# Patient Record
Sex: Female | Born: 1995 | Race: Black or African American | Hispanic: No | Marital: Single | Smoking: Former smoker
Health system: Southern US, Community
[De-identification: ages and names within clinical notes are randomized; demographics above are authoritative.]

## PROBLEM LIST (undated history)

## (undated) ENCOUNTER — Inpatient Hospital Stay (HOSPITAL_COMMUNITY): Payer: Self-pay

## (undated) ENCOUNTER — Inpatient Hospital Stay: Payer: Self-pay

## (undated) DIAGNOSIS — J45909 Unspecified asthma, uncomplicated: Secondary | ICD-10-CM

## (undated) DIAGNOSIS — B009 Herpesviral infection, unspecified: Secondary | ICD-10-CM

## (undated) HISTORY — PX: ARM WOUND REPAIR / CLOSURE: SUR1141

---
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
## 2016-01-12 ENCOUNTER — Emergency Department (HOSPITAL_COMMUNITY)
Admission: EM | Admit: 2016-01-12 | Discharge: 2016-01-13 | Disposition: A | Discharge status: Home / Self Care | Payer: Medicaid Other | Attending: Emergency Medicine | Admitting: Emergency Medicine

## 2016-01-12 ENCOUNTER — Encounter (HOSPITAL_COMMUNITY): Payer: Self-pay | Admitting: Emergency Medicine

## 2016-01-12 DIAGNOSIS — J45909 Unspecified asthma, uncomplicated: Secondary | ICD-10-CM | POA: Insufficient documentation

## 2016-01-12 DIAGNOSIS — Z87891 Personal history of nicotine dependence: Secondary | ICD-10-CM | POA: Insufficient documentation

## 2016-01-12 DIAGNOSIS — R112 Nausea with vomiting, unspecified: Secondary | ICD-10-CM | POA: Insufficient documentation

## 2016-01-12 DIAGNOSIS — R1033 Periumbilical pain: Secondary | ICD-10-CM

## 2016-01-12 DIAGNOSIS — N9489 Other specified conditions associated with female genital organs and menstrual cycle: Secondary | ICD-10-CM | POA: Insufficient documentation

## 2016-01-12 LAB — URINALYSIS, ROUTINE W REFLEX MICROSCOPIC
BACTERIA UA: NONE SEEN
BILIRUBIN URINE: NEGATIVE
GLUCOSE, UA: NEGATIVE mg/dL
Ketones, ur: 20 mg/dL — AB
LEUKOCYTES UA: NEGATIVE
NITRITE: NEGATIVE
PH: 5 (ref 5.0–8.0)
Protein, ur: 30 mg/dL — AB
SPECIFIC GRAVITY, URINE: 1.029 (ref 1.005–1.030)

## 2016-01-12 LAB — COMPREHENSIVE METABOLIC PANEL
ALT: 15 U/L (ref 14–54)
ANION GAP: 10 (ref 5–15)
AST: 20 U/L (ref 15–41)
Albumin: 4.5 g/dL (ref 3.5–5.0)
Alkaline Phosphatase: 19 U/L — ABNORMAL LOW (ref 38–126)
BILIRUBIN TOTAL: 0.9 mg/dL (ref 0.3–1.2)
BUN: 6 mg/dL (ref 6–20)
CO2: 26 mmol/L (ref 22–32)
Calcium: 9.5 mg/dL (ref 8.9–10.3)
Chloride: 105 mmol/L (ref 101–111)
Creatinine, Ser: 0.75 mg/dL (ref 0.44–1.00)
GFR calc Af Amer: >60 mL/min (ref >60)
Glucose, Bld: 85 mg/dL (ref 65–99)
POTASSIUM: 3.5 mmol/L (ref 3.5–5.1)
Sodium: 141 mmol/L (ref 135–145)
TOTAL PROTEIN: 7.4 g/dL (ref 6.5–8.1)

## 2016-01-12 LAB — HCG, QUANTITATIVE, PREGNANCY

## 2016-01-12 LAB — CBC
HEMATOCRIT: 38.3 % (ref 36.0–46.0)
Hemoglobin: 13.3 g/dL (ref 12.0–15.0)
MCH: 30 pg (ref 26.0–34.0)
MCHC: 34.7 g/dL (ref 30.0–36.0)
MCV: 86.3 fL (ref 78.0–100.0)
Platelets: 252 10*3/uL (ref 150–400)
RBC: 4.44 MIL/uL (ref 3.87–5.11)
RDW: 12.3 % (ref 11.5–15.5)
WBC: 6.9 10*3/uL (ref 4.0–10.5)

## 2016-01-12 LAB — LIPASE, BLOOD: Lipase: 27 U/L (ref 11–51)

## 2016-01-12 LAB — RAPID STREP SCREEN (MED CTR MEBANE ONLY): Streptococcus, Group A Screen (Direct): NEGATIVE

## 2016-01-12 NOTE — ED Triage Notes (Signed)
Patient reports mid abdominal pain with nausea onset today , pt. added sore throat. Denies fever or diarrhea.

## 2016-01-13 MED ORDER — ONDANSETRON HCL 4 MG/2ML IJ SOLN
4.0000 mg | Freq: Once | INTRAMUSCULAR | Status: AC
  Administered 2016-01-13: 4 mg via INTRAVENOUS
  Filled 2016-01-13: qty 2

## 2016-01-13 MED ORDER — GI COCKTAIL ~~LOC~~
30.0000 mL | Freq: Once | ORAL | Status: AC
  Administered 2016-01-13: 30 mL via ORAL
  Filled 2016-01-13: qty 30

## 2016-01-13 MED ORDER — OMEPRAZOLE 20 MG PO CPDR: 20.0000 mg | DELAYED_RELEASE_CAPSULE | Freq: Every day | ORAL | 0 refills | Status: DC

## 2016-01-13 MED ORDER — DICYCLOMINE HCL 10 MG PO CAPS
10.0000 mg | ORAL_CAPSULE | Freq: Once | ORAL | Status: AC
  Administered 2016-01-13: 10 mg via ORAL
  Filled 2016-01-13: qty 1

## 2016-01-13 MED ORDER — ONDANSETRON 4 MG PO TBDP: 4.0000 mg | ORAL_TABLET | Freq: Three times a day (TID) | ORAL | 0 refills | Status: DC | PRN

## 2016-01-13 MED ORDER — DICYCLOMINE HCL 20 MG PO TABS: 20.0000 mg | ORAL_TABLET | Freq: Two times a day (BID) | ORAL | 0 refills | Status: DC

## 2016-01-13 MED ORDER — SODIUM CHLORIDE 0.9 % IV BOLUS (SEPSIS)
1000.0000 mL | Freq: Once | INTRAVENOUS | Status: AC
  Administered 2016-01-13: 1000 mL via INTRAVENOUS

## 2016-01-13 NOTE — ED Provider Notes (Signed)
[facility]EM[facility]Y DEPT Provider Note   CSN: [id number] Arrival date & time: 01/12/16  2105     History   Chief Complaint Chief Complaint  Patient presents with  . Abdominal Pain  . Sore Throat    HPI Jaclyn Owens is a 20 y.o. female.  Jaclyn GuadeloupeYaKwanda Lipinski is a 20 y.o. Female who presents to the ED complaining of midabdominal pain since yesterday with associated nausea and vomiting beginning today. Patient reports her symptoms began with midabdominal pain that is nonradiating and constant. She does report some burping and belching. She tells me today she's had 2 episodes of vomiting with some nausea. No treatments attempted prior to arrival. No previous abdominal surgeries. She did have a recent pregnancy in June. She is not breast-feeding. The patient denies fevers, diarrhea, urinary symptoms, hematuria, cough, chest pain, shortness of breath, hematemesis, hematochezia, vaginal bleeding, vaginal discharge, lower abdominal pain or rashes.   The history is provided by the patient. No language interpreter was used.  Abdominal Pain   Associated symptoms include nausea and vomiting. Pertinent negatives include fever, diarrhea, dysuria, frequency, hematuria and headaches.  Sore Throat  Associated symptoms include abdominal pain. Pertinent negatives include no chest pain, no headaches and no shortness of breath.    Past Medical History:  Diagnosis Date  . Asthma     Patient Active Problem List   Diagnosis Date Noted  . Normal labor and delivery 08/06/2015  . Abdominal pain affecting pregnancy 06/02/2015  . Pregnancy 04/14/2015  . High risk pregnancy with low PAPPA (pregnancy-associated plasma protein A) 02/17/2015  . First trimester screening 02/10/2015    Past Surgical History:  Procedure Laterality Date  . ARM WOUND REPAIR / CLOSURE      OB History    Gravida Para Term Preterm AB Living   1         0   SAB TAB Ectopic Multiple Live Births                   Home  Medications    Prior to Admission medications   Medication Sig Start Date End Date Taking? Authorizing Provider  dicyclomine (BENTYL) 20 MG tablet Take 1 tablet (20 mg total) by mouth 2 (two) times daily. 01/13/16   Everlene FarrierWilliam Akirra Lacerda, PA-C  ibuprofen (ADVIL,MOTRIN) 600 MG tablet Take 1 tablet (600 mg total) by mouth every 6 (six) hours. Patient not taking: Reported on 01/12/2016 08/07/15   Elenora Fenderhelsea C Ward, MD  omeprazole (PRILOSEC) 20 MG capsule Take 1 capsule (20 mg total) by mouth daily. 01/13/16   Everlene FarrierWilliam Emmet Messer, PA-C  ondansetron (ZOFRAN ODT) 4 MG disintegrating tablet Take 1 tablet (4 mg total) by mouth every 8 (eight) hours as needed for nausea or vomiting. 01/13/16   Everlene FarrierWilliam Tiki Tucciarone, PA-C    Family History No family history on file.  Social History Social History  Substance Use Topics  . Smoking status: Former Smoker    Types: Cigarettes  . Smokeless tobacco: Never Used  . Alcohol use No     Allergies   Bee venom   Review of Systems Review of Systems  Constitutional: Negative for chills and fever.  HENT: Negative for congestion.   Eyes: Negative for visual disturbance.  Respiratory: Negative for cough, shortness of breath and wheezing.   Cardiovascular: Negative for chest pain.  Gastrointestinal: Positive for abdominal pain, nausea and vomiting. Negative for blood in stool and diarrhea.  Genitourinary: Negative for difficulty urinating, dysuria, flank pain, frequency, hematuria, pelvic pain, urgency, vaginal bleeding  and vaginal discharge.  Musculoskeletal: Negative for back pain and neck pain.  Skin: Negative for rash.  Neurological: Negative for headaches.     Physical Exam Updated Vital Signs BP 113/84   Pulse 76   Temp 98 F (36.7 C) (Oral)   Resp 19   LMP 01/11/2016   SpO2 99%   Physical Exam  Constitutional: She appears well-developed and well-nourished. No distress.  Nontoxic appearing.  HENT:  Head: Normocephalic and atraumatic.  Mouth/Throat:  Oropharynx is clear and moist.  Eyes: Conjunctivae are normal. Pupils are equal, round, and reactive to light. Right eye exhibits no discharge. Left eye exhibits no discharge.  Neck: Neck supple.  Cardiovascular: Normal rate, regular rhythm, normal heart sounds and intact distal pulses.  Exam reveals no gallop and no friction rub.   No murmur heard. Pulmonary/Chest: Effort normal and breath sounds normal. No respiratory distress. She has no wheezes. She has no rales.  Abdominal: Soft. Bowel sounds are normal. She exhibits no distension and no mass. There is tenderness. There is no rebound and no guarding.  Abdomen is soft. Bowel sounds are present. Patient has mild midabdominal and epigastric abdominal tenderness to palpation. No lower abdominal tenderness to palpation. No peritoneal signs. No CVA or flank tenderness.  Musculoskeletal: She exhibits no edema.  Lymphadenopathy:    She has no cervical adenopathy.  Neurological: She is alert. Coordination normal.  Skin: Skin is warm and dry. Capillary refill takes less than 2 seconds. No rash noted. She is not diaphoretic. No erythema. No pallor.  Psychiatric: She has a normal mood and affect. Her behavior is normal.  Nursing note and vitals reviewed.    ED Treatments / Results  Labs (all labs ordered are listed, but only abnormal results are displayed) Labs Reviewed  COMPREHENSIVE METABOLIC PANEL - Abnormal; Notable for the following:       Result Value   Alkaline Phosphatase 19 (*)    All other components within normal limits  URINALYSIS, ROUTINE W REFLEX MICROSCOPIC - Abnormal; Notable for the following:    Hgb urine dipstick LARGE (*)    Ketones, ur 20 (*)    Protein, ur 30 (*)    Squamous Epithelial / LPF 0-5 (*)    All other components within normal limits  RAPID STREP SCREEN (NOT AT [facility])  CULTURE, GROUP A STREP ([facility])  LIPASE, BLOOD  CBC  HCG, QUANTITATIVE, PREGNANCY    EKG  EKG Interpretation None        Radiology No results found.  Procedures Procedures (including critical care time)  Medications Ordered in ED Medications  sodium chloride 0.9 % bolus 1,000 mL (0 mLs Intravenous Stopped 01/13/16 0112)  ondansetron (ZOFRAN) injection 4 mg (4 mg Intravenous Given 01/13/16 0043)  gi cocktail (Maalox,Lidocaine,Donnatal) (30 mLs Oral Given 01/13/16 0043)  dicyclomine (BENTYL) capsule 10 mg (10 mg Oral Given 01/13/16 0043)     Initial Impression / Assessment and Plan / ED Course  I have reviewed the triage vital signs and the nursing notes.  Pertinent labs & imaging results that were available during my care of the patient were reviewed by me and considered in my medical decision making (see chart for details).  Clinical Course    This is a 20 y.o. Female who presents to the ED complaining of midabdominal pain since yesterday with associated nausea and vomiting beginning today. Patient reports her symptoms began with midabdominal pain that is nonradiating and constant. She does report some burping and belching. She tells me  today she's had 2 episodes of vomiting with some nausea. No treatments attempted prior to arrival. No previous abdominal surgeries. She did have a recent pregnancy in June. She is not breast-feeding.  On exam the patient is afebrile nontoxic appearing. Her abdomen is soft and she has mild mid and epigastric about tenderness to palpation. No right upper quadrant tenderness. No lower abdominal tenderness. No peritoneal signs. Pregnancy test is negative. CBC and CMP are unremarkable. Urinalysis without signs of infection. Lipase is within normal limits. Patient received fluid bolus, Bentyl and GI cocktail. At recheck patient reports she is feeling much better. She is tolerating by mouth. She feels ready for discharge. Suspected an aspect of acid reflux with her pain. No need for imaging today. Will discharge with rx for Zofran, Bentyl and omeprazole. Discussed strict and  specific return precautions. I advised the patient to follow-up with their primary care provider this week. I advised the patient to return to the emergency department with new or worsening symptoms or new concerns. The patient verbalized understanding and agreement with plan.     Final Clinical Impressions(s) / ED Diagnoses   Final diagnoses:  Non-intractable vomiting with nausea, unspecified vomiting type  Periumbilical abdominal pain    New Prescriptions New Prescriptions   DICYCLOMINE (BENTYL) 20 MG TABLET    Take 1 tablet (20 mg total) by mouth 2 (two) times daily.   OMEPRAZOLE (PRILOSEC) 20 MG CAPSULE    Take 1 capsule (20 mg total) by mouth daily.   ONDANSETRON (ZOFRAN ODT) 4 MG DISINTEGRATING TABLET    Take 1 tablet (4 mg total) by mouth every 8 (eight) hours as needed for nausea or vomiting.     Everlene FarrierWilliam Munira Polson, PA-C 01/13/16 0119    Layla MawKristen N Ward, DO 01/13/16 [id number]

## 2016-01-13 NOTE — ED Notes (Signed)
ED Provider at bedside. 

## 2016-01-13 NOTE — ED Notes (Signed)
Pt requested that IV be removed and would like to leave at this time. MD notified.

## 2016-01-15 LAB — CULTURE, GROUP A STREP (THRC)

## 2016-04-10 ENCOUNTER — Emergency Department (HOSPITAL_COMMUNITY)
Admission: EM | Admit: 2016-04-10 | Discharge: 2016-04-11 | Disposition: A | Discharge status: Home / Self Care | Payer: Medicaid Other | Attending: Emergency Medicine | Admitting: Emergency Medicine

## 2016-04-10 ENCOUNTER — Emergency Department (HOSPITAL_COMMUNITY): Payer: Medicaid Other

## 2016-04-10 ENCOUNTER — Encounter (HOSPITAL_COMMUNITY): Payer: Self-pay | Admitting: Emergency Medicine

## 2016-04-10 ENCOUNTER — Other Ambulatory Visit: Payer: Self-pay

## 2016-04-10 DIAGNOSIS — J45909 Unspecified asthma, uncomplicated: Secondary | ICD-10-CM | POA: Diagnosis not present

## 2016-04-10 DIAGNOSIS — R0789 Other chest pain: Secondary | ICD-10-CM | POA: Diagnosis not present

## 2016-04-10 DIAGNOSIS — Z79899 Other long term (current) drug therapy: Secondary | ICD-10-CM | POA: Diagnosis not present

## 2016-04-10 DIAGNOSIS — R079 Chest pain, unspecified: Secondary | ICD-10-CM | POA: Diagnosis present

## 2016-04-10 DIAGNOSIS — Z202 Contact with and (suspected) exposure to infections with a predominantly sexual mode of transmission: Secondary | ICD-10-CM | POA: Diagnosis not present

## 2016-04-10 DIAGNOSIS — Z87891 Personal history of nicotine dependence: Secondary | ICD-10-CM | POA: Insufficient documentation

## 2016-04-10 LAB — URINALYSIS, ROUTINE W REFLEX MICROSCOPIC
Bacteria, UA: NONE SEEN
Bilirubin Urine: NEGATIVE
Glucose, UA: NEGATIVE mg/dL
KETONES UR: 5 mg/dL — AB
Leukocytes, UA: NEGATIVE
Nitrite: NEGATIVE
PH: 6 (ref 5.0–8.0)
Protein, ur: NEGATIVE mg/dL
Specific Gravity, Urine: 1.025 (ref 1.005–1.030)

## 2016-04-10 LAB — I-STAT TROPONIN, ED
TROPONIN I, POC: 0 ng/mL (ref 0.00–0.08)
Troponin i, poc: 0 ng/mL (ref 0.00–0.08)

## 2016-04-10 LAB — I-STAT BETA HCG BLOOD, ED (MC, WL, AP ONLY)

## 2016-04-10 LAB — BASIC METABOLIC PANEL
Anion gap: 11 (ref 5–15)
BUN: 6 mg/dL (ref 6–20)
CALCIUM: 9 mg/dL (ref 8.9–10.3)
CO2: 23 mmol/L (ref 22–32)
CREATININE: 0.72 mg/dL (ref 0.44–1.00)
Chloride: 106 mmol/L (ref 101–111)
Glucose, Bld: 86 mg/dL (ref 65–99)
Potassium: 3.4 mmol/L — ABNORMAL LOW (ref 3.5–5.1)
SODIUM: 140 mmol/L (ref 135–145)

## 2016-04-10 LAB — CBC
HCT: 36.4 % (ref 36.0–46.0)
Hemoglobin: 12.4 g/dL (ref 12.0–15.0)
MCH: 28.9 pg (ref 26.0–34.0)
MCHC: 34.1 g/dL (ref 30.0–36.0)
MCV: 84.8 fL (ref 78.0–100.0)
PLATELETS: 222 10*3/uL (ref 150–400)
RBC: 4.29 MIL/uL (ref 3.87–5.11)
RDW: 12.7 % (ref 11.5–15.5)
WBC: 5.2 10*3/uL (ref 4.0–10.5)

## 2016-04-10 LAB — D-DIMER, QUANTITATIVE: D-Dimer, Quant: 0.36 ug/mL-FEU (ref 0.00–0.50)

## 2016-04-10 MED ORDER — GI COCKTAIL ~~LOC~~
30.0000 mL | Freq: Once | ORAL | Status: AC
  Administered 2016-04-10: 30 mL via ORAL
  Filled 2016-04-10: qty 30

## 2016-04-10 NOTE — ED Notes (Signed)
Pt stated she felt hot. Pt temp 99.2

## 2016-04-10 NOTE — ED Notes (Signed)
ED Provider at bedside. 

## 2016-04-10 NOTE — ED Notes (Signed)
Attempted to obtain pts vitals, pt did not respond when called. x3.

## 2016-04-10 NOTE — ED Triage Notes (Signed)
Has had cp x 1 month and it comes and goes today it hurts to take a deep breath and she needs checked for std states her partner states that he had been unfaithful, states no s/s  Denies d/c or dysuria

## 2016-04-11 MED ORDER — OMEPRAZOLE 20 MG PO CPDR: 20.0000 mg | DELAYED_RELEASE_CAPSULE | Freq: Every day | ORAL | 0 refills | Status: DC

## 2016-04-11 MED ORDER — ACETAMINOPHEN 325 MG PO TABS: 650.0000 mg | ORAL_TABLET | Freq: Four times a day (QID) | ORAL | 0 refills | Status: DC | PRN

## 2016-04-11 NOTE — ED Notes (Signed)
Pt verbalized understanding of d/c instructions and has no further questions. Pt is stable, A&Ox4, VSS.  

## 2016-04-11 NOTE — ED Notes (Signed)
[phone] pt's personal number.

## 2016-04-11 NOTE — Discharge Instructions (Signed)
We saw you in the ER for the chest pain.  All of our cardiac workup is normal, including labs, EKG and chest X-RAY are normal. We also screened you for a blood clot - and that test is normal as well.  We are not sure what is causing your discomfort, but we feel comfortable sending you home at this time. The workup in the ER is not complete, and you should follow up with your primary care doctor for further evaluation.

## 2016-04-12 NOTE — ED Provider Notes (Signed)
[facility] Provider Note   CSN: [id number] Arrival date & time: 04/10/16  1431     History   Chief Complaint Chief Complaint  Patient presents with  . Chest Pain  . Exposure to STD    HPI Jaclyn Owens is a 21 y.o. female.  HPI Pt comes in with cc of chest pain. Pt reports that she has been having anterior chest pain, and back pain for the past few days. Pain is severe. Pt has some dib associated with it. Pt has no hx of PE, DVT and denies any exogenous hormone (testosterone / estrogen) use, long distance travels or surgery in the past 6 weeks, active cancer, recent immobilization. Pt doesn't use cocaine.   It wasn't until after her discharge that I noted that pt also had complained of wanting to get checked for STD. She never brought this concern to me during her ER stay, and so I never questioned her about her concerns for STD or checked her for STD.  Past Medical History:  Diagnosis Date  . Asthma     Patient Active Problem List   Diagnosis Date Noted  . Normal labor and delivery 08/06/2015  . Abdominal pain affecting pregnancy 06/02/2015  . Pregnancy 04/14/2015  . High risk pregnancy with low PAPPA (pregnancy-associated plasma protein A) 02/17/2015  . First trimester screening 02/10/2015    Past Surgical History:  Procedure Laterality Date  . ARM WOUND REPAIR / CLOSURE      OB History    Gravida Para Term Preterm AB Living   1         0   SAB TAB Ectopic Multiple Live Births                   Home Medications    Prior to Admission medications   Medication Sig Start Date End Date Taking? Authorizing Provider  acetaminophen (TYLENOL) 325 MG tablet Take 2 tablets (650 mg total) by mouth every 6 (six) hours as needed. 04/11/16   Derwood KaplanAnkit Carren Blakley, MD  dicyclomine (BENTYL) 20 MG tablet Take 1 tablet (20 mg total) by mouth 2 (two) times daily. Patient not taking: Reported on 04/10/2016 01/13/16   Everlene FarrierWilliam Dansie, PA-C  ibuprofen (ADVIL,MOTRIN) 600 MG  tablet Take 1 tablet (600 mg total) by mouth every 6 (six) hours. Patient not taking: Reported on 04/10/2016 08/07/15   Elenora Fenderhelsea C Ward, MD  omeprazole (PRILOSEC) 20 MG capsule Take 1 capsule (20 mg total) by mouth daily. 04/11/16   Derwood KaplanAnkit Reyaan Thoma, MD  ondansetron (ZOFRAN ODT) 4 MG disintegrating tablet Take 1 tablet (4 mg total) by mouth every 8 (eight) hours as needed for nausea or vomiting. Patient not taking: Reported on 04/10/2016 01/13/16   Everlene FarrierWilliam Dansie, PA-C    Family History No family history on file.  Social History Social History  Substance Use Topics  . Smoking status: Former Smoker    Types: Cigarettes  . Smokeless tobacco: Never Used  . Alcohol use No     Allergies   Bee venom   Review of Systems Review of Systems  ROS 10 Systems reviewed and are negative for acute change except as noted in the HPI.     Physical Exam Updated Vital Signs BP 132/99   Pulse 83   Temp 99.3 F (37.4 C) (Oral)   Resp 25   SpO2 98%   Physical Exam  Constitutional: She is oriented to person, place, and time. She appears well-developed and well-nourished.  HENT:  Head: Normocephalic  and atraumatic.  Eyes: EOM are normal. Pupils are equal, round, and reactive to light.  Neck: Neck supple.  Cardiovascular: Normal rate, regular rhythm, normal heart sounds and intact distal pulses.   No murmur heard. Pulmonary/Chest: Effort normal. No respiratory distress.  Abdominal: Soft. She exhibits no distension. There is no tenderness. There is no rebound and no guarding.  Neurological: She is alert and oriented to person, place, and time.  Skin: Skin is warm and dry.  Nursing note and vitals reviewed.    ED Treatments / Results  Labs (all labs ordered are listed, but only abnormal results are displayed) Labs Reviewed  BASIC METABOLIC PANEL - Abnormal; Notable for the following:       Result Value   Potassium 3.4 (*)    All other components within normal limits  URINALYSIS, ROUTINE  W REFLEX MICROSCOPIC - Abnormal; Notable for the following:    APPearance HAZY (*)    Hgb urine dipstick MODERATE (*)    Ketones, ur 5 (*)    Squamous Epithelial / LPF 0-5 (*)    All other components within normal limits  CBC  D-DIMER, QUANTITATIVE (NOT AT [facility])  I-STAT TROPOININ, ED  I-STAT BETA HCG BLOOD, ED ([facility], [facility], [facility] ONLY)  I-STAT TROPOININ, ED    EKG  EKG Interpretation  Date/Time:  Tuesday April 10 2016 14:41:04 EDT Ventricular Rate:  82 PR Interval:  116 QRS Duration: 68 QT Interval:  374 QTC Calculation: 436 R Axis:   68 Text Interpretation:  Normal sinus rhythm with sinus arrhythmia Nonspecific T wave abnormality Abnormal ECG No old tracing to compare Confirmed by Rhunette Croft, MD, Janey Genta [id number]) on 04/10/2016 7:50:14 PM       Radiology Dg Chest 2 View  Result Date: 04/10/2016 CLINICAL DATA:  21 y/o  F; chest pain.  History of asthma. EXAM: CHEST  2 VIEW COMPARISON:  None. FINDINGS: Normal cardiac silhouette. Mild bronchitic markings. No focal consolidation. No pleural effusion or pneumothorax. Mild levocurvature of the lower thoracic spine. IMPRESSION: Mild bronchitic changes probably represents reactive airways disease or bronchitis. No focal consolidation. Mild lower thoracic spine levocurvature. Electronically Signed   By: Mitzi Hansen M.D.   On: 04/10/2016 15:26    Procedures Procedures (including critical care time)  Medications Ordered in ED Medications  gi cocktail (Maalox,Lidocaine,Donnatal) (30 mLs Oral Given 04/10/16 2043)     Initial Impression / Assessment and Plan / ED Course  I have reviewed the triage vital signs and the nursing notes.  Pertinent labs & imaging results that were available during my care of the patient were reviewed by me and considered in my medical decision making (see chart for details).  Clinical Course as of Apr 12 328  Wed Apr 11, 2016  0022 Pt reports that her chest pain has resolved. Trops x 2 and dimer is  neg. We will discharge.  [AN]  0022 Patient reassessed. Pt is comfortable at this time.  Results of the workup discussed. Strict ER return precautions discussed. Follow up instruction discussed, and pt agrees with the plan and is comfortable with it.   [AN]    Clinical Course User Index [AN] Derwood Kaplan, MD    Pt comes in with cc of chest pain. Pt has chest pain and back pain. No family hx of premature CAD. Pt has no hx of cocaine use. Pulse exam normal. Concerns for dissection low. Dimer ordered - and it is neg, further reducing chances for PE or  Dissection. trops x 2  ordered - and is neg. Final Clinical Impressions(s) / ED Diagnoses   Final diagnoses:  Atypical chest pain    New Prescriptions Discharge Medication List as of 04/11/2016 12:27 AM    START taking these medications   Details  acetaminophen (TYLENOL) 325 MG tablet Take 2 tablets (650 mg total) by mouth every 6 (six) hours as needed., Starting Wed 04/11/2016, Print         Derwood Kaplan, MD 04/12/16 [phone]

## 2016-05-23 ENCOUNTER — Encounter (HOSPITAL_COMMUNITY): Payer: Self-pay | Admitting: Emergency Medicine

## 2016-05-23 ENCOUNTER — Emergency Department (HOSPITAL_COMMUNITY)
Admission: EM | Admit: 2016-05-23 | Discharge: 2016-05-23 | Disposition: A | Discharge status: Home / Self Care | Payer: Medicaid Other | Attending: Emergency Medicine | Admitting: Emergency Medicine

## 2016-05-23 DIAGNOSIS — Z87891 Personal history of nicotine dependence: Secondary | ICD-10-CM | POA: Diagnosis not present

## 2016-05-23 DIAGNOSIS — Z113 Encounter for screening for infections with a predominantly sexual mode of transmission: Secondary | ICD-10-CM | POA: Diagnosis not present

## 2016-05-23 LAB — WET PREP, GENITAL
SPERM: NONE SEEN
Trich, Wet Prep: NONE SEEN
Yeast Wet Prep HPF POC: NONE SEEN

## 2016-05-23 NOTE — ED Notes (Signed)
Pelvic cart set up at bedside if needed.

## 2016-05-23 NOTE — ED Triage Notes (Signed)
Pt had sex without condom and would like to be checked for STDs.

## 2016-05-23 NOTE — ED Notes (Signed)
Pt still states she is unable to urinate.  Giving patient water to drink.

## 2016-05-23 NOTE — ED Notes (Signed)
Per EDP, patient is going to take home pregnancy test.

## 2016-05-23 NOTE — ED Notes (Signed)
Pt has no complaints other than wanting to be checked for STDs due to exposure.

## 2016-05-23 NOTE — ED Provider Notes (Signed)
[facility]PT Provider Note   CSN: [id number] Arrival date & time: 05/23/16  1750  By signing my name below, I, Jaclyn Owens, attest that this documentation has been prepared under the direction and in the presence of Jaclyn Monday, MD. Electronically Signed: Christian Owens, Scribe. 05/23/2016. 6:33 PM.  History   Chief Complaint Chief Complaint  Patient presents with  . Exposure to STD   The history is provided by the patient and medical records. No language interpreter was used.    HPI Comments:  Jaclyn Owens is a 21 y.o. female with a PMHx of Asthma, who presents to the Emergency Department requesting to be checked for an STD. Pt reports having unprotected sex two weeks ago. She states since the birth of her son, she has had her menstrual cycle twice in a month pretty consistently. This month has been different from that with a little blood noted this past week. She denies any pain or any symptoms. No specific STD exposure known, last partner denied having any type of STD. Pt denies abdominal pain, vaginal discharge, vaginal bleeding, rash, urgency, frequency, hematuria, dysuria, difficulty urinating, and any other complaints at this time. Pt does not currently have a PCP.    Past Medical History:  Diagnosis Date  . Asthma    Patient Active Problem List   Diagnosis Date Noted  . Normal labor and delivery 08/06/2015  . Abdominal pain affecting pregnancy 06/02/2015  . Pregnancy 04/14/2015  . High risk pregnancy with low PAPPA (pregnancy-associated plasma protein A) 02/17/2015  . First trimester screening 02/10/2015   Past Surgical History:  Procedure Laterality Date  . ARM WOUND REPAIR / CLOSURE     OB History    Gravida Para Term Preterm AB Living   1         0   SAB TAB Ectopic Multiple Live Births                 Home Medications    Prior to Admission medications   Medication Sig Start Date End Date Taking? Authorizing Provider  acetaminophen  (TYLENOL) 325 MG tablet Take 2 tablets (650 mg total) by mouth every 6 (six) hours as needed. 04/11/16   Derwood Kaplan, MD  dicyclomine (BENTYL) 20 MG tablet Take 1 tablet (20 mg total) by mouth 2 (two) times daily. Patient not taking: Reported on 04/10/2016 01/13/16   Everlene Farrier, PA-C  ibuprofen (ADVIL,MOTRIN) 600 MG tablet Take 1 tablet (600 mg total) by mouth every 6 (six) hours. Patient not taking: Reported on 04/10/2016 08/07/15   Elenora Fender Ward, MD  omeprazole (PRILOSEC) 20 MG capsule Take 1 capsule (20 mg total) by mouth daily. 04/11/16   Derwood Kaplan, MD  ondansetron (ZOFRAN ODT) 4 MG disintegrating tablet Take 1 tablet (4 mg total) by mouth every 8 (eight) hours as needed for nausea or vomiting. Patient not taking: Reported on 04/10/2016 01/13/16   Everlene Farrier, PA-C   Family History History reviewed. No pertinent family history.  Social History Social History  Substance Use Topics  . Smoking status: Former Smoker    Types: Cigarettes  . Smokeless tobacco: Never Used  . Alcohol use No   Allergies   Bee venom   Review of Systems Review of Systems  HENT: Negative for ear pain.   Eyes: Negative for pain.  Gastrointestinal: Negative for abdominal pain.  Genitourinary: Negative for difficulty urinating, dysuria, flank pain, frequency, hematuria, urgency, vaginal bleeding and vaginal discharge.  Musculoskeletal: Negative for back  pain and myalgias.  Skin: Negative for rash.     Physical Exam Updated Vital Signs BP (!) 134/114 (BP Location: Right Arm)   Pulse (!) 113   Temp 98.4 F (36.9 C) (Oral)   Resp 18   Ht  (1.676 m)   Wt 138 lb (62.6 kg)   LMP 05/03/2016 (Approximate)   SpO2 99%   BMI 22.27 kg/m   Physical Exam  Constitutional: She is oriented to person, place, and time. She appears well-developed and well-nourished.  HENT:  Head: Normocephalic.  Eyes: Conjunctivae are normal.  Cardiovascular: Normal rate.   Pulmonary/Chest: Effort normal.    Abdominal: She exhibits no distension.  Genitourinary: Uterus normal. Pelvic exam was performed with patient supine. Uterus is not tender. Cervix exhibits no discharge. Right adnexum displays no tenderness. Left adnexum displays no tenderness. No tenderness in the vagina. No vaginal discharge found.  Musculoskeletal: Normal range of motion.  Neurological: She is alert and oriented to person, place, and time.  Skin: Skin is warm and dry.  Psychiatric: She has a normal mood and affect.  Nursing note and vitals reviewed.   CHAPERONE PRESENT FOR PHYSICAL EXAMINATION  ED Treatments / Results  DIAGNOSTIC STUDIES:  Oxygen Saturation is 99% on RA, normal by my interpretation.    COORDINATION OF CARE:  6:32 PM Discussed treatment plan with pt at bedside including lab tests and pt agreed to plan.  Labs (all labs ordered are listed, but only abnormal results are displayed) Labs Reviewed  WET PREP, GENITAL - Abnormal; Notable for the following:       Result Value   Clue Cells Wet Prep HPF POC PRESENT (*)    WBC, Wet Prep HPF POC MANY (*)    All other components within normal limits  GC/CHLAMYDIA PROBE AMP ([place]) NOT AT [facility]    EKG  EKG Interpretation None       Radiology No results found.  Procedures Procedures (including critical care time)  Medications Ordered in ED Medications - No data to display   Initial Impression / Assessment and Plan / ED Course  I have reviewed the triage vital signs and the nursing notes.  Pertinent labs & imaging results that were available during my care of the patient were reviewed by me and considered in my medical decision making (see chart for details).     21 year old female presents with concern for unprotected sex and desire for screening for STI's.   Patient asymptomatic. No sign of abnormalities on exam. Offered HIV and syphilis testing, however she declines. Offered Empiric treatment of gonorrhea and chlamydia, however she  declines and would like to wait for results.  Wet prep shows clue cells. Will not treat given asymptomatic  Patient Does not have to urinate at this time, and after waiting, reports that she will just take a home pregnancy test. Given she is asymptomatic, not having abdominal pain, feel home pregnancy test is appropriate.  Patient discharged in stable condition with understanding of reasons to return.   Final Clinical Impressions(s) / ED Diagnoses   Final diagnoses:  Screening examination for STD (sexually transmitted disease)    New Prescriptions Discharge Medication List as of 05/23/2016  7:58 PM     I personally performed the services described in this documentation, which was scribed in my presence. The recorded information has been reviewed and is accurate.    Jaclyn Monday, MD 05/23/16 2226

## 2016-05-24 LAB — GC/CHLAMYDIA PROBE AMP (~~LOC~~) NOT AT ARMC
CHLAMYDIA, DNA PROBE: POSITIVE — AB
Neisseria Gonorrhea: POSITIVE — AB

## 2016-07-04 ENCOUNTER — Ambulatory Visit (INDEPENDENT_AMBULATORY_CARE_PROVIDER_SITE_OTHER): Payer: Medicaid Other | Admitting: *Deleted

## 2016-07-04 ENCOUNTER — Other Ambulatory Visit (HOSPITAL_COMMUNITY)
Admission: RE | Admit: 2016-07-04 | Discharge: 2016-07-04 | Disposition: A | Discharge status: Home / Self Care | Payer: Medicaid Other | Source: Ambulatory Visit | Attending: Family Medicine | Admitting: Family Medicine

## 2016-07-04 DIAGNOSIS — Z113 Encounter for screening for infections with a predominantly sexual mode of transmission: Secondary | ICD-10-CM | POA: Diagnosis not present

## 2016-07-04 DIAGNOSIS — Z7251 High risk heterosexual behavior: Secondary | ICD-10-CM

## 2016-07-04 DIAGNOSIS — Z3200 Encounter for pregnancy test, result unknown: Secondary | ICD-10-CM | POA: Insufficient documentation

## 2016-07-04 DIAGNOSIS — Z3202 Encounter for pregnancy test, result negative: Secondary | ICD-10-CM

## 2016-07-04 LAB — POCT PREGNANCY, URINE: Preg Test, Ur: NEGATIVE

## 2016-07-04 NOTE — Progress Notes (Signed)
Pt informed of negative pregnancy test today.  She states she has had 5 positive UPT's @ home - most recent test was today.  Pt showed the test kit to me and thought she was seeing a faint line to indicate positive result.  I advised pt that I read the test as negative and did not see any line indicating pregnancy. Her LMP is 07/02/16 but lighter than usual. Previous cycle began on 05/31/16. Pt also requested testing for STI due to recent unprotected sex. She believes her partner has had sex with someone "who sells her services". Self vaginal swab performed.  She will be called with results.  Pt voiced understanding.

## 2016-07-05 LAB — CERVICOVAGINAL ANCILLARY ONLY
CHLAMYDIA, DNA PROBE: NEGATIVE
Neisseria Gonorrhea: NEGATIVE
TRICH (WINDOWPATH): NEGATIVE

## 2016-07-06 ENCOUNTER — Encounter (HOSPITAL_COMMUNITY): Payer: Self-pay | Admitting: *Deleted

## 2016-07-06 ENCOUNTER — Inpatient Hospital Stay (EMERGENCY_DEPARTMENT_HOSPITAL)
Admission: AD | Admit: 2016-07-06 | Discharge: 2016-07-06 | Disposition: A | Discharge status: Home / Self Care | Payer: Medicaid Other | Source: Ambulatory Visit | Attending: Obstetrics & Gynecology | Admitting: Obstetrics & Gynecology

## 2016-07-06 ENCOUNTER — Emergency Department (HOSPITAL_COMMUNITY)
Admission: EM | Admit: 2016-07-06 | Discharge: 2016-07-07 | Disposition: A | Discharge status: Home / Self Care | Payer: Medicaid Other | Attending: Emergency Medicine | Admitting: Emergency Medicine

## 2016-07-06 ENCOUNTER — Encounter (HOSPITAL_COMMUNITY): Payer: Self-pay | Admitting: Emergency Medicine

## 2016-07-06 DIAGNOSIS — G43009 Migraine without aura, not intractable, without status migrainosus: Secondary | ICD-10-CM | POA: Diagnosis not present

## 2016-07-06 DIAGNOSIS — N766 Ulceration of vulva: Secondary | ICD-10-CM

## 2016-07-06 DIAGNOSIS — Z79899 Other long term (current) drug therapy: Secondary | ICD-10-CM | POA: Diagnosis not present

## 2016-07-06 DIAGNOSIS — H60391 Other infective otitis externa, right ear: Secondary | ICD-10-CM | POA: Diagnosis not present

## 2016-07-06 DIAGNOSIS — J45909 Unspecified asthma, uncomplicated: Secondary | ICD-10-CM | POA: Insufficient documentation

## 2016-07-06 DIAGNOSIS — H9201 Otalgia, right ear: Secondary | ICD-10-CM | POA: Diagnosis present

## 2016-07-06 DIAGNOSIS — Z87891 Personal history of nicotine dependence: Secondary | ICD-10-CM | POA: Insufficient documentation

## 2016-07-06 LAB — URINALYSIS, ROUTINE W REFLEX MICROSCOPIC
BILIRUBIN URINE: NEGATIVE
Bacteria, UA: NONE SEEN
Glucose, UA: NEGATIVE mg/dL
KETONES UR: NEGATIVE mg/dL
NITRITE: NEGATIVE
Protein, ur: NEGATIVE mg/dL
Specific Gravity, Urine: 1.018 (ref 1.005–1.030)
pH: 6 (ref 5.0–8.0)

## 2016-07-06 LAB — WET PREP, GENITAL
Clue Cells Wet Prep HPF POC: NONE SEEN
Sperm: NONE SEEN
Trich, Wet Prep: NONE SEEN
Yeast Wet Prep HPF POC: NONE SEEN

## 2016-07-06 MED ORDER — VALACYCLOVIR HCL 1 G PO TABS: 1000.0000 mg | ORAL_TABLET | Freq: Two times a day (BID) | ORAL | 0 refills | Status: AC

## 2016-07-06 NOTE — MAU Note (Signed)
Patient desires screening for herpes. States has multiple vaginal bumps.  Feels like being cut when she pees. +vaginal itching. States Partner does have herpes. +lower back cramping Rating pain 5/10

## 2016-07-06 NOTE — ED Notes (Signed)
Pt presents with C/O HA, R earache, eye itching. Was just seen [facility] and dx with genital herpes, the doctor over there addressed the issue about eye itching/ear ache and instructed her to take claritin and remove her fake eyelashes per MAU note. She came here bc she feels they could have helped her more with her eye itching and ear ache. A/OX4.

## 2016-07-06 NOTE — Discharge Instructions (Signed)
Genital Herpes Genital herpes is a common sexually transmitted infection (STI) that is caused by a virus. The virus spreads from person to person through sexual contact. Infection can cause itching, blisters, and sores around the genitals or rectum. Symptoms may last several days and then go away This is called an outbreak. However, the virus remains in your body, so you may have more outbreaks in the future. The time between outbreaks varies and can be months or years. Genital herpes affects men and women. It is particularly concerning for pregnant women because the virus can be passed to the baby during delivery and can cause serious problems. Genital herpes is also a concern for people who have a weak disease-fighting (immune) system. What are the causes? This condition is caused by the herpes simplex virus (HSV) type 1 or type 2. The virus may spread through:  Sexual contact with an infected person, including vaginal, anal, and oral sex.  Contact with fluid from a herpes sore.  The skin. This means that you can get herpes from an infected partner even if he or she does not have a visible sore or does not know that he or she is infected. What increases the risk? You are more likely to develop this condition if:  You have sex with many partners.  You do not use latex condoms during sex. What are the signs or symptoms? Most people do not have symptoms (asymptomatic) or have mild symptoms that may be mistaken for other skin problems. Symptoms may include:  Small red bumps near the genitals, rectum, or mouth. These bumps turn into blisters and then turn into sores.  Flu-like symptoms, including:  Fever.  Body aches.  Swollen lymph nodes.  Headache.  Painful urination.  Pain and itching in the genital area or rectal area.  Vaginal discharge.  Tingling or shooting pain in the legs and buttocks. Generally, symptoms are more severe and last longer during the first (primary)  outbreak. Flu-like symptoms are also more common during the primary outbreak. How is this diagnosed? Genital herpes may be diagnosed based on:  A physical exam.  Your medical history.  Blood tests.  A test of a fluid sample (culture) from an open sore. How is this treated? There is no cure for this condition, but treatment with antiviral medicines that are taken by mouth (orally) can do the following:  Speed up healing and relieve symptoms.  Help to reduce the spread of the virus to sexual partners.  Limit the chance of future outbreaks, or make future outbreaks shorter.  Lessen symptoms of future outbreaks. Your health care provider may also recommend pain relief medicines, such as aspirin or ibuprofen. Follow these instructions at home: Sexual activity   Do not have sexual contact during active outbreaks.  Practice safe sex. Latex condoms and female condoms may help prevent the spread of the herpes virus. General instructions   Keep the affected areas dry and clean.  Take over-the-counter and prescription medicines only as told by your health care provider.  Avoid rubbing or touching blisters and sores. If you do touch blisters or sores:  Wash your hands thoroughly with soap and water.  Do not touch your eyes afterward.  To help relieve pain or itching, you may take the following actions as directed by your health care provider:  Apply a cold, wet cloth (cold compress) to affected areas 4-6 times a day.  Apply a substance that protects your skin and reduces bleeding (astringent).  Apply a   gel that helps relieve pain around sores (lidocaine gel).  Take a warm, shallow bath that cleans the genital area (sitz bath).  Keep all follow-up visits as told by your health care provider. This is important. How is this prevented?  Use condoms. Although anyone can get genital herpes during sexual contact, even with the use of a condom, a condom can provide some  protection.  Avoid having multiple sexual partners.  Talk with your sexual partner about any symptoms either of you may have. Also, talk with your partner about any history of STIs.  Get tested for STIs before you have sex. Ask your partner to do the same.  Do not have sexual contact if you have symptoms of genital herpes. Contact a health care provider if:  Your symptoms are not improving with medicine.  Your symptoms return.  You have new symptoms.  You have a fever.  You have abdominal pain.  You have redness, swelling, or pain in your eye.  You notice new sores on other parts of your body.  You are a woman and experience bleeding between menstrual periods.  You have had herpes and you become pregnant or plan to become pregnant. Summary  Genital herpes is a common sexually transmitted infection (STI) that is caused by the herpes simplex virus (HSV) type 1 or type 2.  These viruses are most often spread through sexual contact with an infected person.  You are more likely to develop this condition if you have sex with many partners or you have unprotected sex.  Most people do not have symptoms (asymptomatic) or have mild symptoms that may be mistaken for other skin problems. Symptoms occur as outbreaks that may happen months or years apart.  There is no cure for this condition, but treatment with oral antiviral medicines can reduce symptoms, reduce the chance of spreading the virus to a partner, prevent future outbreaks, or shorten future outbreaks. This information is not intended to replace advice given to you by your health care provider. Make sure you discuss any questions you have with your health care provider. Document Released: 01/13/2000 Document Revised: 12/16/2015 Document Reviewed: 12/16/2015 Elsevier Interactive Patient Education  2017 [employer]  

## 2016-07-06 NOTE — MAU Provider Note (Signed)
History     CSN: [id number]  Arrival date and time: 07/06/16 1420   None     No chief complaint on file.  HPI Jaclyn Owens is 21 y.o. G1P1001 Presents for evaluation of vulvar ulcerations "towards my bottom" that began yesterday.  She reports her partner was diagnosed with HSV yesterday, he has Valtrex Rx. Denies fever, chills, lymph node enlargement. She states her eyes, back ache.  States she may have allergies.  She removed false eyelashes in the room, denies this is the cause. She was seen in [facility], had  Neg UPT and Neg GC/CHL on that day.     Past Medical History:  Diagnosis Date  . Asthma     Past Surgical History:  Procedure Laterality Date  . ARM WOUND REPAIR / CLOSURE      History reviewed. No pertinent family history.  Social History  Substance Use Topics  . Smoking status: Former Smoker    Types: Cigarettes  . Smokeless tobacco: Never Used  . Alcohol use No    Allergies:  Allergies  Allergen Reactions  . Bee Venom Swelling    Prescriptions Prior to Admission  Medication Sig Dispense Refill Last Dose  . acetaminophen (TYLENOL) 325 MG tablet Take 2 tablets (650 mg total) by mouth every 6 (six) hours as needed. 30 tablet 0   . dicyclomine (BENTYL) 20 MG tablet Take 1 tablet (20 mg total) by mouth 2 (two) times daily. (Patient not taking: Reported on 04/10/2016) 20 tablet 0 Not Taking at Unknown time  . ibuprofen (ADVIL,MOTRIN) 600 MG tablet Take 1 tablet (600 mg total) by mouth every 6 (six) hours. (Patient not taking: Reported on 04/10/2016) 65 tablet 0 Not Taking at Unknown time  . omeprazole (PRILOSEC) 20 MG capsule Take 1 capsule (20 mg total) by mouth daily. 60 capsule 0   . ondansetron (ZOFRAN ODT) 4 MG disintegrating tablet Take 1 tablet (4 mg total) by mouth every 8 (eight) hours as needed for nausea or vomiting. (Patient not taking: Reported on 04/10/2016) 10 tablet 0 Not Taking at Unknown time    Review of Systems  Constitutional: Negative  for chills, fatigue and fever.  Eyes: Positive for pain.  Respiratory: Negative for chest tightness and shortness of breath.   Cardiovascular: Negative for chest pain.  Gastrointestinal: Negative for abdominal pain.  Genitourinary: Positive for genital sores and vaginal bleeding (mense starting today). Negative for vaginal discharge and vaginal pain.       + for painful lesion near her "bottom".    Musculoskeletal: Positive for back pain.  Neurological: Negative for dizziness.   Physical Exam   Blood pressure 100/66, pulse 91, temperature 98.5 F (36.9 C), temperature source Oral, resp. rate 18, last menstrual period 07/02/2016, SpO2 100 %, unknown if currently breastfeeding.  Physical Exam  Nursing note and vitals reviewed. Constitutional: She is oriented to person, place, and time. She appears well-developed and well-nourished. No distress.  HENT:  Head: Normocephalic.  Eyes:  Slightly red bilaterally.  Patient just removed fake eyelashes but states they do not cause her eye pain.  Neck: Normal range of motion.  Cardiovascular: Normal rate.   Respiratory: Effort normal.  GI: There is no tenderness. There is no rebound and no guarding.  Genitourinary:    There is no rash, tenderness or lesion (there are 2 pin point size open lesions, very tender while collecting swab for culture.  Area has red edges with scant drainage. ) on the right labia. There  is no rash, tenderness or lesion on the left labia. Uterus is not enlarged and not tender. Cervix exhibits no motion tenderness, no discharge and no friability. Right adnexum displays no mass, no tenderness and no fullness. Left adnexum displays no mass, no tenderness and no fullness. There is erythema and bleeding (small amount of pink blood noted-day 1 of menses) in the vagina. Vaginal discharge (small amount of pink tinged discharge with mild odor.  Neg for lesions) found.  Lymphadenopathy:       Right: No inguinal adenopathy present.        Left: No inguinal adenopathy present.  Neurological: She is alert and oriented to person, place, and time.  Skin: Skin is warm and dry.  Psychiatric: She has a normal mood and affect. Her behavior is normal.   Results for orders placed or performed during the hospital encounter of 07/06/16 (from the past 24 hour(s))  Urinalysis, Routine w reflex microscopic     Status: Abnormal   Collection Time: 07/06/16  2:29 PM  Result Value Ref Range   Color, Urine YELLOW YELLOW   APPearance CLEAR CLEAR   Specific Gravity, Urine 1.018 1.005 - 1.030   pH 6.0 5.0 - 8.0   Glucose, UA NEGATIVE NEGATIVE mg/dL   Hgb urine dipstick LARGE (A) NEGATIVE   Bilirubin Urine NEGATIVE NEGATIVE   Ketones, ur NEGATIVE NEGATIVE mg/dL   Protein, ur NEGATIVE NEGATIVE mg/dL   Nitrite NEGATIVE NEGATIVE   Leukocytes, UA TRACE (A) NEGATIVE   RBC / HPF 6-30 0 - 5 RBC/hpf   WBC, UA 6-30 0 - 5 WBC/hpf   Bacteria, UA NONE SEEN NONE SEEN   Squamous Epithelial / LPF 0-5 (A) NONE SEEN  Wet prep, genital     Status: Abnormal   Collection Time: 07/06/16  3:05 PM  Result Value Ref Range   Yeast Wet Prep HPF POC NONE SEEN NONE SEEN   Trich, Wet Prep NONE SEEN NONE SEEN   Clue Cells Wet Prep HPF POC NONE SEEN NONE SEEN   WBC, Wet Prep HPF POC MODERATE (A) NONE SEEN   Sperm NONE SEEN    MAU Course  Procedures  HSV culture results will be reported  MDM MSE Cultures to lab Labs--Neg Wet Prep and UA Rx for Valtrex  Assessment and Plan  A:  Vulvar ulcerated lesions X 2      Rule out HSV initial infection P:  Rx for Valtrex       Instructed to avoid intercourse until lesions completely healed.  Encouraged condom use.      Discussed she will need to f/u with her MD to discuss culture result and consider suppressive            treatment if Type 2 HSV.      Suggested to not use false eyelashes until her eye pain resolves.  Suggested OTC Zyrtec or Claritin for allergies.    Dennison Mascot Andru Genter 07/06/2016, 2:56 PM

## 2016-07-07 LAB — HIV ANTIBODY (ROUTINE TESTING W REFLEX): HIV Screen 4th Generation wRfx: NONREACTIVE

## 2016-07-07 MED ORDER — DIPHENHYDRAMINE HCL 50 MG/ML IJ SOLN
12.5000 mg | Freq: Once | INTRAMUSCULAR | Status: AC
  Administered 2016-07-07: 12.5 mg via INTRAVENOUS
  Filled 2016-07-07: qty 1

## 2016-07-07 MED ORDER — NEOMYCIN-POLYMYXIN-HC 3.5-10000-1 OT SUSP: 4.0000 [drp] | Freq: Three times a day (TID) | OTIC | 0 refills | Status: AC

## 2016-07-07 MED ORDER — KETOROLAC TROMETHAMINE 15 MG/ML IJ SOLN
15.0000 mg | Freq: Once | INTRAMUSCULAR | Status: AC
  Administered 2016-07-07: 15 mg via INTRAVENOUS
  Filled 2016-07-07: qty 1

## 2016-07-07 MED ORDER — DEXAMETHASONE SODIUM PHOSPHATE 10 MG/ML IJ SOLN
10.0000 mg | Freq: Once | INTRAMUSCULAR | Status: AC
  Administered 2016-07-07: 10 mg via INTRAVENOUS
  Filled 2016-07-07: qty 1

## 2016-07-07 MED ORDER — METOCLOPRAMIDE HCL 5 MG/ML IJ SOLN
10.0000 mg | Freq: Once | INTRAMUSCULAR | Status: AC
  Administered 2016-07-07: 10 mg via INTRAVENOUS
  Filled 2016-07-07: qty 2

## 2016-07-07 NOTE — ED Notes (Signed)
The pt reports that her headache is better  She was sound asleep  Her boyfriend is sleeping on the stretcher with her.

## 2016-07-07 NOTE — ED Provider Notes (Signed)
[facility]-EMERGENCY DEPT Provider Note   CSN: [id number] Arrival date & time: 07/06/16  2247     History   Chief Complaint Chief Complaint  Patient presents with  . Otalgia  . Headache    HPI Jaclyn Owens is a 21 y.o. female.  The patient is here with complaint of right ear pain and muffled hearing for the past 4-5 days. She reports cleaning her ears with a Q-tip and removing a lot of wax but this did not resolve the symptoms. No fever, congestion, sore throat, ear drainage or bleeding.   She also complains of a headache that is bilateral, temporal and frontal. It is a throbbing headache and is associated with photophobia and nausea. She has a remote history of migraine. She tried taking ibuprofen without relief. No fever, hematemesis, weakness, sensory disturbances or speech difficulty.   The history is provided by the patient. No language interpreter was used.  Otalgia  Associated symptoms include headaches. Pertinent negatives include no sore throat, no abdominal pain and no cough.  Headache   Associated symptoms include nausea. Pertinent negatives include no fever and no shortness of breath.    Past Medical History:  Diagnosis Date  . Asthma     Patient Active Problem List   Diagnosis Date Noted  . Normal labor and delivery 08/06/2015  . Abdominal pain affecting pregnancy 06/02/2015  . Pregnancy 04/14/2015  . High risk pregnancy with low PAPPA (pregnancy-associated plasma protein A) 02/17/2015  . First trimester screening 02/10/2015    Past Surgical History:  Procedure Laterality Date  . ARM WOUND REPAIR / CLOSURE      OB History    Gravida Para Term Preterm AB Living   1 1 1  0 0 1   SAB TAB Ectopic Multiple Live Births   0 0 0 0 0       Home Medications    Prior to Admission medications   Medication Sig Start Date End Date Taking? Authorizing Provider  acetaminophen (TYLENOL) 325 MG tablet Take 2 tablets (650 mg total) by mouth every 6 (six) hours as  needed. 04/11/16   Derwood KaplanNanavati, Ankit, MD  dicyclomine (BENTYL) 20 MG tablet Take 1 tablet (20 mg total) by mouth 2 (two) times daily. Patient not taking: Reported on 04/10/2016 01/13/16   Everlene Farrieransie, William, PA-C  ibuprofen (ADVIL,MOTRIN) 600 MG tablet Take 1 tablet (600 mg total) by mouth every 6 (six) hours. Patient not taking: Reported on 04/10/2016 08/07/15   Ward, Elenora Fenderhelsea C, MD  omeprazole (PRILOSEC) 20 MG capsule Take 1 capsule (20 mg total) by mouth daily. 04/11/16   Derwood KaplanNanavati, Ankit, MD  ondansetron (ZOFRAN ODT) 4 MG disintegrating tablet Take 1 tablet (4 mg total) by mouth every 8 (eight) hours as needed for nausea or vomiting. Patient not taking: Reported on 04/10/2016 01/13/16   Everlene Farrieransie, William, PA-C  valACYclovir (VALTREX) 1000 MG tablet Take 1 tablet (1,000 mg total) by mouth 2 (two) times daily. 07/06/16 07/16/16  KeyVerita Schneiders, Evelyn M, NP    Family History No family history on file.  Social History Social History  Substance Use Topics  . Smoking status: Former Smoker    Types: Cigarettes  . Smokeless tobacco: Never Used  . Alcohol use No     Allergies   Bee venom   Review of Systems Review of Systems  Constitutional: Negative for chills and fever.  HENT: Positive for ear pain. Negative for congestion and sore throat.   Eyes: Positive for photophobia.  Respiratory: Negative.  Negative for  cough and shortness of breath.   Cardiovascular: Negative.  Negative for chest pain.  Gastrointestinal: Positive for nausea. Negative for abdominal pain.  Musculoskeletal: Negative.   Skin: Negative.   Neurological: Positive for headaches. Negative for speech difficulty, weakness and numbness.     Physical Exam Updated Vital Signs BP 111/72   Pulse 97   Temp 98.6 F (37 C) (Oral)   Resp 18   Ht 5\' 6"  (1.676 m)   Wt 59.4 kg (131 lb)   LMP 07/02/2016   SpO2 100%   BMI 21.14 kg/m   Physical Exam  Constitutional: She is oriented to person, place, and time. She appears well-developed and  well-nourished.  HENT:  Head: Normocephalic.  Right ear cerumen impaction. No frontal sinus tenderness.   Neck: Normal range of motion. Neck supple.  Cardiovascular: Normal rate and regular rhythm.   No murmur heard. Pulmonary/Chest: Effort normal and breath sounds normal.  Abdominal: Soft. Bowel sounds are normal. There is no tenderness. There is no rebound and no guarding.  Musculoskeletal: Normal range of motion.  Neurological: She is alert and oriented to person, place, and time.  CN's 3-12 grossly intact. Speech is clear and focused. No facial asymmetry. No lateralizing weakness. Reflexes are equal. No deficits of coordination. Ambulatory without imbalance.    Skin: Skin is warm and dry. No rash noted.  Psychiatric: She has a normal mood and affect.     ED Treatments / Results  Labs (all labs ordered are listed, but only abnormal results are displayed) Labs Reviewed - No data to display  EKG  EKG Interpretation None       Radiology No results found.  Procedures Procedures (including critical care time)  Medications Ordered in ED Medications  metoCLOPramide (REGLAN) injection 10 mg (not administered)  diphenhydrAMINE (BENADRYL) injection 12.5 mg (not administered)  ketorolac (TORADOL) 15 MG/ML injection 15 mg (not administered)     Initial Impression / Assessment and Plan / ED Course  I have reviewed the triage vital signs and the nursing notes.  Pertinent labs & imaging results that were available during my care of the patient were reviewed by me and considered in my medical decision making (see chart for details).     Patient presents with headache typical of migraine headache without neurologic deficits. Better with headache cocktail. She has been sleeping on multiple re-evaluations. No neuro change over the course of observation.  Also complains of right ear pain. No fever, URI symptoms. After cerumen removed by curette, she has findings of external  otitis. Will give cortisporin otic.   She is stable for discharge home.   Final Clinical Impressions(s) / ED Diagnoses   Final diagnoses:  None   1. Otitis externa 2. Migraine  New Prescriptions New Prescriptions   No medications on file     Elpidio Anis, Cordelia Poche 07/07/16 [id number]    Glynn Octave, MD 07/07/16 [phone]

## 2016-07-08 LAB — HSV CULTURE AND TYPING

## 2016-07-09 LAB — GC/CHLAMYDIA PROBE AMP (~~LOC~~) NOT AT ARMC
Chlamydia: NEGATIVE
Neisseria Gonorrhea: NEGATIVE

## 2016-09-23 ENCOUNTER — Encounter (HOSPITAL_COMMUNITY): Payer: Self-pay

## 2016-09-23 ENCOUNTER — Inpatient Hospital Stay (HOSPITAL_COMMUNITY)
Admission: AD | Admit: 2016-09-23 | Discharge: 2016-09-23 | Disposition: A | Discharge status: Home / Self Care | Payer: Medicaid Other | Source: Ambulatory Visit | Attending: Obstetrics and Gynecology | Admitting: Obstetrics and Gynecology

## 2016-09-23 DIAGNOSIS — Z87891 Personal history of nicotine dependence: Secondary | ICD-10-CM | POA: Insufficient documentation

## 2016-09-23 DIAGNOSIS — Z91030 Bee allergy status: Secondary | ICD-10-CM | POA: Diagnosis not present

## 2016-09-23 DIAGNOSIS — N898 Other specified noninflammatory disorders of vagina: Secondary | ICD-10-CM | POA: Diagnosis not present

## 2016-09-23 DIAGNOSIS — J45909 Unspecified asthma, uncomplicated: Secondary | ICD-10-CM | POA: Diagnosis not present

## 2016-09-23 DIAGNOSIS — Z7251 High risk heterosexual behavior: Secondary | ICD-10-CM | POA: Diagnosis not present

## 2016-09-23 LAB — HCG, QUANTITATIVE, PREGNANCY: hCG, Beta Chain, Quant, S: <1 m[IU]/mL (ref <5)

## 2016-09-23 NOTE — MAU Note (Signed)
Urine sent to lab 

## 2016-09-23 NOTE — MAU Provider Note (Signed)
History   G1P1001 in states had unprotected sex and thinks she might be pregnant. Has not missed period. Declines STD testing. Denies complaints to me in presence of the nurse.  CSN: [id number]  Arrival date & time 09/23/16  1420   First Provider Initiated Contact with Patient 09/23/16 1450      Chief Complaint  Patient presents with  . Possible Pregnancy  . Vaginal Discharge    HPI  Past Medical History:  Diagnosis Date  . Asthma     Past Surgical History:  Procedure Laterality Date  . ARM WOUND REPAIR / CLOSURE      History reviewed. No pertinent family history.  Social History  Substance Use Topics  . Smoking status: Former Smoker    Types: Cigarettes  . Smokeless tobacco: Never Used  . Alcohol use No    OB History    Gravida Para Term Preterm AB Living   1 1 1  0 0 1   SAB TAB Ectopic Multiple Live Births   0 0 0 0 0      Review of Systems  Constitutional: Negative.   HENT: Negative.   Eyes: Negative.   Respiratory: Negative.   Cardiovascular: Negative.   Gastrointestinal: Negative.   Endocrine: Negative.   Genitourinary: Negative.   Musculoskeletal: Negative.   Skin: Negative.   Allergic/Immunologic: Negative.   Neurological: Negative.   Hematological: Negative.   Psychiatric/Behavioral: Negative.     Allergies  Bee venom  Home Medications    BP 112/85 (BP Location: Right Arm)   Pulse 100   Temp 98.3 F (36.8 C) (Oral)   Resp 16   Wt 127 lb 0.6 oz (57.6 kg)   SpO2 100%   BMI 20.50 kg/m   Physical Exam  Constitutional: She is oriented to person, place, and time. She appears well-developed and well-nourished.  Eyes: Pupils are equal, round, and reactive to light.  Neck: Normal range of motion.  Cardiovascular: Normal rate, regular rhythm, normal heart sounds and intact distal pulses.   Pulmonary/Chest: Effort normal and breath sounds normal.  Abdominal: Soft. Bowel sounds are normal.  Musculoskeletal: Normal range of motion.   Neurological: She is alert and oriented to person, place, and time. She has normal reflexes.  Skin: Skin is warm and dry.  Psychiatric: She has a normal mood and affect. Her behavior is normal. Judgment and thought content normal.    MAU Course  Procedures (including critical care time)  Labs Reviewed  HCG, QUANTITATIVE, PREGNANCY   No results found.   No diagnosis found.    MDM  Will get quant Hcg. VSS Quant neg will d/c home

## 2016-09-23 NOTE — MAU Note (Signed)
Arrived via ems  Neg HPT; patient refusing POC urine Pregnancy test--states wants a blood test  +nausea--no vomiting +headaches--intermittent +vaginal discharge--watery--no color or odor patient reports (having now) +abdominal cramping--intermittent  No symptoms now; happened on Thursday  LMP 7/3 & 7/30--states had 2 periods in july

## 2016-09-23 NOTE — Discharge Instructions (Signed)

## 2017-01-05 ENCOUNTER — Other Ambulatory Visit: Payer: Self-pay

## 2017-01-05 ENCOUNTER — Emergency Department (HOSPITAL_COMMUNITY)
Admission: EM | Admit: 2017-01-05 | Discharge: 2017-01-05 | Disposition: A | Discharge status: Home / Self Care | Payer: Medicaid Other | Attending: Emergency Medicine | Admitting: Emergency Medicine

## 2017-01-05 ENCOUNTER — Encounter (HOSPITAL_COMMUNITY): Payer: Self-pay

## 2017-01-05 DIAGNOSIS — B009 Herpesviral infection, unspecified: Secondary | ICD-10-CM

## 2017-01-05 DIAGNOSIS — Z87891 Personal history of nicotine dependence: Secondary | ICD-10-CM | POA: Insufficient documentation

## 2017-01-05 DIAGNOSIS — J45909 Unspecified asthma, uncomplicated: Secondary | ICD-10-CM | POA: Diagnosis not present

## 2017-01-05 DIAGNOSIS — Z113 Encounter for screening for infections with a predominantly sexual mode of transmission: Secondary | ICD-10-CM | POA: Diagnosis present

## 2017-01-05 MED ORDER — VALACYCLOVIR HCL 1 G PO TABS: 1000.0000 mg | ORAL_TABLET | Freq: Two times a day (BID) | ORAL | 3 refills | Status: DC | PRN

## 2017-01-05 NOTE — ED Notes (Signed)
Patient Alert and oriented X4. Stable and ambulatory. Patient verbalized understanding of the discharge instructions.  Patient belongings were taken by the patient.  

## 2017-01-05 NOTE — ED Provider Notes (Signed)
[facility]   CSN: [id number] Arrival date & time: 01/05/17  1756     History   Chief Complaint Chief Complaint  Patient presents with  . SEXUALLY TRANSMITTED DISEASE    HPI Jaclyn Owens is a 21 y.o. female.  21 year old female presents with rash to her genital area concerning for herpes.  Was diagnosed with herpes back in June of this year.  Denies any new vaginal bleeding or discharge.  Denies any dysuria or hematuria.  Began having burning as well as itching to the top left portion of the clitoris.  Denies any rectal lesions.  Symptoms persistent nothing makes them better or worse no treatment used prior to arrival.      Past Medical History:  Diagnosis Date  . Asthma     Patient Active Problem List   Diagnosis Date Noted  . Normal labor and delivery 08/06/2015  . Abdominal pain affecting pregnancy 06/02/2015  . Pregnancy 04/14/2015  . High risk pregnancy with low PAPPA (pregnancy-associated plasma protein A) 02/17/2015  . First trimester screening 02/10/2015    Past Surgical History:  Procedure Laterality Date  . ARM WOUND REPAIR / CLOSURE      OB History    Gravida Para Term Preterm AB Living   1 1 1  0 0 1   SAB TAB Ectopic Multiple Live Births   0 0 0 0 0       Home Medications    Prior to Admission medications   Medication Sig Start Date End Date Taking? Authorizing Provider  valACYclovir (VALTREX) 1000 MG tablet Take 1,000 mg by mouth 2 (two) times daily as needed (for outbreak).    [provider]    Family History History reviewed. No pertinent family history.  Social History Social History   Tobacco Use  . Smoking status: Former Smoker    Types: Cigarettes  . Smokeless tobacco: Never Used  Substance Use Topics  . Alcohol use: No  . 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    Physical Exam Updated Vital Signs BP 128/85 (BP Location: Right Arm)   Pulse 99   Temp 97.6 F (36.4 C) (Oral)   Resp 18   Ht 1.676 m (5\' 6" )   Wt 60.3 kg (133 lb)   LMP 12/20/2016   SpO2 100%   BMI 21.47 kg/m   Physical Exam  Constitutional: She is oriented to person, place, and time. She appears well-developed and well-nourished.  Non-toxic appearance. No distress.  HENT:  Head: Normocephalic and atraumatic.  Eyes: Conjunctivae, EOM and lids are normal. Pupils are equal, round, and reactive to light.  Neck: Normal range of motion. Neck supple. No tracheal deviation present. No thyroid mass present.  Cardiovascular: Normal rate, regular rhythm and normal heart sounds. Exam reveals no gallop.  No murmur heard. Pulmonary/Chest: Effort normal and breath sounds normal. No stridor. No respiratory distress. She has no decreased breath sounds. She has no wheezes. She has no rhonchi. She has no rales.  Abdominal: Soft. Normal appearance and bowel sounds are normal. She exhibits no distension. There is no tenderness. There is no rebound and no CVA tenderness.  Genitourinary:     Musculoskeletal: Normal range of motion. She exhibits no edema or tenderness.  Neurological: She is alert and oriented to person, place,  and time. She has normal strength. No cranial nerve deficit or sensory deficit. GCS eye subscore is 4. GCS verbal subscore is 5. GCS motor subscore is 6.  Skin: Skin is warm and dry. No abrasion and no rash noted.  Psychiatric: She has a normal mood and affect. Her speech is normal and behavior is normal.  Nursing note and vitals reviewed.    ED Treatments / Results  Labs (all labs ordered are listed, but only abnormal results are displayed) Labs Reviewed - No data to display  EKG  EKG Interpretation None       Radiology No results found.  Procedures Procedures (including critical care time)  Medications Ordered in  ED Medications - No data to display   Initial Impression / Assessment and Plan / ED Course  I have reviewed the triage vital signs and the nursing notes.  Pertinent labs & imaging results that were available during my care of the patient were reviewed by me and considered in my medical decision making (see chart for details).     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

## 2017-01-05 NOTE — ED Triage Notes (Addendum)
Pt states that she has genital herpes since June 2018 and is currently having an "outbreak." Pt states that it started yesterday and c/o vaginal itching and painful sore; no drainage. Pt states that she is also having oral lesions in mouth that "are not going away with medication."

## 2017-01-05 NOTE — ED Notes (Signed)
Patient is A&Ox4 at this time.  Patient in no signs of distress.  Please see providers note for complete history and physical exam.  

## 2017-02-24 ENCOUNTER — Emergency Department (HOSPITAL_COMMUNITY)
Admission: EM | Admit: 2017-02-24 | Discharge: 2017-02-25 | Disposition: A | Discharge status: Home / Self Care | Payer: Medicaid Other | Attending: Emergency Medicine | Admitting: Emergency Medicine

## 2017-02-24 ENCOUNTER — Encounter (HOSPITAL_COMMUNITY): Payer: Self-pay

## 2017-02-24 ENCOUNTER — Other Ambulatory Visit: Payer: Self-pay

## 2017-02-24 DIAGNOSIS — F4321 Adjustment disorder with depressed mood: Secondary | ICD-10-CM | POA: Insufficient documentation

## 2017-02-24 DIAGNOSIS — J45909 Unspecified asthma, uncomplicated: Secondary | ICD-10-CM | POA: Insufficient documentation

## 2017-02-24 DIAGNOSIS — T50902A Poisoning by unspecified drugs, medicaments and biological substances, intentional self-harm, initial encounter: Secondary | ICD-10-CM | POA: Insufficient documentation

## 2017-02-24 DIAGNOSIS — B009 Herpesviral infection, unspecified: Secondary | ICD-10-CM | POA: Diagnosis not present

## 2017-02-24 DIAGNOSIS — Z87891 Personal history of nicotine dependence: Secondary | ICD-10-CM | POA: Insufficient documentation

## 2017-02-24 DIAGNOSIS — T391X4A Poisoning by 4-Aminophenol derivatives, undetermined, initial encounter: Secondary | ICD-10-CM | POA: Diagnosis not present

## 2017-02-24 LAB — I-STAT BETA HCG BLOOD, ED (MC, WL, AP ONLY): I-stat hCG, quantitative: <5 m[IU]/mL (ref <5)

## 2017-02-24 LAB — RAPID URINE DRUG SCREEN, HOSP PERFORMED
AMPHETAMINES: NOT DETECTED
Barbiturates: NOT DETECTED
Benzodiazepines: NOT DETECTED
Cocaine: NOT DETECTED
OPIATES: NOT DETECTED
TETRAHYDROCANNABINOL: NOT DETECTED

## 2017-02-24 LAB — CBC
HEMATOCRIT: 36.6 % (ref 36.0–46.0)
HEMOGLOBIN: 12.5 g/dL (ref 12.0–15.0)
MCH: 29.8 pg (ref 26.0–34.0)
MCHC: 34.2 g/dL (ref 30.0–36.0)
MCV: 87.1 fL (ref 78.0–100.0)
Platelets: 270 10*3/uL (ref 150–400)
RBC: 4.2 MIL/uL (ref 3.87–5.11)
RDW: 12.6 % (ref 11.5–15.5)
WBC: 7.2 10*3/uL (ref 4.0–10.5)

## 2017-02-24 LAB — COMPREHENSIVE METABOLIC PANEL
ALBUMIN: 4 g/dL (ref 3.5–5.0)
ALT: 14 U/L (ref 14–54)
AST: 20 U/L (ref 15–41)
Alkaline Phosphatase: 18 U/L — ABNORMAL LOW (ref 38–126)
Anion gap: 4 — ABNORMAL LOW (ref 5–15)
BUN: 8 mg/dL (ref 6–20)
CHLORIDE: 109 mmol/L (ref 101–111)
CO2: 26 mmol/L (ref 22–32)
CREATININE: 0.65 mg/dL (ref 0.44–1.00)
Calcium: 8.7 mg/dL — ABNORMAL LOW (ref 8.9–10.3)
GFR calc Af Amer: >60 mL/min (ref >60)
GFR calc non Af Amer: >60 mL/min (ref >60)
GLUCOSE: 97 mg/dL (ref 65–99)
Potassium: 3.8 mmol/L (ref 3.5–5.1)
SODIUM: 139 mmol/L (ref 135–145)
Total Bilirubin: 0.5 mg/dL (ref 0.3–1.2)
Total Protein: 6.9 g/dL (ref 6.5–8.1)

## 2017-02-24 LAB — ACETAMINOPHEN LEVEL
Acetaminophen (Tylenol), Serum: 35 ug/mL — ABNORMAL HIGH (ref 10–30)
Acetaminophen (Tylenol), Serum: 63 ug/mL — ABNORMAL HIGH (ref 10–30)

## 2017-02-24 LAB — SALICYLATE LEVEL

## 2017-02-24 LAB — ETHANOL: Alcohol, Ethyl (B): <10 mg/dL (ref <10)

## 2017-02-24 LAB — CBG MONITORING, ED: Glucose-Capillary: 104 mg/dL — ABNORMAL HIGH (ref 65–99)

## 2017-02-24 MED ORDER — ONDANSETRON HCL 4 MG PO TABS: 4.0000 mg | ORAL_TABLET | Freq: Three times a day (TID) | ORAL | Status: DC | PRN

## 2017-02-24 MED ORDER — NICOTINE 21 MG/24HR TD PT24
21.0000 mg | MEDICATED_PATCH | Freq: Every day | TRANSDERMAL | Status: DC
  Administered 2017-02-24 – 2017-02-25 (×2): 21 mg via TRANSDERMAL
  Filled 2017-02-24 (×2): qty 1

## 2017-02-24 MED ORDER — IBUPROFEN 200 MG PO TABS: 600.0000 mg | ORAL_TABLET | Freq: Three times a day (TID) | ORAL | Status: DC | PRN

## 2017-02-24 MED ORDER — TRAZODONE HCL 50 MG PO TABS
50.0000 mg | ORAL_TABLET | Freq: Every evening | ORAL | Status: DC | PRN
  Administered 2017-02-24: 50 mg via ORAL
  Filled 2017-02-24: qty 1

## 2017-02-24 MED ORDER — ZOLPIDEM TARTRATE 5 MG PO TABS: 5.0000 mg | ORAL_TABLET | Freq: Every evening | ORAL | Status: DC | PRN

## 2017-02-24 NOTE — ED Notes (Signed)
Patient denies SI/HI.  States she wasn't "serious" when she took the tylenol. Feels overwhelmed with herpes diagnosis.  Support offered. Food/fluids given.
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
## 2017-02-24 NOTE — ED Triage Notes (Signed)
States about one hour ago took about 8-10 500 mg tylenol because she is depressed and now feels sleepy.

## 2017-02-24 NOTE — ED Notes (Signed)
PT cell phone with grandmother
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
## 2017-02-24 NOTE — ED Provider Notes (Signed)
[facility]-EMERGENCY DEPT Provider Note   CSN: [id number] Arrival date & time: 02/24/17  0250     History   Chief Complaint Chief Complaint  Patient presents with  . Drug Overdose    HPI Sindy GuadeloupeYaKwanda Swaggerty is a 22 y.o. female.  Patient presents to the emergency department with a chief complaint of depression.  She states that she took 8-10 500 mg Tylenol tablets because she has herpes.  The ingestion occurred at ~1:30 am.  She states that this causes her to be depressed.  She avoids answering questions regarding suicidality.  She denies any drug or alcohol use.  She denies any other symptoms.   The history is provided by the patient. No language interpreter was used.    Past Medical History:  Diagnosis Date  . Asthma     Patient Active Problem List   Diagnosis Date Noted  . Normal labor and delivery 08/06/2015  . Abdominal pain affecting pregnancy 06/02/2015  . Pregnancy 04/14/2015  . High risk pregnancy with low PAPPA (pregnancy-associated plasma protein A) 02/17/2015  . First trimester screening 02/10/2015    Past Surgical History:  Procedure Laterality Date  . ARM WOUND REPAIR / CLOSURE      OB History    Gravida Para Term Preterm AB Living   1 1 1  0 0 1   SAB TAB Ectopic Multiple Live Births   0 0 0 0 0       Home Medications    Prior to Admission medications   Medication Sig Start Date End Date Taking? Authorizing Provider  valACYclovir (VALTREX) 1000 MG tablet Take 1 tablet (1,000 mg total) by mouth 2 (two) times daily as needed (for outbreak). 01/05/17  Yes Lorre NickAllen, Anthony, MD    Family History History reviewed. No pertinent family history.  Social History Social History   Tobacco Use  . Smoking status: Former Smoker    Types: Cigarettes  . Smokeless tobacco: Never Used  Substance Use Topics  . Alcohol use: No  . 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    Physical Exam Updated Vital Signs BP 115/82   Pulse 60   Temp 98.1 F (36.7 C) (Oral)   Resp 19   Ht 5\' 6"  (1.676 m)   Wt 60.3 kg (133 lb)   SpO2 100%   BMI 21.47 kg/m   Physical Exam  Constitutional: She is oriented to person, place, and time. She appears well-developed and well-nourished.  HENT:  Head: Normocephalic and atraumatic.  Eyes: Conjunctivae and EOM are normal. Pupils are equal, round, and reactive to light.  Neck: Normal range of motion. Neck supple.  Cardiovascular: Normal rate and regular rhythm. Exam reveals no gallop and no friction rub.  No murmur heard. Pulmonary/Chest: Effort normal and breath sounds normal. No respiratory distress. She has no wheezes. She has no rales. She exhibits no tenderness.  Abdominal: Soft. Bowel sounds are normal. She exhibits no distension and no mass. There is no tenderness. There is no rebound and no guarding.  Musculoskeletal: Normal range of motion. She exhibits no edema or tenderness.  Neurological: She is alert and oriented to person, place, and time.  Skin: Skin is warm and dry.  Psychiatric: She has a normal mood and affect. Her behavior is normal. Judgment and thought content normal.  Nursing note  and vitals reviewed.    ED Treatments / Results  Labs (all labs ordered are listed, but only abnormal results are displayed) Labs Reviewed  COMPREHENSIVE METABOLIC PANEL  ETHANOL  SALICYLATE LEVEL  ACETAMINOPHEN LEVEL  CBC  RAPID URINE DRUG SCREEN, HOSP PERFORMED  CBG MONITORING, ED  I-STAT BETA HCG BLOOD, ED ([facility], [facility], [facility] ONLY)    EKG  EKG Interpretation None       Radiology No results found.  Procedures Procedures (including critical care time)  Medications Ordered in ED Medications - No data to display   Initial Impression / Assessment and Plan / ED Course  I have reviewed the triage vital signs and the nursing notes.  Pertinent labs &  imaging results that were available during my care of the patient were reviewed by me and considered in my medical decision making (see chart for details).     Patient reportedly took 8-10 500 mg Tylenol tablets because she is depressed.  She states that she is depressed because she has herpes.  She refuses to answer my question whether she is suicidal or not, or whether she took the Tylenol in order to kill herself.  She repeatedly states that she took it to because she has herpes.  Initial labs show acetaminophen level of 35.  We will recheck this at 6 am.  Otherwise, labs are unremarkable.   TTS evaluation after 2nd Tylenol level.  Patient signed out to oncoming team.  Plan:  Repeat Tylenol level.  If critical, administer NAC and admit, if not, consult TTS.  Final Clinical Impressions(s) / ED Diagnoses   Final diagnoses:  None    ED Discharge Orders    None       Roxy Horseman, PA-C 02/24/17 0453    Roxy Horseman, PA-C 02/24/17 1610    Gilda Crease, MD 02/24/17 [phone]

## 2017-02-24 NOTE — ED Notes (Signed)
Patient seen and interviewed.  Patient reports that she ingested 8-10 tablets of 500 mg Tylenol after she was diagnosed with Herpes from unprotected sex. However, she states that she did not want to kill herself but feels depressed. Lab reviewed: Acetaminophen level is trending up: from 35ug.ml on admission to 63 ug/ml early this morning.  Plan: 1. Repeat Acetaminophen level in am 2. Repeat Hepatic panel level in am 3.  Observation for 24 hrs and re-evaluate patient  in the morning/labs reports.  Thedore MinsMojeed Buell Parcel, MD

## 2017-02-24 NOTE — ED Provider Notes (Signed)
PROGRESS NOTE                                                                                                                 This is a sign-out from PA DuqueBrowning at shift change: Jaclyn Owens is a 22 y.o. female presenting with suicide attempt after taking 8-10 Tylenol at 1:30 AM, she states that she depressed because she has herpes.  Plan to follow-up second Tylenol level at 6 AM. Please refer to previous note for full HPI, ROS, PMH and PE.    Repeat level is 63, this is approximately at 5 hours, this is up from 35 initially.  Although it is below the treatment level because it is rising I have asked the nurse to call poison control and double check that we do not need a another recheck.  Per nurse we do not.  Patient is medically cleared for psychiatric evaluation.   Kaylyn Limisciotta, Digna Countess, PA-C 02/24/17 0708   This with TTS who states that Dr.Akintayo would like a Tylenol level drawn tomorrow (on the 28th)   Kaylyn Limisciotta, Goro Wenrick, PA-C 02/24/17 1240    Pollina, Canary Brimhristopher J, MD 02/25/17 [phone]

## 2017-02-24 NOTE — BH Assessment (Signed)
[facility]  Dr. Jannifer FranklinAkintayo recommends that pt be observed in the ED overnight and a repeat Tylenol level be completed tomorrow. If pt does not agree to stay, she will be IVC'd for treatment. Wynetta EmeryNicole Pisciotta, PA-C, notified of disposition.   Johny ShockSamantha M. Ladona Ridgelaylor, MS, NCC, LPCA Counselor

## 2017-02-24 NOTE — ED Notes (Signed)
SBAR Report received from previous nurse. Pt received calm and visible on unit. Pt denies current SI/ HI, A/V H, depression, anxiety, or pain at this time, and appears otherwise stable and free of distress. Pt reminded of camera surveillance, q 15 min rounds, and rules of the milieu. Will continue to assess. 

## 2017-02-25 DIAGNOSIS — T391X4A Poisoning by 4-Aminophenol derivatives, undetermined, initial encounter: Secondary | ICD-10-CM

## 2017-02-25 DIAGNOSIS — Z87891 Personal history of nicotine dependence: Secondary | ICD-10-CM

## 2017-02-25 DIAGNOSIS — R4587 Impulsiveness: Secondary | ICD-10-CM

## 2017-02-25 DIAGNOSIS — F4321 Adjustment disorder with depressed mood: Secondary | ICD-10-CM

## 2017-02-25 DIAGNOSIS — B009 Herpesviral infection, unspecified: Secondary | ICD-10-CM

## 2017-02-25 LAB — HEPATIC FUNCTION PANEL
ALT: 12 U/L — ABNORMAL LOW (ref 14–54)
AST: 15 U/L (ref 15–41)
Albumin: 3.8 g/dL (ref 3.5–5.0)
Alkaline Phosphatase: 16 U/L — ABNORMAL LOW (ref 38–126)
Bilirubin, Direct: 0.1 mg/dL (ref 0.1–0.5)
Indirect Bilirubin: 0.5 mg/dL (ref 0.3–0.9)
Total Bilirubin: 0.6 mg/dL (ref 0.3–1.2)
Total Protein: 6.3 g/dL — ABNORMAL LOW (ref 6.5–8.1)

## 2017-02-25 LAB — ACETAMINOPHEN LEVEL: Acetaminophen (Tylenol), Serum: <10 ug/mL — ABNORMAL LOW (ref 10–30)

## 2017-02-25 NOTE — ED Notes (Signed)
Lab called and informed of need for AM blood work.

## 2017-02-25 NOTE — Consult Note (Signed)
[facility] Face-to-Face Psychiatry Consult   Reason for Consult:   Referring Physician:  EDP Patient Identification: Jaclyn Owens MRN:  [id number] Principal Diagnosis: Adjustment disorder with depressed mood Diagnosis:   Patient Active Problem List   Diagnosis Date Noted  . Adjustment disorder with depressed mood [F43.21] 02/24/2017  . Normal labor and delivery [O80] 08/06/2015  . Abdominal pain affecting pregnancy [O26.899, R10.9] 06/02/2015  . Pregnancy [Z34.90] 04/14/2015  . High risk pregnancy with low PAPPA (pregnancy-associated plasma protein A) [O28.0, O09.899] 02/17/2015  . First trimester screening [Z36.9] 02/10/2015    Total Time spent with patient: 45 minutes  Subjective:   Jaclyn Owens is a 22 y.o. female patient admitted with depression.  HPI:  Pt was seen and chart reviewed with treatment team and Dr Mariea Clonts. Pt stated she has herpes and is having an outbreak and took 8 Tylenol tablets because she was upset about her herpes. Pt stated she felt at the time like she just wanted out. Pt's Tylenol level today is <10. Pt UDS and BAL are negative.  Pt denies suicidal/homicidal ideation, denies auditory/visual hallucinations and does not appear to be responding to internal stimuli. Pt is able to contract for safety. Pt stated she lives with her mother and her mother's boyfriend and feels safe at home. Pt is psychiatrically clear for discharge   Past Psychiatric History: As above  Risk to Self: None Risk to Others: None Prior Inpatient Therapy: Prior Inpatient Therapy: No Prior Outpatient Therapy: Prior Outpatient Therapy: Yes Prior Therapy Dates: years ago Does patient have an ACCT team?: No Does patient have Intensive In-House Services?  : No Does patient have [facility] services? : No Does patient have P4CC services?: No  Past Medical History:  Past Medical History:  Diagnosis Date  . Asthma     Past Surgical History:  Procedure Laterality Date  . ARM WOUND REPAIR /  CLOSURE     Family History: History reviewed. No pertinent family history. 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 Marital status: Single    Spouse name: None  . Number of children: None  . Years of education: None  . Highest education level: None  Social Needs  . Financial resource strain: None  . Food insecurity - worry: None  . Food insecurity - inability: None  . Transportation needs - medical: None  . Transportation needs - non-medical: None  Occupational History  . None  Tobacco Use  . Smoking status: Former Smoker    Types: Cigarettes  . Smokeless tobacco: Never Used  Substance and Sexual Activity  . Alcohol use: No  . 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 Sexual activity: Yes    Birth control/protection: None  Other Topics Concern  . None  Social History Narrative  . None   Additional Social History: N/A    Allergies:   Allergies  Allergen Reactions  . Bee Venom Anaphylaxis    Labs:  Results for orders placed or performed during the hospital encounter of 02/24/17 (from the past 48 hour(s))  Comprehensive metabolic panel     Status: Abnormal   Collection Time: 02/24/17  3:01 AM  Result Value Ref Range   Sodium 139 135 - 145 mmol/L  Potassium 3.8 3.5 - 5.1 mmol/L   Chloride 109 101 - 111 mmol/L   CO2 26 22 - 32 mmol/L   Glucose, Bld 97 65 - 99 mg/dL   BUN 8 6 - 20 mg/dL   Creatinine, Ser 0.65 0.44 - 1.00 mg/dL   Calcium 8.7 (L) 8.9 - 10.3 mg/dL   Total Protein 6.9 6.5 - 8.1 g/dL   Albumin 4.0 3.5 - 5.0 g/dL   AST 20 15 - 41 U/L   ALT 14 14 - 54 U/L   Alkaline Phosphatase 18 (L) 38 - 126 U/L   Total Bilirubin 0.5 0.3 - 1.2 mg/dL   GFR calc non Af Amer >60 >60 mL/min   GFR calc Af Amer >60 >60 mL/min    Comment:  (NOTE) The eGFR has been calculated using the CKD EPI equation. This calculation has not been validated in all clinical situations. eGFR's persistently <60 mL/min signify possible Chronic Kidney Disease.    Anion gap 4 (L) 5 - 15  Ethanol     Status: None   Collection Time: 02/24/17  3:01 AM  Result Value Ref Range   Alcohol, Ethyl (B) <10 <10 mg/dL    Comment:        LOWEST DETECTABLE LIMIT FOR SERUM ALCOHOL IS 10 mg/dL FOR MEDICAL PURPOSES ONLY   Salicylate level     Status: None   Collection Time: 02/24/17  3:01 AM  Result Value Ref Range   Salicylate Lvl <7.5 2.8 - 30.0 mg/dL  Acetaminophen level     Status: Abnormal   Collection Time: 02/24/17  3:01 AM  Result Value Ref Range   Acetaminophen (Tylenol), Serum 35 (H) 10 - 30 ug/mL    Comment:        THERAPEUTIC CONCENTRATIONS VARY SIGNIFICANTLY. A RANGE OF 10-30 ug/mL MAY BE AN EFFECTIVE CONCENTRATION FOR MANY PATIENTS. HOWEVER, SOME ARE BEST TREATED AT CONCENTRATIONS OUTSIDE THIS RANGE. ACETAMINOPHEN CONCENTRATIONS >150 ug/mL AT 4 HOURS AFTER INGESTION AND >50 ug/mL AT 12 HOURS AFTER INGESTION ARE OFTEN ASSOCIATED WITH TOXIC REACTIONS.   cbc     Status: None   Collection Time: 02/24/17  3:01 AM  Result Value Ref Range   WBC 7.2 4.0 - 10.5 K/uL   RBC 4.20 3.87 - 5.11 MIL/uL   Hemoglobin 12.5 12.0 - 15.0 g/dL   HCT 36.6 36.0 - 46.0 %   MCV 87.1 78.0 - 100.0 fL   MCH 29.8 26.0 - 34.0 pg   MCHC 34.2 30.0 - 36.0 g/dL   RDW 12.6 11.5 - 15.5 %   Platelets 270 150 - 400 K/uL  Rapid urine drug screen (hospital performed)     Status: None   Collection Time: 02/24/17  3:01 AM  Result Value Ref Range   Opiates NONE DETECTED NONE DETECTED   Cocaine NONE DETECTED NONE DETECTED   Benzodiazepines NONE DETECTED NONE DETECTED   Amphetamines NONE DETECTED NONE DETECTED   Tetrahydrocannabinol NONE DETECTED NONE DETECTED   Barbiturates NONE DETECTED NONE DETECTED    Comment: (NOTE) DRUG SCREEN FOR MEDICAL PURPOSES ONLY.   IF CONFIRMATION IS NEEDED FOR ANY PURPOSE, NOTIFY LAB WITHIN 5 DAYS. LOWEST DETECTABLE LIMITS FOR URINE DRUG SCREEN Drug Class                     Cutoff (ng/mL) Amphetamine and metabolites    1000 Barbiturate and metabolites    200 Benzodiazepine  035 Tricyclics and metabolites     300 Opiates and metabolites        300 Cocaine and metabolites        300 THC                            50   I-Stat beta hCG blood, ED     Status: None   Collection Time: 02/24/17  3:28 AM  Result Value Ref Range   I-stat hCG, quantitative <5.0 <5 mIU/mL   Comment 3            Comment:   GEST. AGE      CONC.  (mIU/mL)   <=1 WEEK        5 - 50     2 WEEKS       50 - 500     3 WEEKS       100 - 10,000     4 WEEKS     1,000 - 30,000        FEMALE AND NON-PREGNANT FEMALE:     LESS THAN 5 mIU/mL   CBG monitoring, ED     Status: Abnormal   Collection Time: 02/24/17  3:52 AM  Result Value Ref Range   Glucose-Capillary 104 (H) 65 - 99 mg/dL  Acetaminophen level     Status: Abnormal   Collection Time: 02/24/17  5:51 AM  Result Value Ref Range   Acetaminophen (Tylenol), Serum 63 (H) 10 - 30 ug/mL    Comment:        THERAPEUTIC CONCENTRATIONS VARY SIGNIFICANTLY. A RANGE OF 10-30 ug/mL MAY BE AN EFFECTIVE CONCENTRATION FOR MANY PATIENTS. HOWEVER, SOME ARE BEST TREATED AT CONCENTRATIONS OUTSIDE THIS RANGE. ACETAMINOPHEN CONCENTRATIONS >150 ug/mL AT 4 HOURS AFTER INGESTION AND >50 ug/mL AT 12 HOURS AFTER INGESTION ARE OFTEN ASSOCIATED WITH TOXIC REACTIONS.   I-Stat beta hCG blood, ED     Status: None   Collection Time: 02/24/17  7:22 AM  Result Value Ref Range   I-stat hCG, quantitative <5.0 <5 mIU/mL   Comment 3            Comment:   GEST. AGE      CONC.  (mIU/mL)   <=1 WEEK        5 - 50     2 WEEKS       50 - 500     3 WEEKS       100 - 10,000     4 WEEKS     1,000 - 30,000        FEMALE AND NON-PREGNANT FEMALE:     LESS THAN 5 mIU/mL   Hepatic function panel     Status:  Abnormal   Collection Time: 02/25/17  7:00 AM  Result Value Ref Range   Total Protein 6.3 (L) 6.5 - 8.1 g/dL   Albumin 3.8 3.5 - 5.0 g/dL   AST 15 15 - 41 U/L   ALT 12 (L) 14 - 54 U/L   Alkaline Phosphatase 16 (L) 38 - 126 U/L   Total Bilirubin 0.6 0.3 - 1.2 mg/dL   Bilirubin, Direct 0.1 0.1 - 0.5 mg/dL   Indirect Bilirubin 0.5 0.3 - 0.9 mg/dL  Acetaminophen level     Status: Abnormal   Collection Time: 02/25/17  7:00 AM  Result Value Ref Range   Acetaminophen (Tylenol), Serum <10 (L) 10 - 30 ug/mL    Comment:  THERAPEUTIC CONCENTRATIONS VARY SIGNIFICANTLY. A RANGE OF 10-30 ug/mL MAY BE AN EFFECTIVE CONCENTRATION FOR MANY PATIENTS. HOWEVER, SOME ARE BEST TREATED AT CONCENTRATIONS OUTSIDE THIS RANGE. ACETAMINOPHEN CONCENTRATIONS >150 ug/mL AT 4 HOURS AFTER INGESTION AND >50 ug/mL AT 12 HOURS AFTER INGESTION ARE OFTEN ASSOCIATED WITH TOXIC REACTIONS.     Current Facility-Administered Medications  Medication Dose Route Frequency Provider Last Rate Last Dose  . ibuprofen (ADVIL,MOTRIN) tablet 600 mg  600 mg Oral Q8H PRN Pisciotta, Nicole, PA-C      . nicotine (NICODERM CQ - dosed in mg/24 hours) patch 21 mg  21 mg Transdermal Daily Pisciotta, Nicole, PA-C   21 mg at 02/25/17 1059  . ondansetron (ZOFRAN) tablet 4 mg  4 mg Oral Q8H PRN Pisciotta, Nicole, PA-C      . traZODone (DESYREL) tablet 50 mg  50 mg Oral QHS PRN Corena Pilgrim, MD   50 mg at 02/24/17 2115   Current Outpatient Medications  Medication Sig Dispense Refill  . valACYclovir (VALTREX) 1000 MG tablet Take 1 tablet (1,000 mg total) by mouth 2 (two) times daily as needed (for outbreak). 10 tablet 3    Musculoskeletal: Strength & Muscle Tone: within normal limits Gait & Station: normal Patient leans: N/A  Psychiatric Specialty Exam: Physical Exam  Nursing note and vitals reviewed. Constitutional: She is oriented to person, place, and time. She appears well-developed and well-nourished.  HENT:  Head:  Normocephalic.  Neck: Normal range of motion.  Respiratory: Effort normal.  Musculoskeletal: Normal range of motion.  Neurological: She is alert and oriented to person, place, and time.  Psychiatric: She has a normal mood and affect. Her speech is normal and behavior is normal. Thought content normal. Cognition and memory are normal. She expresses impulsivity.    ROS  Blood pressure (!) 92/52, pulse 84, temperature 98.4 F (36.9 C), temperature source Oral, resp. rate 18, height '5\' 6"'  (1.676 m), weight 60.3 kg (133 lb), SpO2 100 %, unknown if currently breastfeeding.Body mass index is 21.47 kg/m.  General Appearance: Casual  Eye Contact:  Good  Speech:  Clear and Coherent and Normal Rate  Volume:  Normal  Mood:  Depressed  Affect:  Congruent and Depressed  Thought Process:  Coherent, Goal Directed and Linear  Orientation:  Full (Time, Place, and Person)  Thought Content:  Logical  Suicidal Thoughts:  No  Homicidal Thoughts:  No  Memory:  Immediate;   Good Recent;   Good Remote;   Fair  Judgement:  Fair  Insight:  Fair  Psychomotor Activity:  Normal  Concentration:  Concentration: Good and Attention Span: Good  Recall:  Good  Fund of Knowledge:  Good  Language:  Good  Akathisia:  No  Handed:  Right  AIMS (if indicated):    N/A  Assets:  Communication Skills Desire for Improvement Financial Resources/Insurance Housing Physical Health Social Support  ADL's:  Intact  Cognition:  WNL  Sleep:    N/A     Treatment Plan Summary: Plan Adjustment disorder with depressed mood  Discharge Home Follow up with [facility] for medication management and therapy Avoid the use of alcohol and illicit drugs Take all medications as prescribed   Disposition: No evidence of imminent risk to self or others at present.   Patient does not meet criteria for psychiatric inpatient admission. Supportive therapy provided about ongoing stressors. Discussed crisis plan, support from social  network, calling 911, coming to the Emergency Department, and calling Suicide Hotline.  Ethelene Hal, NP 02/25/2017 11:28 AM  Patient seen face-to-face for psychiatric evaluation, chart reviewed and case discussed with the physician extender and developed treatment plan. Reviewed the information documented and agree with the treatment plan.  Buford Dresser, DO 02/25/17 10:56 PM

## 2017-02-25 NOTE — ED Notes (Signed)
Pt discharged home. Discharged instructions read to pt who verbalized understanding. All belongings returned to pt who signed for same. Denies SI/HI, is not delusional and not responding to internal stimuli. Escorted pt to the ED exit.    

## 2017-02-25 NOTE — Discharge Instructions (Signed)
Follow up at:  [facility] [address] [place], [place] [address] [phone] Hour of operations: Monday-Friday, 8:30-5 p.m.

## 2017-02-25 NOTE — [facility] Suicide Risk Assessment (Signed)
Suicide Risk Assessment  Discharge Assessment   [facility]   Principal Problem: Adjustment disorder with depressed mood Discharge Diagnoses:  Patient Active Problem List   Diagnosis Date Noted  . Adjustment disorder with depressed mood [F43.21] 02/24/2017  . Normal labor and delivery [O80] 08/06/2015  . Abdominal pain affecting pregnancy [O26.899, R10.9] 06/02/2015  . Pregnancy [Z34.90] 04/14/2015  . High risk pregnancy with low PAPPA (pregnancy-associated plasma protein A) [O28.0, O09.899] 02/17/2015  . First trimester screening [Z36.9] 02/10/2015    Total Time spent with patient: 30 minutes  Musculoskeletal: Strength & Muscle Tone: within normal limits Gait & Station: normal Patient leans: N/A  Psychiatric Specialty Exam: Physical Exam  Constitutional: She is oriented to person, place, and time. She appears well-developed and well-nourished.  HENT:  Head: Normocephalic.  Respiratory: Effort normal.  Musculoskeletal: Normal range of motion.  Neurological: She is alert and oriented to person, place, and time.  Psychiatric: She has a normal mood and affect. Her speech is normal and behavior is normal. Thought content normal. Cognition and memory are normal. She expresses impulsivity.   ROS Blood pressure (!) 92/52, pulse 84, temperature 98.4 F (36.9 C), temperature source Oral, resp. rate 18, height 5\' 6"  (1.676 m), weight 60.3 kg (133 lb), SpO2 100 %, unknown if currently breastfeeding.Body mass index is 21.47 kg/m. General Appearance: Casual Eye Contact:  Good Speech:  Clear and Coherent and Normal Rate Volume:  Normal Mood:  Depressed Affect:  Congruent and Depressed Thought Process:  Coherent, Goal Directed and Linear Orientation:  Full (Time, Place, and Person) Thought Content:  Logical Suicidal Thoughts:  No Homicidal Thoughts:  No Memory:  Immediate;   Good Recent;   Good Remote;   Fair Judgement:  Fair Insight:  Fair Psychomotor  Activity:  Normal Concentration:  Concentration: Good and Attention Span: Good Recall:  Good Fund of Knowledge:  Good Language:  Good Akathisia:  No Handed:  Right AIMS (if indicated):    Assets:  Communication Skills Desire for Improvement Financial Resources/Insurance Housing Physical Health Social Support ADL's:  Intact Cognition:  WNL   Mental Status Per Nursing Assessment::   On Admission:   Unintentional overdose  Demographic Factors:  Adolescent or young adult  Loss Factors: Financial problems/change in socioeconomic status  Historical Factors: Impulsivity  Risk Reduction Factors:   Sense of responsibility to family and Living with another person, especially a relative  Continued Clinical Symptoms:  Depression:   Impulsivity  Cognitive Features That Contribute To Risk:  Closed-mindedness    Suicide Risk:  Minimal: No identifiable suicidal ideation.  Patients presenting with no risk factors but with morbid ruminations; may be classified as minimal risk based on the severity of the depressive symptoms    Plan Of Care/Follow-up recommendations:  Activity:  as tolerated Diet:  Heart Healthy  Laveda AbbeLaurie Britton Pilar Westergaard, NP 02/25/2017, 11:29 AM

## 2017-02-26 ENCOUNTER — Other Ambulatory Visit: Payer: Self-pay

## 2017-02-26 ENCOUNTER — Encounter (HOSPITAL_COMMUNITY): Payer: Self-pay | Admitting: Emergency Medicine

## 2017-02-26 ENCOUNTER — Emergency Department (HOSPITAL_COMMUNITY)
Admission: EM | Admit: 2017-02-26 | Discharge: 2017-02-26 | Disposition: A | Discharge status: Home / Self Care | Payer: Medicaid Other | Attending: Emergency Medicine | Admitting: Emergency Medicine

## 2017-02-26 ENCOUNTER — Emergency Department (HOSPITAL_COMMUNITY): Payer: Medicaid Other

## 2017-02-26 DIAGNOSIS — Z87891 Personal history of nicotine dependence: Secondary | ICD-10-CM | POA: Insufficient documentation

## 2017-02-26 DIAGNOSIS — J45909 Unspecified asthma, uncomplicated: Secondary | ICD-10-CM | POA: Diagnosis not present

## 2017-02-26 DIAGNOSIS — Z79899 Other long term (current) drug therapy: Secondary | ICD-10-CM | POA: Insufficient documentation

## 2017-02-26 DIAGNOSIS — R0789 Other chest pain: Secondary | ICD-10-CM | POA: Diagnosis not present

## 2017-02-26 DIAGNOSIS — R079 Chest pain, unspecified: Secondary | ICD-10-CM | POA: Diagnosis present

## 2017-02-26 LAB — I-STAT BETA HCG BLOOD, ED (MC, WL, AP ONLY): I-stat hCG, quantitative: <5 m[IU]/mL (ref <5)

## 2017-02-26 LAB — BASIC METABOLIC PANEL
ANION GAP: 8 (ref 5–15)
BUN: 13 mg/dL (ref 6–20)
CALCIUM: 9.1 mg/dL (ref 8.9–10.3)
CO2: 26 mmol/L (ref 22–32)
CREATININE: 0.83 mg/dL (ref 0.44–1.00)
Chloride: 106 mmol/L (ref 101–111)
GFR calc Af Amer: >60 mL/min (ref >60)
GLUCOSE: 98 mg/dL (ref 65–99)
Potassium: 4.2 mmol/L (ref 3.5–5.1)
Sodium: 140 mmol/L (ref 135–145)

## 2017-02-26 LAB — I-STAT TROPONIN, ED: TROPONIN I, POC: 0 ng/mL (ref 0.00–0.08)

## 2017-02-26 LAB — CBC
HCT: 35.5 % — ABNORMAL LOW (ref 36.0–46.0)
HEMOGLOBIN: 11.8 g/dL — AB (ref 12.0–15.0)
MCH: 29 pg (ref 26.0–34.0)
MCHC: 33.2 g/dL (ref 30.0–36.0)
MCV: 87.2 fL (ref 78.0–100.0)
PLATELETS: 269 10*3/uL (ref 150–400)
RBC: 4.07 MIL/uL (ref 3.87–5.11)
RDW: 12.7 % (ref 11.5–15.5)
WBC: 8.9 10*3/uL (ref 4.0–10.5)

## 2017-02-26 MED ORDER — GI COCKTAIL ~~LOC~~
30.0000 mL | Freq: Once | ORAL | Status: AC
  Administered 2017-02-26: 30 mL via ORAL
  Filled 2017-02-26: qty 30

## 2017-02-26 MED ORDER — RANITIDINE HCL 150 MG PO CAPS: 150.0000 mg | ORAL_CAPSULE | Freq: Every day | ORAL | 0 refills | Status: DC

## 2017-02-26 MED ORDER — ALBUTEROL SULFATE HFA 108 (90 BASE) MCG/ACT IN AERS: 1.0000 | INHALATION_SPRAY | Freq: Four times a day (QID) | RESPIRATORY_TRACT | 0 refills | Status: DC | PRN

## 2017-02-26 NOTE — Discharge Instructions (Signed)
Take ranitidine once daily to help with pain. Avoid ibuprofen and other anti-inflammatories until pain is resolved. Try to eat bland foods, avoiding spicy, acidic, or fatty foods. Follow-up with [facility] for further evaluation of your pain. Return to the emergency room if you develop difficulty breathing, worsening pain, or any new or concerning symptoms.

## 2017-02-26 NOTE — ED Triage Notes (Signed)
Pt c/o midsternal chest pain/ tightness that started yesterday and continued today. Pt [facility] and she was d/c yesterday and reported the CP prior to d/c

## 2017-02-26 NOTE — ED Provider Notes (Signed)
[facility] EMERGENCY DEPARTMENT Provider Note   CSN: [id number] Arrival date & time: 02/26/17  1823     History   Chief Complaint Chief Complaint  Patient presents with  . Chest Pain    HPI Jaclyn Owens is a 22 y.o. female presenting for evaluation of chest pain.  Pt states that she developed chest pain after overdosing on Tylenol on the 27th.  She was evaluated at [facility] after this, and discharged home.  She reports she has a sharp pain in her chest with associated nausea.  She is not sure if this changes with p.o. intake.  She denies history of similar.  She denies fever, chills, nasal congestion, sore throat, cough, difficulty breathing, vomiting, abdominal pain, urinary symptoms, or abnormal bowel movements.  She denies cardiac history.  She denies leg pain or swelling.  Pain does not radiate.  Nothing makes it better or worse.  HPI  Past Medical History:  Diagnosis Date  . Asthma     Patient Active Problem List   Diagnosis Date Noted  . Adjustment disorder with depressed mood 02/24/2017  . Normal labor and delivery 08/06/2015  . Abdominal pain affecting pregnancy 06/02/2015  . Pregnancy 04/14/2015  . High risk pregnancy with low PAPPA (pregnancy-associated plasma protein A) 02/17/2015  . First trimester screening 02/10/2015    Past Surgical History:  Procedure Laterality Date  . ARM WOUND REPAIR / CLOSURE      OB History    Gravida Para Term Preterm AB Living   1 1 1  0 0 1   SAB TAB Ectopic Multiple Live Births   0 0 0 0 0       Home Medications    Prior to Admission medications   Medication Sig Start Date End Date Taking? Authorizing Provider  valACYclovir (VALTREX) 1000 MG tablet Take 1 tablet (1,000 mg total) by mouth 2 (two) times daily as needed (for outbreak). Patient taking differently: Take 1,000 mg by mouth 2 (two) times daily as needed (for outbreaks).  01/05/17  Yes Lorre Nick, MD  albuterol (PROVENTIL HFA;VENTOLIN  HFA) 108 (90 Base) MCG/ACT inhaler Inhale 1-2 puffs into the lungs every 6 (six) hours as needed for wheezing or shortness of breath. 02/26/17   Tyonna Talerico, PA-C  ranitidine (ZANTAC) 150 MG capsule Take 1 capsule (150 mg total) by mouth daily for 7 days. 02/26/17 03/05/17  Amelio Brosky, PA-C    Family History No family history on file.  Social History Social History   Tobacco Use  . Smoking status: Former Smoker    Types: Cigarettes  . Smokeless tobacco: Never Used  Substance Use Topics  . Alcohol use: No  . 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  Gastrointestinal: Positive for nausea.  All other systems reviewed and are negative.    Physical Exam Updated Vital Signs BP 109/73 (BP Location: Right Arm)   Pulse 82   Temp 98 F (36.7 C) (Oral)   Resp 16   Ht 5\' 6"  (1.676 m)   Wt 60.3 kg (133 lb)   LMP 02/13/2017 (Exact Date)   SpO2 100%   BMI 21.47 kg/m   Physical Exam  Constitutional: She is oriented to person, place, and time. She appears well-developed and well-nourished. No distress.  HENT:  Head: Normocephalic and atraumatic.  Mouth/Throat: Uvula is midline, oropharynx  is clear and moist and mucous membranes are normal.  Eyes: Conjunctivae and EOM are normal. Pupils are equal, round, and reactive to light.  Neck: Normal range of motion. Neck supple.  Cardiovascular: Normal rate and intact distal pulses. An irregular rhythm present.  Pulmonary/Chest: Effort normal and breath sounds normal. No respiratory distress. She has no wheezes. She exhibits no tenderness.  No TTP of chest wall.   Abdominal: Soft. Normal appearance. She exhibits no distension and no mass. There is tenderness in the epigastric area. There is no rigidity, no rebound, no guarding and no CVA tenderness.  TTP of epigastric abd.   Musculoskeletal: Normal range of motion.    Neurological: She is alert and oriented to person, place, and time.  Skin: Skin is warm and dry.  Psychiatric: She has a normal mood and affect.  Nursing note and vitals reviewed.    ED Treatments / Results  Labs (all labs ordered are listed, but only abnormal results are displayed) Labs Reviewed  CBC - Abnormal; Notable for the following components:      Result Value   Hemoglobin 11.8 (*)    HCT 35.5 (*)    All other components within normal limits  BASIC METABOLIC PANEL  I-STAT TROPONIN, ED  I-STAT BETA HCG BLOOD, ED ([facility], [facility], [facility] ONLY)    EKG  EKG Interpretation None       Radiology Dg Chest 2 View  Result Date: 02/26/2017 CLINICAL DATA:  Central chest pain, shortness of breath, dry cough EXAM: CHEST  2 VIEW COMPARISON:  04/10/2016 FINDINGS: Heart and mediastinal contours are within normal limits. No focal opacities or effusions. No acute bony abnormality. IMPRESSION: No active cardiopulmonary disease. Electronically Signed   By: Charlett NoseKevin  Dover M.D.   On: 02/26/2017 20:06    Procedures Procedures (including critical care time)  Medications Ordered in ED Medications  gi cocktail (Maalox,Lidocaine,Donnatal) (30 mLs Oral Given 02/26/17 2314)     Initial Impression / Assessment and Plan / ED Course  I have reviewed the triage vital signs and the nursing notes.  Pertinent labs & imaging results that were available during my care of the patient were reviewed by me and considered in my medical decision making (see chart for details).     Patient presenting for evaluation of chest pain.  Chest pain has been going on for several days.  She began after taking Tylenol, she was evaluated in the ER and discharged with no sign of cardiac distress.  EKG today shows no change, sinus arrhythmia.  Troponin negative.  Labs reassuring.  Chest x-ray negative.  Physical exam shows tenderness palpation of epigastric area.  Patient states she ate spicy food last night. ?reflux or other non  cardiac cause of the pain.  Doubt ACS, endocarditis, PE, pneumonia.  Will trial GI cocktail.  GI cocktail resolved pain.  Likely GERD.  Will give ranitidine and have patient follow-up for further evaluation.  At this time, patient appears safe for discharge.  Return precautions given.  Patient states she understands and agrees to plan.   Final Clinical Impressions(s) / ED Diagnoses   Final diagnoses:  Atypical chest pain    ED Discharge Orders        Ordered    ranitidine (ZANTAC) 150 MG capsule  Daily     02/26/17 2338    albuterol (PROVENTIL HFA;VENTOLIN HFA) 108 (90 Base) MCG/ACT inhaler  Every 6 hours PRN     02/26/17 2338       Savannaha Stonerock,  PA-C 02/26/17 2346    Linwood Dibbles, MD 02/27/17 [phone]

## 2017-05-19 ENCOUNTER — Emergency Department (HOSPITAL_COMMUNITY)
Admission: EM | Admit: 2017-05-19 | Discharge: 2017-05-20 | Disposition: A | Discharge status: Home / Self Care | Payer: Medicaid Other | Attending: Emergency Medicine | Admitting: Emergency Medicine

## 2017-05-19 ENCOUNTER — Encounter (HOSPITAL_COMMUNITY): Payer: Self-pay

## 2017-05-19 ENCOUNTER — Other Ambulatory Visit: Payer: Self-pay

## 2017-05-19 DIAGNOSIS — Z7251 High risk heterosexual behavior: Secondary | ICD-10-CM

## 2017-05-19 DIAGNOSIS — Z79899 Other long term (current) drug therapy: Secondary | ICD-10-CM | POA: Diagnosis not present

## 2017-05-19 DIAGNOSIS — H9201 Otalgia, right ear: Secondary | ICD-10-CM | POA: Diagnosis present

## 2017-05-19 DIAGNOSIS — H66001 Acute suppurative otitis media without spontaneous rupture of ear drum, right ear: Secondary | ICD-10-CM | POA: Diagnosis not present

## 2017-05-19 DIAGNOSIS — J45909 Unspecified asthma, uncomplicated: Secondary | ICD-10-CM | POA: Diagnosis not present

## 2017-05-19 DIAGNOSIS — Z87891 Personal history of nicotine dependence: Secondary | ICD-10-CM | POA: Diagnosis not present

## 2017-05-19 MED ORDER — AMOXICILLIN 500 MG PO CAPS: 500.0000 mg | ORAL_CAPSULE | Freq: Three times a day (TID) | ORAL | 0 refills | Status: DC

## 2017-05-19 NOTE — Discharge Instructions (Addendum)
Go to the Pharmacy and ask for PLAN B emergency contraception

## 2017-05-19 NOTE — ED Triage Notes (Signed)
Pt BIB [facility]. She is complaining of L sided otalgia x3 days. Also reports R sided abdominal discomfort x1 week. She denies N/V/D. A&Ox4. Ambulatory.

## 2017-05-19 NOTE — ED Triage Notes (Signed)
During triage, pt also reports that she is on her last day of treatment for a herpes outbreak, but is concerned because she feels that the outbreak is spreading form her labia to her vagina. She is requesting a pelvic exam as well.

## 2017-05-20 MED ORDER — VALACYCLOVIR HCL 1 G PO TABS: 1000.0000 mg | ORAL_TABLET | Freq: Two times a day (BID) | ORAL | 3 refills | Status: DC | PRN

## 2017-05-20 NOTE — ED Notes (Signed)
Patient very rude and stated her needs and complaints to me one at a time as soon as I opened her door to bring to the doctor. I did my best to fill her requests but the patient was not happy. Patient left without being properly discharged after the doctor came back to the room to see her.

## 2017-05-20 NOTE — ED Notes (Signed)
PT walked past triage and states, "they are pissing me off."

## 2017-05-20 NOTE — ED Provider Notes (Signed)
[facility]-EMERGENCY DEPT Provider Note   CSN: [id number] Arrival date & time: 05/19/17  1943     History   Chief Complaint Chief Complaint  Patient presents with  . Otalgia    R  . Abdominal Pain    R    HPI Jaclyn Owens is a 22 y.o. female.  HPI Patient reports of right ear otalgia over the past 3 days.  She has a history of ear infections.  She is concerned about the possibility of an infection.  No dental pain.  No sore throat.  No other complaints in regards to her right ear.  For nursing staff she reported that she was having herpes outbreak and that she wanted a pelvic exam during my examination she states she is only concerned because she had unprotected intercourse yesterday and is currently about to start ovulating and she is concerned that she could become pregnant.  She denies abdominal pain.  She denies nausea vomiting.   Past Medical History:  Diagnosis Date  . Asthma     Patient Active Problem List   Diagnosis Date Noted  . Adjustment disorder with depressed mood 02/24/2017  . Normal labor and delivery 08/06/2015  . Abdominal pain affecting pregnancy 06/02/2015  . Pregnancy 04/14/2015  . High risk pregnancy with low PAPPA (pregnancy-associated plasma protein A) 02/17/2015  . First trimester screening 02/10/2015    Past Surgical History:  Procedure Laterality Date  . ARM WOUND REPAIR / CLOSURE       OB History    Gravida  1   Para  1   Term  1   Preterm  0   AB  0   Living  1     SAB  0   TAB  0   Ectopic  0   Multiple  0   Live Births  0            Home Medications    Prior to Admission medications   Medication Sig Start Date End Date Taking? Authorizing Provider  QUEtiapine (SEROQUEL) 50 MG tablet TAKE 1 TAB BY MOUTH AT BEDTIME 04/15/17  Yes [provider]  sertraline (ZOLOFT) 50 MG tablet TAKE 1 TAB BY MOUTH EVERY MORNING 04/15/17  Yes [provider]  albuterol (PROVENTIL  HFA;VENTOLIN HFA) 108 (90 Base) MCG/ACT inhaler Inhale 1-2 puffs into the lungs every 6 (six) hours as needed for wheezing or shortness of breath. 02/26/17   Caccavale, Sophia, PA-C  amoxicillin (AMOXIL) 500 MG capsule Take 1 capsule (500 mg total) by mouth 3 (three) times daily. 05/19/17   Azalia Bilis, MD  ranitidine (ZANTAC) 150 MG capsule Take 1 capsule (150 mg total) by mouth daily for 7 days. 02/26/17 03/05/17  Caccavale, Sophia, PA-C  valACYclovir (VALTREX) 1000 MG tablet Take 1 tablet (1,000 mg total) by mouth 2 (two) times daily as needed (for outbreak). Patient taking differently: Take 1,000 mg by mouth 2 (two) times daily as needed (for outbreaks).  01/05/17   Lorre Nick, MD    Family History History reviewed. No pertinent family history.  Social History Social History   Tobacco Use  . Smoking status: Former Smoker    Types: Cigarettes  . Smokeless tobacco: Never Used  Substance Use Topics  . Alcohol use: No  . Drug use: No    Types: Marijuana    Comment: MJ use 01-24-15     Allergies   Bee venom   Review of Systems Review of Systems  All  other systems reviewed and are negative.    Physical Exam Updated Vital Signs BP 104/87 (BP Location: Left Arm)   Pulse 67   Temp 98.6 F (37 C) (Oral)   Resp 18   Ht 5\' 6"  (1.676 m)   Wt 61.2 kg (135 lb)   SpO2 100%   BMI 21.79 kg/m   Physical Exam  Constitutional: She is oriented to person, place, and time. She appears well-developed and well-nourished.  HENT:  Head: Normocephalic.  Left Ear: External ear normal.  Erythema of the right TM.  No cerumen impaction.  No evidence of tympanic membrane perforation.  No mastoid tenderness on the right  Eyes: EOM are normal.  Neck: Normal range of motion. Neck supple.  Pulmonary/Chest: Effort normal and breath sounds normal.  Abdominal: Soft. She exhibits no distension. There is no tenderness.  Musculoskeletal: Normal range of motion.  Neurological: She is alert and  oriented to person, place, and time.  Psychiatric: She has a normal mood and affect.  Nursing note and vitals reviewed.    ED Treatments / Results  Labs (all labs ordered are listed, but only abnormal results are displayed) Labs Reviewed  URINALYSIS, ROUTINE W REFLEX MICROSCOPIC  I-STAT BETA HCG BLOOD, ED ([facility], [facility], [facility] ONLY)    EKG None  Radiology No results found.  Procedures Procedures (including critical care time)  Medications Ordered in ED Medications - No data to display   Initial Impression / Assessment and Plan / ED Course  I have reviewed the triage vital signs and the nursing notes.  Pertinent labs & imaging results that were available during my care of the patient were reviewed by me and considered in my medical decision making (see chart for details).     Abdominal exam is benign.  I recommended that she go to the pharmacy and ask for plan B emergency contraception given her concerns about unprotected intercourse yesterday.  She now is requesting a refill of her Valtrex.  This will be refilled.  Primary care follow-up.  She understands return to the ER for new or worsening symptoms  She will be treated with antibiotics for an acute otitis media on the right.  Final Clinical Impressions(s) / ED Diagnoses   Final diagnoses:  Non-recurrent acute suppurative otitis media of right ear without spontaneous rupture of tympanic membrane  Unprotected sexual intercourse    ED Discharge Orders        Ordered    amoxicillin (AMOXIL) 500 MG capsule  3 times daily     05/19/17 2354       Azalia Bilisampos, Chief Walkup, MD 05/20/17 0010

## 2017-06-02 ENCOUNTER — Encounter (HOSPITAL_COMMUNITY): Payer: Self-pay

## 2017-06-02 ENCOUNTER — Inpatient Hospital Stay (HOSPITAL_COMMUNITY): Payer: Medicaid Other

## 2017-06-02 ENCOUNTER — Inpatient Hospital Stay (HOSPITAL_COMMUNITY)
Admission: AD | Admit: 2017-06-02 | Discharge: 2017-06-02 | Disposition: A | Discharge status: Home / Self Care | Payer: Medicaid Other | Source: Ambulatory Visit | Attending: Obstetrics & Gynecology | Admitting: Obstetrics & Gynecology

## 2017-06-02 DIAGNOSIS — R102 Pelvic and perineal pain: Secondary | ICD-10-CM | POA: Diagnosis present

## 2017-06-02 DIAGNOSIS — Z87891 Personal history of nicotine dependence: Secondary | ICD-10-CM | POA: Insufficient documentation

## 2017-06-02 DIAGNOSIS — O26891 Other specified pregnancy related conditions, first trimester: Secondary | ICD-10-CM

## 2017-06-02 DIAGNOSIS — O3680X0 Pregnancy with inconclusive fetal viability, not applicable or unspecified: Secondary | ICD-10-CM

## 2017-06-02 DIAGNOSIS — Z3A01 Less than 8 weeks gestation of pregnancy: Secondary | ICD-10-CM

## 2017-06-02 DIAGNOSIS — O26899 Other specified pregnancy related conditions, unspecified trimester: Secondary | ICD-10-CM

## 2017-06-02 DIAGNOSIS — R109 Unspecified abdominal pain: Secondary | ICD-10-CM

## 2017-06-02 LAB — CBC
HEMATOCRIT: 35.1 % — AB (ref 36.0–46.0)
HEMOGLOBIN: 11.9 g/dL — AB (ref 12.0–15.0)
MCH: 29.3 pg (ref 26.0–34.0)
MCHC: 33.9 g/dL (ref 30.0–36.0)
MCV: 86.5 fL (ref 78.0–100.0)
Platelets: 242 10*3/uL (ref 150–400)
RBC: 4.06 MIL/uL (ref 3.87–5.11)
RDW: 12.8 % (ref 11.5–15.5)
WBC: 7.2 10*3/uL (ref 4.0–10.5)

## 2017-06-02 LAB — URINALYSIS, ROUTINE W REFLEX MICROSCOPIC
Bilirubin Urine: NEGATIVE
GLUCOSE, UA: NEGATIVE mg/dL
Hgb urine dipstick: NEGATIVE
KETONES UR: NEGATIVE mg/dL
LEUKOCYTES UA: NEGATIVE
Nitrite: NEGATIVE
PH: 6 (ref 5.0–8.0)
Protein, ur: NEGATIVE mg/dL
SPECIFIC GRAVITY, URINE: 1.026 (ref 1.005–1.030)

## 2017-06-02 LAB — WET PREP, GENITAL
CLUE CELLS WET PREP: NONE SEEN
SPERM: NONE SEEN
TRICH WET PREP: NONE SEEN
Yeast Wet Prep HPF POC: NONE SEEN

## 2017-06-02 LAB — POCT PREGNANCY, URINE: Preg Test, Ur: POSITIVE — AB

## 2017-06-02 LAB — HCG, QUANTITATIVE, PREGNANCY: hCG, Beta Chain, Quant, S: 163 m[IU]/mL — ABNORMAL HIGH (ref <5)

## 2017-06-02 NOTE — MAU Note (Signed)
Pt reports lower abdominal pain that started last week. Nausea and vomiting as well.  Had a +upt at home. Pt denies bleeding. LMP: 05/08/2017

## 2017-06-02 NOTE — MAU Provider Note (Addendum)
History     CSN: [id number]  Arrival date and time: 06/02/17 4098   First Provider Initiated Contact with Patient 06/02/17 2011      Chief Complaint  Patient presents with  . Pelvic Pain   Pelvic Pain  The patient's primary symptoms include pelvic pain. This is a new problem. The current episode started in the past 7 days. The problem occurs constantly. The problem has been unchanged. Pain severity now: 7/10. The problem affects both sides. She is pregnant. Pertinent negatives include no chills, fever, nausea or vomiting. The vaginal discharge was normal. Nothing aggravates the symptoms. She has tried nothing for the symptoms. She is sexually active. She uses nothing for contraception. Her menstrual history has been regular (LMP 05/08/17).     Past Medical History:  Diagnosis Date  . Asthma     Past Surgical History:  Procedure Laterality Date  . ARM WOUND REPAIR / CLOSURE      No family history on file.  Social History   Tobacco Use  . Smoking status: Former Smoker    Types: Cigarettes  . Smokeless tobacco: Never Used  Substance Use Topics  . Alcohol use: No  . 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 Bee Venom Anaphylaxis    Medications Prior to Admission  Medication Sig Dispense Refill Last Dose  . QUEtiapine (SEROQUEL) 50 MG tablet TAKE 1 TAB BY MOUTH AT BEDTIME  1 06/01/2017 at Unknown time  . sertraline (ZOLOFT) 50 MG tablet TAKE 1 TAB BY MOUTH EVERY MORNING  1 06/01/2017 at Unknown time  . valACYclovir (VALTREX) 1000 MG tablet Take 1 tablet (1,000 mg total) by mouth 2 (two) times daily as needed (for outbreak). 10 tablet 3 Past Month at Unknown time  . albuterol (PROVENTIL HFA;VENTOLIN HFA) 108 (90 Base) MCG/ACT inhaler Inhale 1-2 puffs into the lungs every 6 (six) hours as needed for wheezing or shortness of breath. 1 Inhaler 0 unknown  . amoxicillin (AMOXIL) 500 MG capsule Take 1 capsule  (500 mg total) by mouth 3 (three) times daily. 21 capsule 0   . ranitidine (ZANTAC) 150 MG capsule Take 1 capsule (150 mg total) by mouth daily for 7 days. 7 capsule 0     Review of Systems  Constitutional: Negative for chills and fever.  Gastrointestinal: Negative for nausea and vomiting.  Genitourinary: Positive for pelvic pain.   Physical Exam   Blood pressure 128/67, pulse 83, temperature 98.1 F (36.7 C), temperature source Oral, resp. rate 18, height  (1.676 m), weight 135 lb (61.2 kg), last menstrual period 05/08/2017, SpO2 99 %, unknown if currently breastfeeding.  Physical Exam  Nursing note and vitals reviewed. Constitutional: She is oriented to person, place, and time. She appears well-developed and well-nourished. No distress.  HENT:  Head: Normocephalic.  Cardiovascular: Normal rate.  Respiratory: Effort normal.  GI: Soft. There is no tenderness. There is no rebound.  Neurological: She is alert and oriented to person, place, and time.  Skin: Skin is warm and dry.  Psychiatric: She has a normal mood and affect.    MAU Course  Procedures Results for orders placed or performed during the hospital encounter of 06/02/17 (from the past 24 hour(s))  Urinalysis, Routine w reflex microscopic     Status: Abnormal   Collection Time: 06/02/17  7:15 PM  Result Value Ref Range   Color, Urine YELLOW YELLOW  APPearance HAZY (A) CLEAR   Specific Gravity, Urine 1.026 1.005 - 1.030   pH 6.0 5.0 - 8.0   Glucose, UA NEGATIVE NEGATIVE mg/dL   Hgb urine dipstick NEGATIVE NEGATIVE   Bilirubin Urine NEGATIVE NEGATIVE   Ketones, ur NEGATIVE NEGATIVE mg/dL   Protein, ur NEGATIVE NEGATIVE mg/dL   Nitrite NEGATIVE NEGATIVE   Leukocytes, UA NEGATIVE NEGATIVE  Pregnancy, urine POC     Status: Abnormal   Collection Time: 06/02/17  7:51 PM  Result Value Ref Range   Preg Test, Ur POSITIVE (A) NEGATIVE  CBC     Status: Abnormal   Collection Time: 06/02/17  8:17 PM  Result Value Ref  Range   WBC 7.2 4.0 - 10.5 K/uL   RBC 4.06 3.87 - 5.11 MIL/uL   Hemoglobin 11.9 (L) 12.0 - 15.0 g/dL   HCT 16.1 (L) 09.6 - 04.5 %   MCV 86.5 78.0 - 100.0 fL   MCH 29.3 26.0 - 34.0 pg   MCHC 33.9 30.0 - 36.0 g/dL   RDW 40.9 81.1 - 91.4 %   Platelets 242 150 - 400 K/uL  hCG, quantitative, pregnancy     Status: Abnormal   Collection Time: 06/02/17  8:17 PM  Result Value Ref Range   hCG, Beta Chain, Quant, S 163 (H) <5 mIU/mL  Wet prep, genital     Status: Abnormal   Collection Time: 06/02/17  8:20 PM  Result Value Ref Range   Yeast Wet Prep HPF POC NONE SEEN NONE SEEN   Trich, Wet Prep NONE SEEN NONE SEEN   Clue Cells Wet Prep HPF POC NONE SEEN NONE SEEN   WBC, Wet Prep HPF POC MODERATE (A) NONE SEEN   Sperm NONE SEEN    US Ob Comp Less 14 Wks  Result Date: 06/02/2017 CLINICAL DATA:  Pelvic pain. Last menstrual period 05/08/2017. Quantitative beta HCG 163. EXAM: OBSTETRIC <14 WK [place] AND TRANSVAGINAL OB US TECHNIQUE: Both transabdominal and transvaginal ultrasound examinations were performed for complete evaluation of the gestation as well as the maternal uterus, adnexal regions, and pelvic cul-de-sac. Transvaginal technique was performed to assess early pregnancy. COMPARISON:  None for this pregnancy. FINDINGS: Intrauterine gestational sac: None Maternal uterus/adnexae: Normal appearance of the right ovary. 3.0 cm hemorrhagic cyst versus corpus luteum on the left ovary. Trace amount of free pelvic fluid. IMPRESSION: No intrauterine pregnancy seen, however given patient's last menstrual, a gestational sac may not yet be visible sonographically. Normal appearance of the right ovary. 3 cm hemorrhagic cyst versus corpus luteum on the left ovary. Electronically Signed   By: Ted Mcalpine M.D.   On: 06/02/2017 21:58   US Ob Transvaginal  Result Date: 06/02/2017 CLINICAL DATA:  Pelvic pain. Last menstrual period 05/08/2017. Quantitative beta HCG 163. EXAM: OBSTETRIC <14 WK [place] AND TRANSVAGINAL  OB US TECHNIQUE: Both transabdominal and transvaginal ultrasound examinations were performed for complete evaluation of the gestation as well as the maternal uterus, adnexal regions, and pelvic cul-de-sac. Transvaginal technique was performed to assess early pregnancy. COMPARISON:  None for this pregnancy. FINDINGS: Intrauterine gestational sac: None Maternal uterus/adnexae: Normal appearance of the right ovary. 3.0 cm hemorrhagic cyst versus corpus luteum on the left ovary. Trace amount of free pelvic fluid. IMPRESSION: No intrauterine pregnancy seen, however given patient's last menstrual, a gestational sac may not yet be visible sonographically. Normal appearance of the right ovary. 3 cm hemorrhagic cyst versus corpus luteum on the left ovary. Electronically Signed   By: Ulanda Edison.D.  On: 06/02/2017 21:58   MDM 2100: Care turned over to Cleone Slim, CNM [place] and lab work pending Thressa Sheller 8:46 PM 06/02/17   Assessment and Plan   1. Pregnancy of unknown anatomic location   2. [redacted] weeks gestation of pregnancy   3. Abdominal pain affecting pregnancy    -Discharge home in stable condition -Strict ectopic precautions discussed -Patient advised to follow-up with [facility] on Wednesday for repeat bHCG -Patient may return to MAU as needed or if her condition were to change or worsen  Rolm Bookbinder, CNM 06/02/17 10:11 PM

## 2017-06-02 NOTE — Discharge Instructions (Signed)

## 2017-06-03 LAB — GC/CHLAMYDIA PROBE AMP (~~LOC~~) NOT AT ARMC
CHLAMYDIA, DNA PROBE: NEGATIVE
Neisseria Gonorrhea: NEGATIVE

## 2017-06-05 ENCOUNTER — Ambulatory Visit: Payer: Medicaid Other | Admitting: *Deleted

## 2017-06-05 DIAGNOSIS — O3680X0 Pregnancy with inconclusive fetal viability, not applicable or unspecified: Secondary | ICD-10-CM

## 2017-06-05 LAB — HCG, QUANTITATIVE, PREGNANCY: HCG, BETA CHAIN, QUANT, S: 785 m[IU]/mL — AB (ref <5)

## 2017-06-05 NOTE — Progress Notes (Signed)
Patient presents to clinic for stat beta hcg. States she is no longer having pain aside from some intermittent light cramping. Feels like this is probably normal for early pregnancy. No bleeding. Feels tired. Explained to patient that she will need to stay for results of blood draw so that her plan of care can be updated based on that result. Patient voiced understanding. No further questions at this time. 1635 Results reviewed with Dr Jolayne Panther. Orders received for pt to have an u/s 7 days after the first was done. U/s scheduled for 5/13 . Discussed results with patient, appt given. Reviewed ectopic precautions. Patient voiced understanding.

## 2017-06-06 NOTE — Progress Notes (Signed)
I have reviewed the chart and agree with nursing staff's documentation of this patient's encounter.  Catalina Antigua, MD 06/06/2017 6:39 AM

## 2017-06-10 ENCOUNTER — Ambulatory Visit (HOSPITAL_COMMUNITY): Payer: Medicaid Other

## 2017-06-11 ENCOUNTER — Ambulatory Visit (INDEPENDENT_AMBULATORY_CARE_PROVIDER_SITE_OTHER): Payer: Medicaid Other | Admitting: *Deleted

## 2017-06-11 ENCOUNTER — Ambulatory Visit (HOSPITAL_COMMUNITY)
Admission: RE | Admit: 2017-06-11 | Discharge: 2017-06-11 | Disposition: A | Discharge status: Home / Self Care | Payer: Medicaid Other | Source: Ambulatory Visit | Attending: Obstetrics and Gynecology | Admitting: Obstetrics and Gynecology

## 2017-06-11 DIAGNOSIS — Z712 Person consulting for explanation of examination or test findings: Secondary | ICD-10-CM

## 2017-06-11 DIAGNOSIS — N83202 Unspecified ovarian cyst, left side: Secondary | ICD-10-CM | POA: Insufficient documentation

## 2017-06-11 DIAGNOSIS — Z3A01 Less than 8 weeks gestation of pregnancy: Secondary | ICD-10-CM | POA: Insufficient documentation

## 2017-06-11 DIAGNOSIS — O3680X0 Pregnancy with inconclusive fetal viability, not applicable or unspecified: Secondary | ICD-10-CM

## 2017-06-11 DIAGNOSIS — O3680X1 Pregnancy with inconclusive fetal viability, fetus 1: Secondary | ICD-10-CM

## 2017-06-11 DIAGNOSIS — O283 Abnormal ultrasonic finding on antenatal screening of mother: Secondary | ICD-10-CM | POA: Diagnosis not present

## 2017-06-11 NOTE — Progress Notes (Signed)
Chart reviewed for nurse visit. Agree with plan of care.   Phelps, Jazma Y, DO 06/11/2017 3:49 PM   

## 2017-06-11 NOTE — Progress Notes (Signed)
[place] results reviewed by Dr. Doroteo Glassman.  Pt informed of results and advised of need for repeat US in 10-14 days to assess pregnancy progression. Pt voiced understanding and agreed to [place] appt on 5/28 @ 1000.

## 2017-06-21 ENCOUNTER — Encounter: Payer: Self-pay | Admitting: Obstetrics and Gynecology

## 2017-06-21 ENCOUNTER — Ambulatory Visit (INDEPENDENT_AMBULATORY_CARE_PROVIDER_SITE_OTHER): Payer: Medicaid Other | Admitting: General Practice

## 2017-06-21 ENCOUNTER — Ambulatory Visit (HOSPITAL_COMMUNITY)
Admission: RE | Admit: 2017-06-21 | Discharge: 2017-06-21 | Disposition: A | Discharge status: Home / Self Care | Payer: Medicaid Other | Source: Ambulatory Visit | Attending: Obstetrics and Gynecology | Admitting: Obstetrics and Gynecology

## 2017-06-21 DIAGNOSIS — O208 Other hemorrhage in early pregnancy: Secondary | ICD-10-CM | POA: Diagnosis not present

## 2017-06-21 DIAGNOSIS — Z369 Encounter for antenatal screening, unspecified: Secondary | ICD-10-CM | POA: Insufficient documentation

## 2017-06-21 DIAGNOSIS — Z3A01 Less than 8 weeks gestation of pregnancy: Secondary | ICD-10-CM | POA: Insufficient documentation

## 2017-06-21 DIAGNOSIS — O3680X1 Pregnancy with inconclusive fetal viability, fetus 1: Secondary | ICD-10-CM

## 2017-06-21 DIAGNOSIS — Z712 Person consulting for explanation of examination or test findings: Secondary | ICD-10-CM

## 2017-06-21 NOTE — Progress Notes (Signed)
Chart reviewed for nurse visit. Agree with plan of care.   Pincus Large, DO 06/21/2017 11:21 AM

## 2017-06-21 NOTE — Progress Notes (Signed)
Patient presents to office today for ultrasound results. Reviewed results with Dr Doroteo Glassman who finds living IUP, patient should begin prenatal care.   Informed patient of results, reviewed dating, & provided pictures. Recommended she begin taking prenatal vitamins & start OB care around 12 weeks. Patient verbalized understanding & had no questions.

## 2017-06-25 ENCOUNTER — Ambulatory Visit (HOSPITAL_COMMUNITY): Payer: Medicaid Other

## 2017-07-01 IMAGING — US US OB COMP LESS 14 WK
1 series · 14 of 28 positions shown · non-contrast
Comparison: None.

CLINICAL DATA: LEFT lower quadrant pain for 1 day. Leukocytosis.
Beta HCG [DATE]. Gestational age by last menstrual period August 18, 2015.

EXAM:
OBSTETRIC <14 WK ULTRASOUND
TECHNIQUE: Transabdominal ultrasound was performed for evaluation of the
gestation as well as the maternal uterus and adnexal regions.

[Series 1: us ob comp less 14 wk · 0.19mm/px · 14 of 52 slices shown]
[im 2/52]
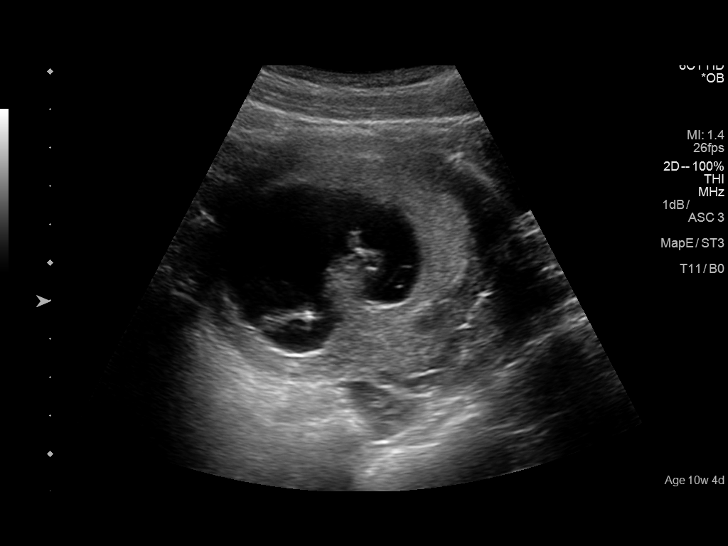
[im 6/52]
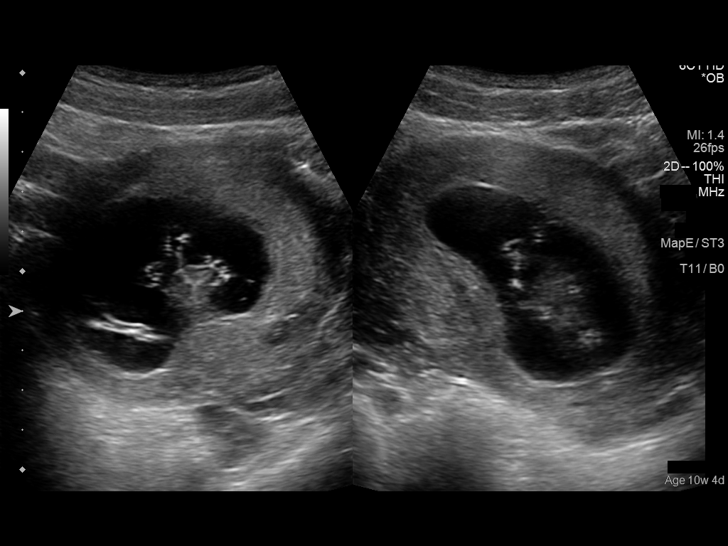
[im 10/52]
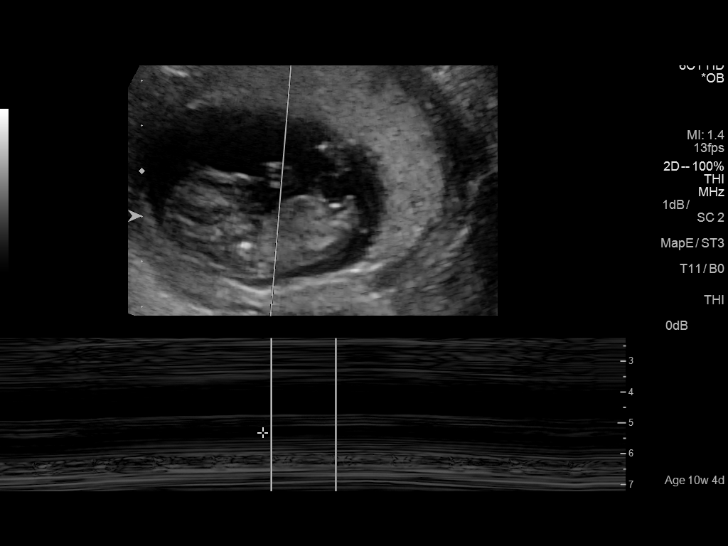
[im 14/52]
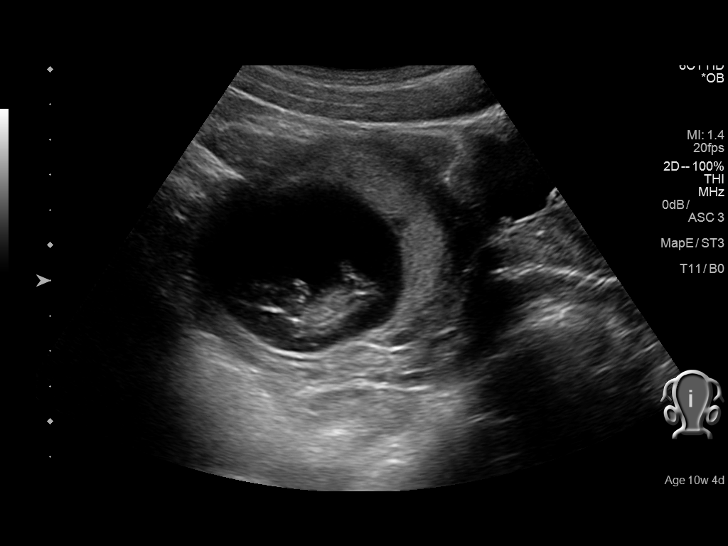
[im 18/52]
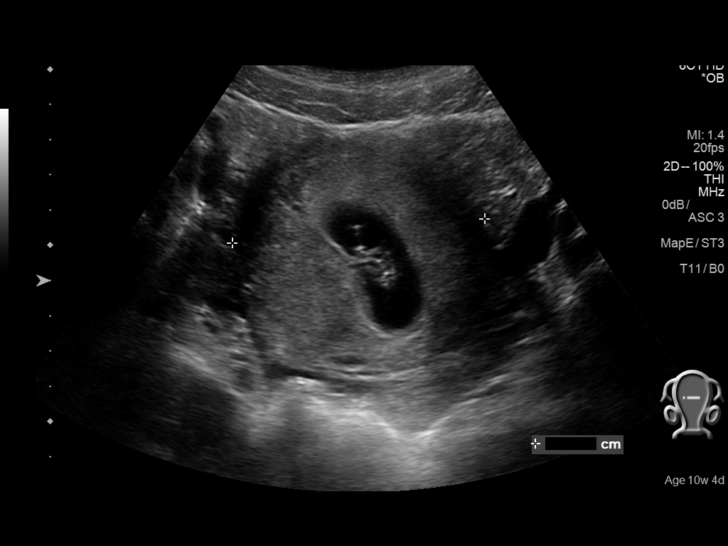
[im 21/52]
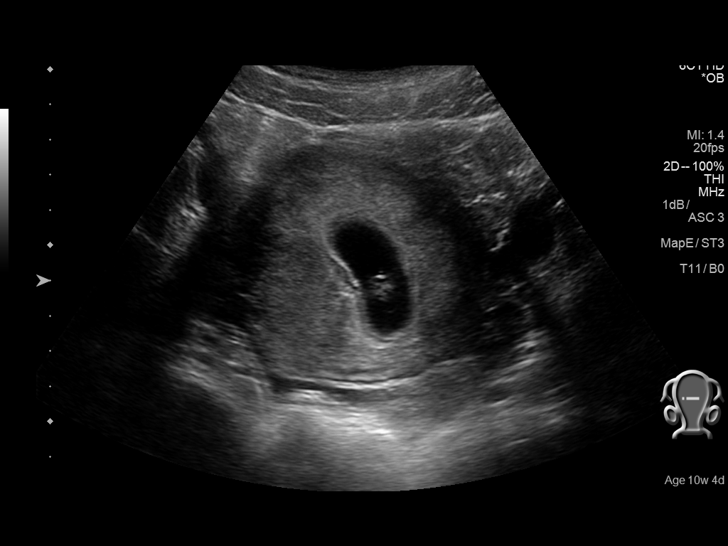
[im 25/52]
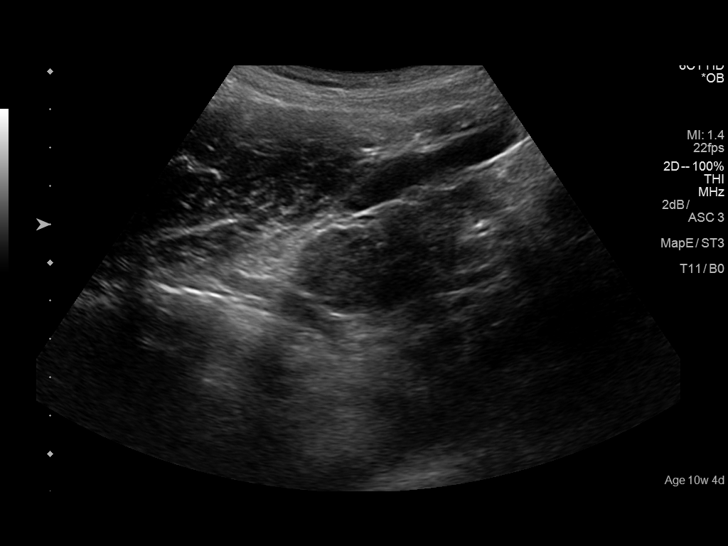
[im 29/52]
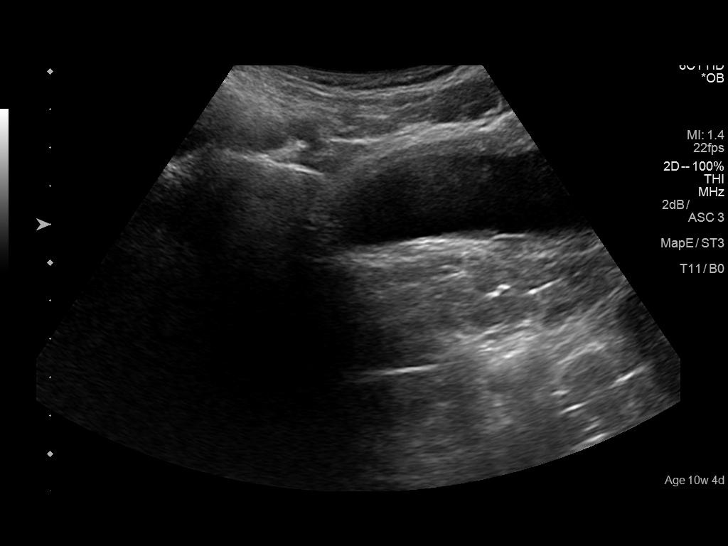
[im 33/52]
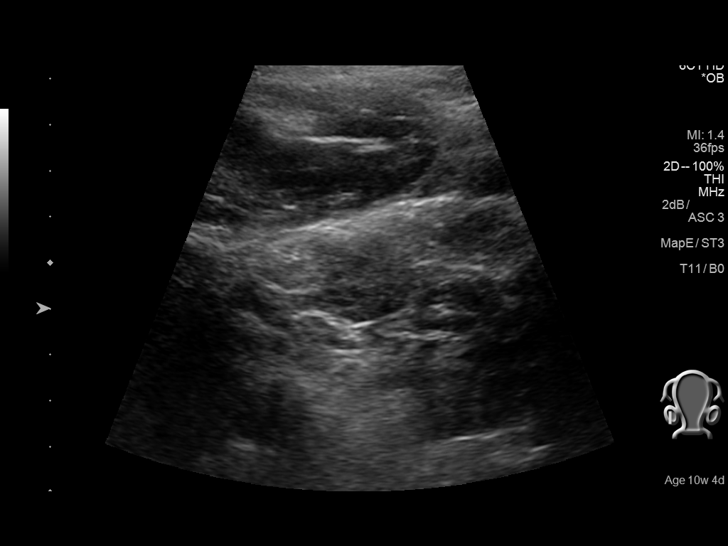
[im 36/52]
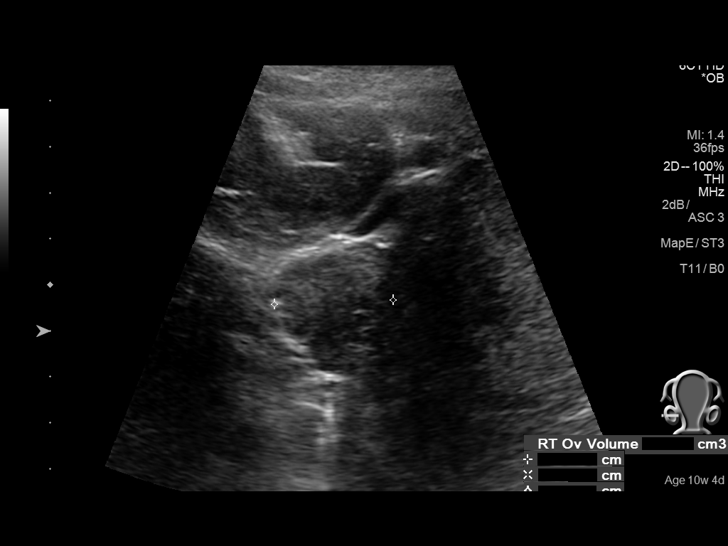
[im 40/52]
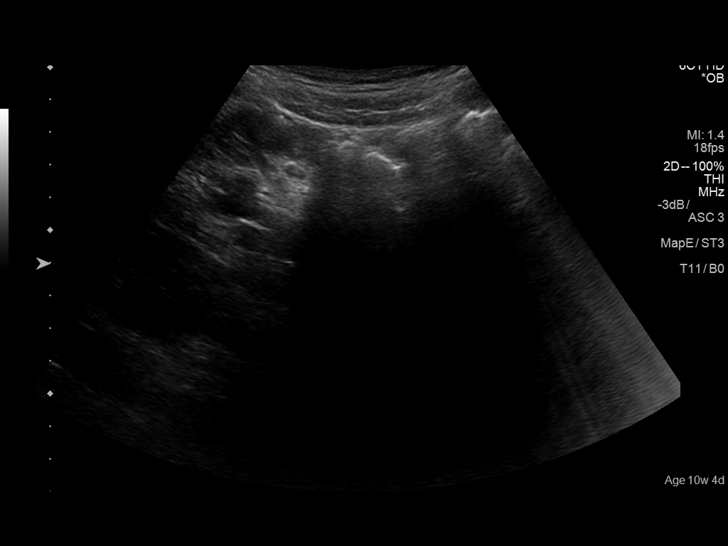
[im 44/52]
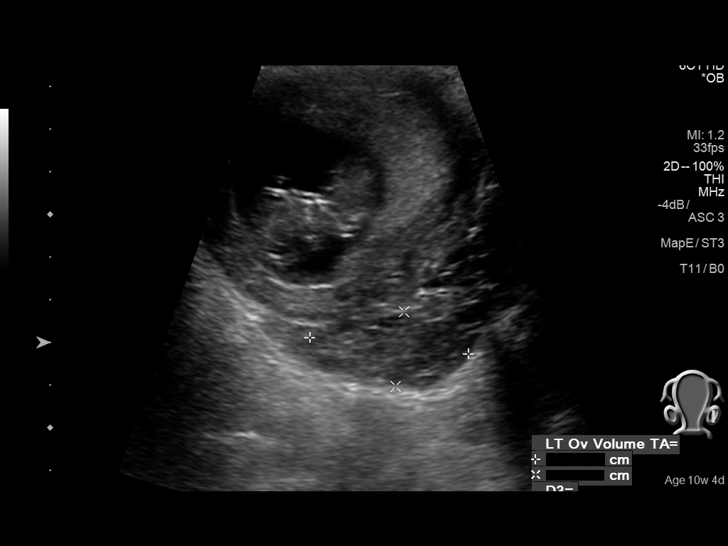
[im 48/52]
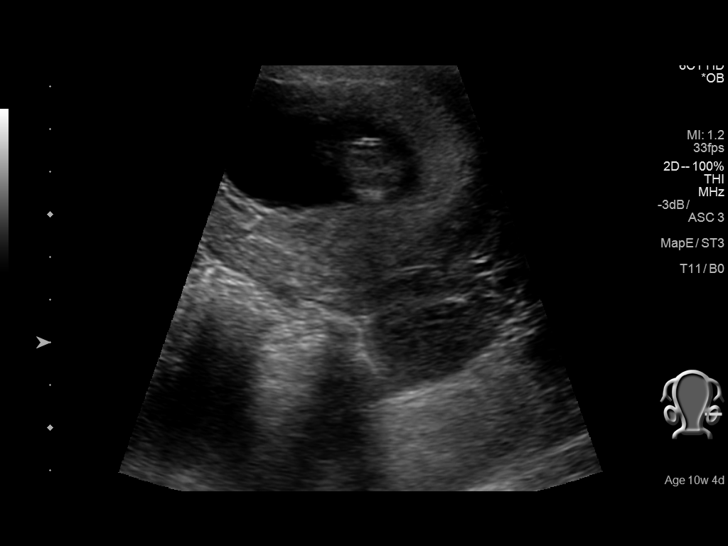
[im 52/52]
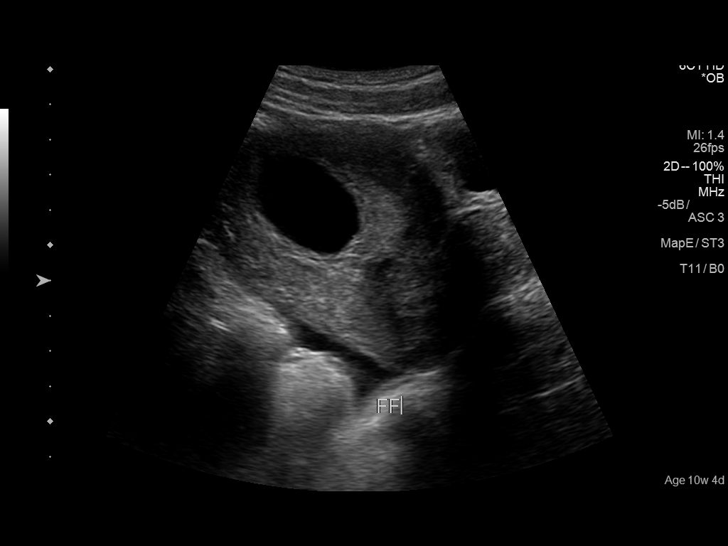

[14 of 28 positions shown; findings below may reference images not displayed]

FINDINGS: Intrauterine gestational sac: Visualized/normal in shape.

Yolk sac:  Not present

Embryo:  Present

Cardiac Activity: Present

Heart Rate: 158 bpm

CRL:   45  mm   11 w 2 d                  US EDC: August 13, 2015

Subchorionic hemorrhage:  None visualized.

Maternal uterus/adnexae: Normal appearance of the ovaries. Small
amount of free fluid.
IMPRESSION: Single live intrauterine pregnancy. Gestational age by ultrasound 11
weeks and 2 days without immediate complication.

## 2017-07-02 ENCOUNTER — Emergency Department (HOSPITAL_COMMUNITY)
Admission: EM | Admit: 2017-07-02 | Discharge: 2017-07-02 | Disposition: A | Discharge status: Home / Self Care | Payer: Medicaid Other | Attending: Emergency Medicine | Admitting: Emergency Medicine

## 2017-07-02 ENCOUNTER — Encounter (HOSPITAL_COMMUNITY): Payer: Self-pay | Admitting: Emergency Medicine

## 2017-07-02 DIAGNOSIS — Z79899 Other long term (current) drug therapy: Secondary | ICD-10-CM | POA: Insufficient documentation

## 2017-07-02 DIAGNOSIS — Z87891 Personal history of nicotine dependence: Secondary | ICD-10-CM | POA: Insufficient documentation

## 2017-07-02 DIAGNOSIS — G43909 Migraine, unspecified, not intractable, without status migrainosus: Secondary | ICD-10-CM

## 2017-07-02 DIAGNOSIS — R51 Headache: Secondary | ICD-10-CM | POA: Diagnosis present

## 2017-07-02 DIAGNOSIS — J45909 Unspecified asthma, uncomplicated: Secondary | ICD-10-CM | POA: Diagnosis not present

## 2017-07-02 MED ORDER — ONDANSETRON 4 MG PO TBDP
4.0000 mg | ORAL_TABLET | Freq: Once | ORAL | Status: AC
  Administered 2017-07-02: 4 mg via ORAL
  Filled 2017-07-02: qty 1

## 2017-07-02 MED ORDER — METOCLOPRAMIDE HCL 10 MG PO TABS
5.0000 mg | ORAL_TABLET | Freq: Once | ORAL | Status: AC
  Administered 2017-07-02: 5 mg via ORAL
  Filled 2017-07-02: qty 1

## 2017-07-02 MED ORDER — ACETAMINOPHEN 500 MG PO TABS
1000.0000 mg | ORAL_TABLET | Freq: Once | ORAL | Status: AC
  Administered 2017-07-02: 1000 mg via ORAL
  Filled 2017-07-02: qty 2

## 2017-07-02 MED ORDER — DIPHENHYDRAMINE HCL 25 MG PO CAPS
25.0000 mg | ORAL_CAPSULE | Freq: Once | ORAL | Status: AC
  Administered 2017-07-02: 25 mg via ORAL
  Filled 2017-07-02: qty 1

## 2017-07-02 MED ORDER — VITAMIN B-6 25 MG PO TABS: 25.0000 mg | ORAL_TABLET | Freq: Three times a day (TID) | ORAL | 0 refills | Status: DC | PRN

## 2017-07-02 NOTE — Discharge Instructions (Addendum)
Please read attached information. If you experience any new or worsening signs or symptoms please return to the emergency room for evaluation. Please follow-up with your primary care provider or specialist as discussed. Please use medication prescribed only as directed and discontinue taking if you have any concerning signs or symptoms.   °

## 2017-07-02 NOTE — ED Provider Notes (Signed)
[facility] EMERGENCY DEPARTMENT Provider Note   CSN: [id number] Arrival date & time: 07/02/17  1040     History   Chief Complaint Chief Complaint  Patient presents with  . Migraine    HPI Jaclyn Owens is a 22 y.o. female.  HPI   22 year old female presents today with complaints of migraine.  Patient notes history of migraines previously, she notes that recently she has not had any significant episodes.  She notes that 3 days ago she developed pain in the bilateral temples, with no radiation of symptoms.  She notes this has remained constant throughout the day, she denies any vision changes neurological deficits, fever, neck stiffness, trauma.  Patient denies photophobia or phonophobia.  She reports taking ibuprofen yesterday she did not have Tylenol at the house which did not improve her symptoms.  Patient notes she is had some nausea with an episode of vomiting today.  She received Zofran prior to my evaluation which improved her nausea.  No abdominal complaints or pregnancy related complaints.  Past Medical History:  Diagnosis Date  . Asthma     Patient Active Problem List   Diagnosis Date Noted  . Adjustment disorder with depressed mood 02/24/2017  . Normal labor and delivery 08/06/2015  . Abdominal pain affecting pregnancy 06/02/2015  . Pregnancy 04/14/2015  . High risk pregnancy with low PAPPA (pregnancy-associated plasma protein A) 02/17/2015  . First trimester screening 02/10/2015    Past Surgical History:  Procedure Laterality Date  . ARM WOUND REPAIR / CLOSURE       OB History    Gravida  2   Para  1   Term  1   Preterm  0   AB  0   Living  1     SAB  0   TAB  0   Ectopic  0   Multiple  0   Live Births  0            Home Medications    Prior to Admission medications   Medication Sig Start Date End Date Taking? Authorizing Provider  albuterol (PROVENTIL HFA;VENTOLIN HFA) 108 (90 Base) MCG/ACT inhaler Inhale 1-2  puffs into the lungs every 6 (six) hours as needed for wheezing or shortness of breath. 02/26/17   Caccavale, Sophia, PA-C  amoxicillin (AMOXIL) 500 MG capsule Take 1 capsule (500 mg total) by mouth 3 (three) times daily. 05/19/17   Azalia Bilis, MD  QUEtiapine (SEROQUEL) 50 MG tablet TAKE 1 TAB BY MOUTH AT BEDTIME 04/15/17   [provider]  ranitidine (ZANTAC) 150 MG capsule Take 1 capsule (150 mg total) by mouth daily for 7 days. 02/26/17 03/05/17  Caccavale, Sophia, PA-C  sertraline (ZOLOFT) 50 MG tablet TAKE 1 TAB BY MOUTH EVERY MORNING 04/15/17   [provider]  valACYclovir (VALTREX) 1000 MG tablet Take 1 tablet (1,000 mg total) by mouth 2 (two) times daily as needed (for outbreak). 05/20/17   Azalia Bilis, MD  vitamin B-6 (PYRIDOXINE) 25 MG tablet Take 1 tablet (25 mg total) by mouth every 8 (eight) hours as needed. As needed for nausea 07/02/17   Casyn Becvar, Tinnie Gens, PA-C    Family History No family history on file.  Social History Social History   Tobacco Use  . Smoking status: Former Smoker    Types: Cigarettes  . Smokeless tobacco: Never Used  Substance Use Topics  . Alcohol use: No  . Drug use: No    Types: Marijuana    Comment:  MJ use 01-24-15     Allergies   Bee venom   Review of Systems Review of Systems  All other systems reviewed and are negative.  Physical Exam Updated Vital Signs BP 94/74 (BP Location: Right Arm)   Pulse 88   Temp 98 F (36.7 C) (Oral)   Resp 14   LMP 05/08/2017   SpO2 100%   Physical Exam  Constitutional: She is oriented to person, place, and time. She appears well-developed and well-nourished.  HENT:  Head: Normocephalic and atraumatic.  Eyes: Pupils are equal, round, and reactive to light. Conjunctivae are normal. Right eye exhibits no discharge. Left eye exhibits no discharge. No scleral icterus.  Neck: Normal range of motion. No JVD present. No tracheal deviation present.  Pulmonary/Chest: Effort normal. No stridor.    Neurological: She is alert and oriented to person, place, and time. She has normal strength. No cranial nerve deficit or sensory deficit. Coordination and gait normal. GCS eye subscore is 4. GCS verbal subscore is 5. GCS motor subscore is 6.  Psychiatric: She has a normal mood and affect. Her behavior is normal. Judgment and thought content normal.  Nursing note and vitals reviewed.    ED Treatments / Results  Labs (all labs ordered are listed, but only abnormal results are displayed) Labs Reviewed - No data to display  EKG None  Radiology No results found.  Procedures Procedures (including critical care time)  Medications Ordered in ED Medications  ondansetron (ZOFRAN-ODT) disintegrating tablet 4 mg (4 mg Oral Given 07/02/17 1059)  metoCLOPramide (REGLAN) tablet 5 mg (5 mg Oral Given 07/02/17 1305)  diphenhydrAMINE (BENADRYL) capsule 25 mg (25 mg Oral Given 07/02/17 1304)  acetaminophen (TYLENOL) tablet 1,000 mg (1,000 mg Oral Given 07/02/17 1304)     Initial Impression / Assessment and Plan / ED Course  I have reviewed the triage vital signs and the nursing notes.  Pertinent labs & imaging results that were available during my care of the patient were reviewed by me and considered in my medical decision making (see chart for details).    Labs:   Imaging:  Consults:  Therapeutics: Tylenol, Reglan, Benadryl (Zofran prior to evaluation)  Discharge Meds:   Assessment/Plan: [age] year old female presents today with complaints of migraine.  Patient has a history of the same, she has no red flags.  Patient had symptomatic improvement here, she is encouraged to use Tylenol at home, B6 as needed for nausea, outpatient follow-up with OB/GYN, strict return precautions given.  She verbalized understanding and agreement to today's plan had no further questions or concerns.    Final Clinical Impressions(s) / ED Diagnoses   Final diagnoses:  Migraine without status migrainosus, not  intractable, unspecified migraine type    ED Discharge Orders        Ordered    vitamin B-6 (PYRIDOXINE) 25 MG tablet  Every 8 hours PRN     07/02/17 1440       Eyvonne MechanicHedges, Saidy Ormand, PA-C 07/02/17 1450    Benjiman CorePickering, Nathan, MD 07/02/17 2220

## 2017-07-02 NOTE — ED Triage Notes (Signed)
Pt arrives ambulatory  via ems for migraine x 3 days pt is 7 week preg, pt has been vomiting  But not sure if because of migrain or preg

## 2017-07-02 NOTE — ED Notes (Signed)
Got patient hooked up to the monitor got a warm blanket patient is resting with call bell in reach

## 2017-07-26 ENCOUNTER — Encounter: Payer: Self-pay | Admitting: Family

## 2017-07-26 ENCOUNTER — Encounter: Payer: Self-pay | Admitting: General Practice

## 2017-08-29 ENCOUNTER — Encounter: Payer: Self-pay | Admitting: General Practice

## 2017-08-29 ENCOUNTER — Other Ambulatory Visit (HOSPITAL_COMMUNITY)
Admission: RE | Admit: 2017-08-29 | Discharge: 2017-08-29 | Disposition: A | Discharge status: Home / Self Care | Payer: Medicaid Other | Source: Ambulatory Visit | Attending: Advanced Practice Midwife | Admitting: Advanced Practice Midwife

## 2017-08-29 ENCOUNTER — Encounter: Payer: Self-pay | Admitting: Advanced Practice Midwife

## 2017-08-29 ENCOUNTER — Other Ambulatory Visit: Payer: Self-pay

## 2017-08-29 ENCOUNTER — Ambulatory Visit (INDEPENDENT_AMBULATORY_CARE_PROVIDER_SITE_OTHER): Payer: Medicaid Other | Admitting: Advanced Practice Midwife

## 2017-08-29 DIAGNOSIS — O98512 Other viral diseases complicating pregnancy, second trimester: Secondary | ICD-10-CM | POA: Diagnosis not present

## 2017-08-29 DIAGNOSIS — Z3482 Encounter for supervision of other normal pregnancy, second trimester: Secondary | ICD-10-CM | POA: Diagnosis not present

## 2017-08-29 DIAGNOSIS — Z3480 Encounter for supervision of other normal pregnancy, unspecified trimester: Secondary | ICD-10-CM | POA: Diagnosis not present

## 2017-08-29 DIAGNOSIS — B009 Herpesviral infection, unspecified: Secondary | ICD-10-CM | POA: Insufficient documentation

## 2017-08-29 DIAGNOSIS — O98519 Other viral diseases complicating pregnancy, unspecified trimester: Secondary | ICD-10-CM

## 2017-08-29 MED ORDER — VALACYCLOVIR HCL 1 G PO TABS: 1000.0000 mg | ORAL_TABLET | Freq: Every day | ORAL | 11 refills | Status: DC

## 2017-08-29 MED ORDER — ALBUTEROL SULFATE HFA 108 (90 BASE) MCG/ACT IN AERS: 1.0000 | INHALATION_SPRAY | Freq: Four times a day (QID) | RESPIRATORY_TRACT | 0 refills | Status: DC | PRN

## 2017-08-29 MED ORDER — ALBUTEROL SULFATE HFA 108 (90 BASE) MCG/ACT IN AERS: 1.0000 | INHALATION_SPRAY | RESPIRATORY_TRACT | 2 refills | Status: DC | PRN

## 2017-08-29 MED ORDER — VALACYCLOVIR HCL 1 G PO TABS: 1000.0000 mg | ORAL_TABLET | Freq: Two times a day (BID) | ORAL | 3 refills | Status: DC | PRN

## 2017-08-29 NOTE — Progress Notes (Signed)
Subjective:   Jaclyn Owens is a 22 y.o. G2P1001 at [redacted]w[redacted]d by LMP, early ultrasound being seen today for her first obstetrical visit.  Her obstetrical history is significant for intrauterine growth restriction (IUGR). Patient does intend to breast feed. Pregnancy history fully reviewed.  Patient reports no complaints.  HISTORY: OB History  Gravida Para Term Preterm AB Living  2 1 1  0 0 1  SAB TAB Ectopic Multiple Live Births  0 0 0 0 0    # Outcome Date GA Lbr Len/2nd Weight Sex Delivery Anes PTL Lv  2 Current           1 Term      Vag-Spont       Last pap smear was done never and was NA  Past Medical History:  Diagnosis Date  . Asthma    Past Surgical History:  Procedure Laterality Date  . ARM WOUND REPAIR / CLOSURE     Family History  Problem Relation Age of Onset  . Asthma Mother   . Diabetes Maternal Aunt   . Cancer Maternal Grandmother    Social History   Tobacco Use  . Smoking status: Former Smoker    Types: Cigarettes, Cigars  . Smokeless tobacco: Never Used  Substance Use Topics  . Alcohol use: Not Currently  . 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 Bee Venom Anaphylaxis   No current outpatient medications on file prior to visit.   No current facility-administered medications on file prior to visit.     Review of Systems Pertinent items noted in HPI and remainder of comprehensive ROS otherwise negative.  Exam   Vitals:   08/29/17 1344  BP: 106/71  Pulse: 100  Weight: 140 lb (63.5 kg)   Fetal Heart Rate (bpm): 156  Uterus:     Pelvic Exam: Perineum: no hemorrhoids, normal perineum   Vulva: normal external genitalia, no lesions   Vagina:  normal mucosa, normal discharge   Cervix: no lesions and normal, pap smear done.    Adnexa: normal adnexa and no mass, fullness, tenderness   Bony Pelvis: average  System: General: well-developed, well-nourished female in no acute distress   Breast:  normal appearance, no masses or tenderness   Skin: normal coloration and turgor, no rashes   Neurologic: oriented, normal, negative, normal mood   Extremities: normal strength, tone, and muscle mass, ROM of all joints is normal   HEENT PERRLA, extraocular movement intact and sclera clear, anicteric   Mouth/Teeth mucous membranes moist, pharynx normal without lesions and dental hygiene good   Neck supple and no masses   Cardiovascular: regular rate and rhythm   Respiratory:  no respiratory distress, normal breath sounds   Abdomen: soft, non-tender; bowel sounds normal; no masses,  no organomegaly    Assessment:   Pregnancy: G2P1001 Patient Active Problem List   Diagnosis Date Noted  . Supervision of other normal pregnancy, antepartum 08/29/2017  . 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 Adjustment disorder with depressed mood 02/24/2017     Plan:  1. Supervision of other normal pregnancy, antepartum - Routine care - Obstetric Panel, Including HIV - Cytology - PAP - Cystic Fibrosis Mutation 97 - Genetic Screening - SMN1 COPY NUMBER ANALYSIS (SMA Carrier Screen) - Hemoglobinopathy Evaluation - Culture, OB Urine - AFP, Serum, Open Spina Bifida - [place] MFM OB COMP + 14 WK; Future - HgB A1c  2. History  of HSV, diagnosed in 06/2016 - frequent outbreaks (1-2 per month) - will start daily suppression now instead of waiting due to frequent outbreaks.    Initial labs drawn. Continue prenatal vitamins. Genetic Screening discussed, AFP and NIPS: ordered. Ultrasound discussed; fetal anatomic survey: ordered. Problem list reviewed and updated. The nature [facility] with multiple MDs and other Advanced Practice Providers was explained to patient; also emphasized that residents, students are part of our team. Routine obstetric precautions reviewed. 50% of 45 min visit spent in counseling and coordination of care. Return in  about 1 month (around 09/26/2017).

## 2017-08-29 NOTE — Assessment & Plan Note (Signed)
  Nursing Staff Provider  Office Location  [place] Dating  LMP 05/08/17  Language  English Anatomy US    Flu Vaccine   Genetic Screen  NIPS:   AFP:   First Screen:  Quad:    TDaP vaccine    Hgb A1C or  GTT Early  Third trimester   Rhogam     LAB RESULTS   Feeding Plan Breast Blood Type     Contraception Nexplanon/Depo Antibody    Circumcision Yes Rubella    P[facility]ian  [facility]     Support Person FOB Jaclyn Owens HBsAg     Prenatal Classes No HIV    BTL Consent  GBS  (For PCN allergy, check sensitivities)   VBAC Consent  Pap     Hgb Electro      CF     SMA     Waterbirth  [ ]  Class [ ]  Consent [ ]  CNM visit

## 2017-08-29 NOTE — Patient Instructions (Signed)
Safe Medications in Pregnancy   Acne: Benzoyl Peroxide Salicylic Acid  Backache/Headache: Tylenol: 2 regular strength every 4 hours OR              2 Extra strength every 6 hours  Colds/Coughs/Allergies: Benadryl (alcohol free) 25 mg every 6 hours as needed Breath right strips Claritin Cepacol throat lozenges Chloraseptic throat spray Cold-Eeze- up to three times per day Cough drops, alcohol free Flonase (by prescription only) Guaifenesin Mucinex Robitussin DM (plain only, alcohol free) Saline nasal spray/drops Sudafed (pseudoephedrine) & Actifed ** use only after [redacted] weeks gestation and if you do not have high blood pressure Tylenol Vicks Vaporub Zinc lozenges Zyrtec   Constipation: Colace Ducolax suppositories Fleet enema Glycerin suppositories Metamucil Milk of magnesia Miralax Senokot Smooth move tea  Diarrhea: Kaopectate Imodium A-D  *NO pepto Bismol  Hemorrhoids: Anusol Anusol HC Preparation H Tucks  Indigestion: Tums Maalox Mylanta Zantac  Pepcid  Insomnia: Benadryl (alcohol free) 25mg every 6 hours as needed Tylenol PM Unisom, no Gelcaps  Leg Cramps: Tums MagGel  Nausea/Vomiting:  Bonine Dramamine Emetrol Ginger extract Sea bands Meclizine  Nausea medication to take during pregnancy:  Unisom (doxylamine succinate 25 mg tablets) Take one tablet daily at bedtime. If symptoms are not adequately controlled, the dose can be increased to a maximum recommended dose of two tablets daily (1/2 tablet in the morning, 1/2 tablet mid-afternoon and one at bedtime). Vitamin B6 100mg tablets. Take one tablet twice a day (up to 200 mg per day).  Skin Rashes: Aveeno products Benadryl cream or 25mg every 6 hours as needed Calamine Lotion 1% cortisone cream  Yeast infection: Gyne-lotrimin 7 Monistat 7   **If taking multiple medications, please check labels to avoid duplicating the same active ingredients **take medication as directed on  the label ** Do not exceed 4000 mg of tylenol in 24 hours **Do not take medications that contain aspirin or ibuprofen   Childbirth Education Options: [place] Classes:  Childbirth education classes can help you get ready for a positive parenting experience. You can also meet other expectant parents and get free stuff for your baby. Each class runs for five weeks on the same night and costs $45 for the mother-to-be and her support person. Medicaid covers the cost if you are eligible. Call [phone] to register. [facility] Childbirth Education:  [phone] or [phone] or [email or web address]  Baby & Me Class: Discuss newborn & infant parenting and family adjustment issues with other new mothers in a relaxed environment. Each week brings a new speaker or baby-centered activity. We encourage new mothers to join us every Thursday at 11:00am. Babies birth until crawling. No registration or fee. Daddy Boot Camp: This course offers Dads-to-be the tools and knowledge needed to feel confident on their journey to becoming new fathers. Experienced dads, who have been trained as coaches, teach dads-to-be how to hold, comfort, diaper, swaddle and play with their infant while being able to support the new mom as well. A class for men taught by men. $25/dad Big Brother/Big Sister: Let your children share in the joy of a new brother or sister in this special class designed just for them. Class includes discussion about how families care for babies: swaddling, holding, diapering, safety as well as how they can be helpful in their new role. This class is designed for children ages 2 to 6, but any age is welcome. Please register each child individually. $5/child  Mom Talk: This mom-led group offers support and connection to   mothers as they journey through the adjustments and struggles of that sometimes overwhelming first year after the birth of a child. Tuesdays at 10:00am and  Thursdays at 6:00pm. Babies welcome. No registration or fee. Breastfeeding Support Group: This group is a mother-to-mother support circle where moms have the opportunity to share their breastfeeding experiences. A Lactation Consultant is present for questions and concerns. Meets each Tuesday at 11:00am. No fee or registration. Breastfeeding Your Baby: Learn what to expect in the first days of breastfeeding your newborn.  This class will help you feel more confident with the skills needed to begin your breastfeeding experience. Many new mothers are concerned about breastfeeding after leaving the hospital. This class will also address the most common fears and challenges about breastfeeding during the first few weeks, months and beyond. (call for fee) Comfort Techniques and Tour: This 2 hour interactive class will provide you the opportunity to learn & practice hands-on techniques that can help relieve some of the discomfort of labor and encourage your baby to rotate toward the best position for birth. You and your partner will be able to try a variety of labor positions with birth balls and rebozos as well as practice breathing, relaxation, and visualization techniques. A tour of the [facility] is included with this class. $20 per registrant and support person Childbirth Class- Weekend Option: This class is a Weekend version of our Birth & Baby series. It is designed for parents who have a difficult time fitting several weeks of classes into their schedule. It covers the care of your newborn and the basics of labor and childbirth. It also includes a Maternity Care Center Tour of [facility] and lunch. The class is held two consecutive days: beginning on Friday evening from 6:30 - 8:30 p.m. and the next day, Saturday from 9 a.m. - 4 p.m. (call for fee) Waterbirth Class: Interested in a waterbirth?  This informational class will help you discover whether waterbirth is the right fit  for you. Education about waterbirth itself, supplies you would need and how to assemble your support team is what you can expect from this class. Some obstetrical practices require this class in order to pursue a waterbirth. (Not all obstetrical practices offer waterbirth-check with your healthcare provider.) Register only the expectant mom, but you are encouraged to bring your partner to class! Required if planning waterbirth, no fee. Infant/Child CPR: Parents, grandparents, babysitters, and friends learn Cardio-Pulmonary Resuscitation skills for infants and children. You will also learn how to treat both conscious and unconscious choking in infants and children. This Family & Friends program does not offer certification. Register each participant individually to ensure that enough mannequins are available. (Call for fee) Grandparent Love: Expecting a grandbaby? This class is for you! Learn about the latest infant care and safety recommendations and ways to support your own child as he or she transitions into the parenting role. Taught by Registered Nurses who are childbirth instructors, but most importantly...they are grandmothers too! $10/person. Childbirth Class- Natural Childbirth: This series of 5 weekly classes is for expectant parents who want to learn and practice natural methods of coping with the process of labor and childbirth. Relaxation, breathing, massage, visualization, role of the partner, and helpful positioning are highlighted. Participants learn how to be confident in their body's ability to give birth. This class will empower and help parents make informed decisions about their own care. Includes discussion that will help new parents transition into the immediate postpartum period. Maternity   Care Center Tour of [facility] is included. We suggest taking this class between 25-32 weeks, but it's only a recommendation. $75 per registrant and one support person or $30 Medicaid. Childbirth  Class- 3 week Series: This option of 3 weekly classes helps you and your labor partner prepare for childbirth. Newborn care, labor & birth, cesarean birth, pain management, and comfort techniques are discussed and a Maternity Care Center Tour of [facility] is included. The class meets at the same time, on the same day of the week for 3 consecutive weeks beginning with the starting date you choose. $60 for registrant and one support person.  Marvelous Multiples: Expecting twins, triplets, or more? This class covers the differences in labor, birth, parenting, and breastfeeding issues that face multiples' parents. NICU tour is included. Led by a Certified Childbirth Educator who is the mother of twins. No fee. Caring for Baby: This class is for expectant and adoptive parents who want to learn and practice the most up-to-date newborn care for their babies. Focus is on birth through the first six weeks of life. Topics include feeding, bathing, diapering, crying, umbilical cord care, circumcision care and safe sleep. Parents learn to recognize symptoms of illness and when to call the pediatrician. Register only the mom-to-be and your partner or support person can plan to come with you! $10 per registrant and support person Childbirth Class- online option: This online class offers you the freedom to complete a Birth and Baby series in the comfort of your own home. The flexibility of this option allows you to review sections at your own pace, at times convenient to you and your support people. It includes additional video information, animations, quizzes, and extended activities. Get organized with helpful eClass tools, checklists, and trackers. Once you register online for the class, you will receive an email within a few days to accept the invitation and begin the class when the time is right for you. The content will be available to you for 60 days. $60 for 60 days of online access for you and your support  people.  Local Doulas: Natural Baby Doulas [email or web address] Tel: [phone] [email or web address] [place] [facility] [phone] [email or web address] The Labor Ladies  (also do waterbirth tub rental) [phone] [email or web address] [facility] 336-312-4678 [email or web address] Sacred [employer]hyth[employer]  [phone] [email or web address] [place] [employer] (PADA) [email or web address] http://[email or web address].org/index.htm [place] Birth and Baby  [email or web address] Considering Waterbirth? Guide for patients at [facility]  Why consider waterbirth?  . Gentle birth for babies . Less pain medicine used in labor . May allow for passive descent/less pushing . May reduce perineal tears  . More mobility and instinctive maternal position changes . Increased maternal relaxation . Reduced blood pressure in labor  Is waterbirth safe? What are the risks of infection, drowning or other complications?  . Infection: o Very low risk (3.7 % for tub vs 4.8% for bed) o 7 in 8000 waterbirths with documented infection o Poorly cleaned equipment most common cause o Slightly lower group B strep transmission rate  . Drowning o Maternal:  - Very low risk   - Related to seizures or fainting o Newborn:  - Very low risk. No evidence of increased risk of respiratory problems in multiple large studies - Physiological protection from breathing under water - Avoid underwater birth if there are any fetal complications - Once baby's head is out of the water, keep it out.  . Birth complication o Some reports of cord trauma, but risk decreased by   bringing baby to surface gradually o No evidence of increased risk of shoulder dystocia. Mothers can usually change positions faster in water than in a bed, possibly aiding the  maneuvers to free the shoulder.   You must attend a Waterbirth class at [facility]  3rd Wednesday of every month from 7-9pm  Free  Register by calling [phone] or online at [email or web address]  Bring us the certificate from the class to your prenatal appointment  Meet with a midwife at 36 weeks to see if you can still plan a waterbirth and to sign the consent.   Purchase or rent the following supplies:   Water Birth Pool (Birth Pool in a Box or [place] for instance)  (Tubs start ~$125)  Single-use disposable tub liner designed for your brand of tub  New garden hose labeled "lead-free", "suitable for drinking water",  Electric drain pump to remove water (We recommend 792 gallon per hour or greater pump.)   Separate garden hose to remove the dirty water  Fish net  Bathing suit top (optional)  Long-handled mirror (optional)  Places to purchase or rent supplies  Yourwaterbirth.com for tub purchases and supplies  Waterbirthsolutions.com for tub purchases and supplies  The Labor Ladies (www.[email or web address]) $275 for tub rental/set-up & take down/kit   [place] [employer] (http://www.padanc.org/MeetUs.htm) Information regarding doulas (labor support) who provide pool rentals  Our practice has a Birth Pool in a Box tub at the hospital that you may borrow on a first-come-first-served basis. It is your responsibility to to set up, clean and break down the tub. We cannot guarantee the availability of this tub in advance. You are responsible for bringing all accessories listed above. If you do not have all necessary supplies you cannot have a waterbirth.    Things that would prevent you from having a waterbirth:  Premature, <37wks  Previous cesarean birth  Presence of thick meconium-stained fluid  Multiple gestation (Twins, triplets, etc.)  Uncontrolled diabetes or gestational diabetes requiring medication  Hypertension requiring medication  or diagnosis of pre-eclampsia  Heavy vaginal bleeding  Non-reassuring fetal heart rate  Active infection (MRSA, etc.). Group B Strep is NOT a contraindication for  waterbirth.  If your labor has to be induced and induction method requires continuous  monitoring of the baby's heart rate  Other risks/issues identified by your obstetrical provider  Please remember that birth is unpredictable. Under certain unforeseeable circumstances your provider may advise against giving birth in the tub. These decisions will be made on a case-by-case basis and with the safety of you and your baby as our highest priority.      

## 2017-08-30 LAB — CYTOLOGY - PAP
BACTERIAL VAGINITIS: POSITIVE — AB
CHLAMYDIA, DNA PROBE: NEGATIVE
Candida vaginitis: POSITIVE — AB
Diagnosis: NEGATIVE
Neisseria Gonorrhea: NEGATIVE
TRICH (WINDOWPATH): NEGATIVE

## 2017-08-30 MED ORDER — TERCONAZOLE 0.4 % VA CREA: 1.0000 | TOPICAL_CREAM | Freq: Every day | VAGINAL | 0 refills | Status: DC

## 2017-08-30 MED ORDER — METRONIDAZOLE 500 MG PO TABS: 500.0000 mg | ORAL_TABLET | Freq: Two times a day (BID) | ORAL | 0 refills | Status: DC

## 2017-08-30 NOTE — Addendum Note (Signed)
Addended by: Thressa ShellerHOGAN, Doniesha Landau D on: 08/30/2017 07:34 PM   Modules accepted: Orders

## 2017-08-31 LAB — CULTURE, OB URINE

## 2017-08-31 LAB — URINE CULTURE, OB REFLEX

## 2017-09-02 ENCOUNTER — Encounter: Payer: Self-pay | Admitting: *Deleted

## 2017-09-02 ENCOUNTER — Telehealth: Payer: Self-pay | Admitting: *Deleted

## 2017-09-02 DIAGNOSIS — Z3480 Encounter for supervision of other normal pregnancy, unspecified trimester: Secondary | ICD-10-CM

## 2017-09-02 MED ORDER — METRONIDAZOLE 500 MG PO TABS
500.0000 mg | ORAL_TABLET | Freq: Two times a day (BID) | ORAL | 0 refills | Status: DC
Start: 2017-09-02 — End: 2017-09-26

## 2017-09-02 MED ORDER — TERCONAZOLE 0.4 % VA CREA: 1.0000 | TOPICAL_CREAM | Freq: Every day | VAGINAL | 0 refills | Status: AC

## 2017-09-02 NOTE — Telephone Encounter (Signed)
Patient called regarding positive BV and yeast. Advised patient that medication was sent to pharmacy and to follow up as needed. Pt requested that [facility] [address].  Clovis PuMartin, Ousman Dise L, RN

## 2017-09-06 LAB — SMN1 COPY NUMBER ANALYSIS (SMA CARRIER SCREENING)

## 2017-09-06 LAB — AFP, SERUM, OPEN SPINA BIFIDA
AFP MoM: 0.74
AFP VALUE AFPOSL: 27.9 ng/mL
GEST. AGE ON COLLECTION DATE: 16.1 wk
Maternal Age At EDD: 21.6 yr
OSBR Risk 1 IN: 10000
Test Results:: NEGATIVE
WEIGHT: 140 [lb_av]

## 2017-09-06 LAB — HEMOGLOBIN A1C
ESTIMATED AVERAGE GLUCOSE: 97 mg/dL
HEMOGLOBIN A1C: 5 % (ref 4.8–5.6)

## 2017-09-07 LAB — OBSTETRIC PANEL, INCLUDING HIV
Antibody Screen: NEGATIVE
BASOS ABS: 0 10*3/uL (ref 0.0–0.2)
Basos: 1 %
EOS (ABSOLUTE): 0.2 10*3/uL (ref 0.0–0.4)
Eos: 2 %
HIV SCREEN 4TH GENERATION: NONREACTIVE
Hematocrit: 36.1 % (ref 34.0–46.6)
Hemoglobin: 11.9 g/dL (ref 11.1–15.9)
Hepatitis B Surface Ag: NEGATIVE
Immature Grans (Abs): 0.1 10*3/uL (ref 0.0–0.1)
Immature Granulocytes: 1 %
LYMPHS ABS: 1.8 10*3/uL (ref 0.7–3.1)
Lymphs: 21 %
MCH: 29.1 pg (ref 26.6–33.0)
MCHC: 33 g/dL (ref 31.5–35.7)
MCV: 88 fL (ref 79–97)
Monocytes Absolute: 0.5 10*3/uL (ref 0.1–0.9)
Monocytes: 5 %
NEUTROS ABS: 6.1 10*3/uL (ref 1.4–7.0)
Neutrophils: 70 %
PLATELETS: 237 10*3/uL (ref 150–450)
RBC: 4.09 x10E6/uL (ref 3.77–5.28)
RDW: 13 % (ref 12.3–15.4)
RPR Ser Ql: NONREACTIVE
Rh Factor: POSITIVE
Rubella Antibodies, IGG: 12.3 index (ref >0.99)
WBC: 8.6 10*3/uL (ref 3.4–10.8)

## 2017-09-07 LAB — HEMOGLOBINOPATHY EVALUATION
Ferritin: 45 ng/mL (ref 15–150)
HGB A: 97.5 % (ref 96.4–98.8)
HGB C: 0 %
HGB S: 0 %
HGB SOLUBILITY: NEGATIVE
Hgb A2 Quant: 2.5 % (ref 1.8–3.2)
Hgb F Quant: 0 % (ref 0.0–2.0)
Hgb Variant: 0 %

## 2017-09-07 LAB — CYSTIC FIBROSIS MUTATION 97: Interpretation: NOT DETECTED

## 2017-09-09 ENCOUNTER — Encounter: Payer: Self-pay | Admitting: General Practice

## 2017-09-11 ENCOUNTER — Encounter (HOSPITAL_COMMUNITY): Payer: Self-pay

## 2017-09-17 ENCOUNTER — Ambulatory Visit (HOSPITAL_COMMUNITY)
Admission: RE | Admit: 2017-09-17 | Discharge: 2017-09-17 | Disposition: A | Discharge status: Home / Self Care | Payer: Medicaid Other | Source: Ambulatory Visit | Attending: Advanced Practice Midwife | Admitting: Advanced Practice Midwife

## 2017-09-17 ENCOUNTER — Encounter (HOSPITAL_COMMUNITY): Payer: Self-pay

## 2017-09-17 DIAGNOSIS — Z363 Encounter for antenatal screening for malformations: Secondary | ICD-10-CM | POA: Diagnosis not present

## 2017-09-17 DIAGNOSIS — Z3480 Encounter for supervision of other normal pregnancy, unspecified trimester: Secondary | ICD-10-CM

## 2017-09-17 DIAGNOSIS — Z3A18 18 weeks gestation of pregnancy: Secondary | ICD-10-CM | POA: Diagnosis not present

## 2017-09-17 DIAGNOSIS — Z3482 Encounter for supervision of other normal pregnancy, second trimester: Secondary | ICD-10-CM | POA: Insufficient documentation

## 2017-09-18 ENCOUNTER — Ambulatory Visit (HOSPITAL_COMMUNITY): Payer: Medicaid Other

## 2017-09-26 ENCOUNTER — Encounter: Payer: Self-pay | Admitting: General Practice

## 2017-09-26 ENCOUNTER — Ambulatory Visit (INDEPENDENT_AMBULATORY_CARE_PROVIDER_SITE_OTHER): Payer: Medicaid Other | Admitting: Obstetrics and Gynecology

## 2017-09-26 DIAGNOSIS — Z3482 Encounter for supervision of other normal pregnancy, second trimester: Secondary | ICD-10-CM

## 2017-09-26 DIAGNOSIS — Z3480 Encounter for supervision of other normal pregnancy, unspecified trimester: Secondary | ICD-10-CM

## 2017-09-26 NOTE — Progress Notes (Signed)
   PRENATAL VISIT NOTE  Subjective:  Jaclyn Owens is a 22 y.o. G2P1001 at 5638w1d being seen today for ongoing prenatal care.  She is currently monitored for the following issues for this low-risk pregnancy and has Adjustment disorder with depressed mood; Supervision of other normal pregnancy, antepartum; and HSV-2 infection complicating pregnancy on their problem list.  Patient reports no complaints.  Contractions: Not present. Vag. Bleeding: None.  Movement: Present. Denies leaking of fluid.   The following portions of the patient's history were reviewed and updated as appropriate: allergies, current medications, past family history, past medical history, past social history, past surgical history and problem list. Problem list updated.  Objective:   Vitals:   09/26/17 1441  BP: 110/69  Pulse: 91  Weight: 147 lb (66.7 kg)    Fetal Status: Fetal Heart Rate (bpm): 152 Fundal Height: 19 cm Movement: Present     General:  Alert, oriented and cooperative. Patient is in no acute distress.  Skin: Skin is warm and dry. No rash noted.   Cardiovascular: Normal heart rate noted  Respiratory: Normal respiratory effort, no problems with respiration noted  Abdomen: Soft, gravid, appropriate for gestational age.  Pain/Pressure: Absent     Pelvic: Cervical exam deferred        Extremities: Normal range of motion.  Edema: None  Mental Status: Normal mood and affect. Normal behavior. Normal judgment and thought content.   Assessment and Plan:  Pregnancy: G2P1001 at 7538w1d  1. Supervision of other normal pregnancy, antepartum - Advised to get Good Rx app on phone to look up Rx's at a discounted price - Discussed importance of taking Flagyl to tx BV  Preterm labor symptoms and general obstetric precautions including but not limited to vaginal bleeding, contractions, leaking of fluid and fetal movement were reviewed in detail with the patient. Please refer to After Visit Summary for other  counseling recommendations.  Return in about 4 weeks (around 10/24/2017) for Return OB visit.  Future Appointments  Date Time Provider Department Center  10/24/2017  2:30 PM Raelyn MoraDawson, Murielle Stang, CNM [facility] None  11/21/2017  [facility] None  11/21/2017  9:10 AM Raelyn Moraawson, Robecca Fulgham, CNM [facility] None    Raelyn Moraolitta Dammon Makarewicz, [place]

## 2017-09-26 NOTE — Progress Notes (Signed)
Patient reports she was not able to purchase Flagyl due to insurance not paying - grandmother did purchase Valtrex for her.

## 2017-10-24 ENCOUNTER — Encounter: Payer: Self-pay | Admitting: Obstetrics and Gynecology

## 2017-10-24 ENCOUNTER — Other Ambulatory Visit: Payer: Self-pay

## 2017-10-24 ENCOUNTER — Ambulatory Visit (INDEPENDENT_AMBULATORY_CARE_PROVIDER_SITE_OTHER): Payer: Medicaid Other | Admitting: Obstetrics and Gynecology

## 2017-10-24 VITALS — BP 109/65 | HR 93 | Wt 151.4 lb

## 2017-10-24 DIAGNOSIS — Z3480 Encounter for supervision of other normal pregnancy, unspecified trimester: Secondary | ICD-10-CM

## 2017-10-24 NOTE — Progress Notes (Signed)
   PRENATAL VISIT NOTE  Subjective:  Jaclyn Owens is a 22 y.o. G2P1001 at [redacted]w[redacted]d being seen today for ongoing prenatal care.  She is currently monitored for the following issues for this low-risk pregnancy and has Adjustment disorder with depressed mood; Supervision of other normal pregnancy, antepartum; and HSV-2 infection complicating pregnancy on their problem list.  Patient reports no complaints.  Contractions: Not present. Vag. Bleeding: None.  Movement: Present. Denies leaking of fluid.   The following portions of the patient's history were reviewed and updated as appropriate: allergies, current medications, past family history, past medical history, past social history, past surgical history and problem list. Problem list updated.  Objective:   Vitals:   10/24/17 1403  BP: 109/65  Pulse: 93  Weight: 151 lb 6.4 oz (68.7 kg)    Fetal Status: Fetal Heart Rate (bpm): 152 Fundal Height: 24 cm Movement: Present     General:  Alert, oriented and cooperative. Patient is in no acute distress.  Skin: Skin is warm and dry. No rash noted.   Cardiovascular: Normal heart rate noted  Respiratory: Normal respiratory effort, no problems with respiration noted  Abdomen: Soft, gravid, appropriate for gestational age.  Pain/Pressure: Present     Pelvic: Cervical exam deferred        Extremities: Normal range of motion.  Edema: None  Mental Status: Normal mood and affect. Normal behavior. Normal judgment and thought content.   Assessment and Plan:  Pregnancy: G2P1001 at [redacted]w[redacted]d  Supervision of other normal pregnancy, antepartum - Anticipatory guidance for 2 hr GTT  nv  Preterm labor symptoms and general obstetric precautions including but not limited to vaginal bleeding, contractions, leaking of fluid and fetal movement were reviewed in detail with the patient. Please refer to After Visit Summary for other counseling recommendations.  Return in about 4 weeks (around 11/21/2017) for Return OB  2hr GTT.  Future Appointments  Date Time Provider Department Center  10/24/2017  2:30 PM Raelyn Mora, CNM [facility] None  11/21/2017  8:30 AM [facility] None  11/21/2017  9:10 AM Raelyn Mora, CNM [facility] None    Raelyn Mora, [place]

## 2017-10-24 NOTE — Patient Instructions (Signed)

## 2017-11-21 ENCOUNTER — Other Ambulatory Visit: Payer: Medicaid Other | Admitting: *Deleted

## 2017-11-21 ENCOUNTER — Other Ambulatory Visit: Payer: Self-pay

## 2017-11-21 ENCOUNTER — Ambulatory Visit (HOSPITAL_COMMUNITY): Payer: Medicaid Other | Attending: Obstetrics and Gynecology

## 2017-11-21 ENCOUNTER — Ambulatory Visit (INDEPENDENT_AMBULATORY_CARE_PROVIDER_SITE_OTHER): Payer: Medicaid Other | Admitting: Obstetrics and Gynecology

## 2017-11-21 DIAGNOSIS — Z3483 Encounter for supervision of other normal pregnancy, third trimester: Secondary | ICD-10-CM | POA: Diagnosis not present

## 2017-11-21 DIAGNOSIS — Z3480 Encounter for supervision of other normal pregnancy, unspecified trimester: Secondary | ICD-10-CM

## 2017-11-21 DIAGNOSIS — M79661 Pain in right lower leg: Secondary | ICD-10-CM | POA: Insufficient documentation

## 2017-11-21 DIAGNOSIS — M79662 Pain in left lower leg: Secondary | ICD-10-CM

## 2017-11-21 MED ORDER — MAGNESIUM OXIDE -MG SUPPLEMENT 250 MG PO TABS: 1.0000 | ORAL_TABLET | Freq: Every day | ORAL | 0 refills | Status: DC

## 2017-11-21 NOTE — Patient Instructions (Signed)

## 2017-11-21 NOTE — Progress Notes (Signed)
   PRENATAL VISIT NOTE  Subjective:  Jaclyn Owens is a 22 y.o. G2P1001 at [redacted]w[redacted]d being seen today for ongoing prenatal care.  She is currently monitored for the following issues for this low-risk pregnancy and has Adjustment disorder with depressed mood; Supervision of other normal pregnancy, antepartum; HSV-2 infection complicating pregnancy; and Bilateral calf pain on their problem list.  Patient reports lower LT groin "feels swollen, pressure and pain. Feeling pain in both calves while sleeping, if on her feet for too long. No pain in feet.".  Contractions: Not present. Vag. Bleeding: None.  Movement: Present. Denies leaking of fluid.   The following portions of the patient's history were reviewed and updated as appropriate: allergies, current medications, past family history, past medical history, past social history, past surgical history and problem list. Problem list updated.  Objective:   Vitals:   11/21/17 0826  BP: 105/73  Pulse: 94  Weight: 158 lb (71.7 kg)    Fetal Status: Fetal Heart Rate (bpm): 144 Fundal Height: 28 cm Movement: Present     General:  Alert, oriented and cooperative. Patient is in no acute distress.  Skin: Skin is warm and dry. No rash noted.   Cardiovascular: Normal heart rate noted  Respiratory: Normal respiratory effort, no problems with respiration noted  Abdomen: Soft, gravid, appropriate for gestational age.  Pain/Pressure: Present     Pelvic: Cervical exam deferred        Extremities: Normal range of motion.  Edema: None  Mental Status: Normal mood and affect. Normal behavior. Normal judgment and thought content.   Assessment and Plan:  Pregnancy: G2P1001 at [redacted]w[redacted]d  1. Supervision of other normal pregnancy, antepartum - OB appts every 2 wks from today  2. Bilateral calf pain - Venous Doppler of BLE -- scheduled for later today // pt declined to go today d/t childcare issues; pt will call the dept to reschedule - Discussed possible low  magnesium levels causing pain in calves - Rx for Magnesium Oxide 250 mg daily hs  Preterm labor symptoms and general obstetric precautions including but not limited to vaginal bleeding, contractions, leaking of fluid and fetal movement were reviewed in detail with the patient. Please refer to After Visit Summary for other counseling recommendations.  Return in about 2 weeks (around 12/05/2017) for Return OB visit.  Future Appointments  Date Time Provider Department Center  11/21/2017  2:00 PM [facility]  12/06/2017 10:50 AM Raelyn Mora, CNM [facility] None  12/19/2017  1:50 PM Raelyn Mora, CNM [facility] None  01/02/2018 10:10 AM Raelyn Mora, CNM [facility] None  01/16/2018 10:10 AM Raelyn Mora, CNM [facility] None  01/23/2018 10:10 AM Raelyn Mora, CNM [facility] None    Raelyn Mora, CNM

## 2017-11-21 NOTE — Progress Notes (Signed)
   Patient in clinic for 28 week lab work. Will call with results.  Clovis Pu, RN

## 2017-11-22 ENCOUNTER — Encounter: Payer: Self-pay | Admitting: General Practice

## 2017-11-22 LAB — CBC
HEMOGLOBIN: 11.2 g/dL (ref 11.1–15.9)
Hematocrit: 33.6 % — ABNORMAL LOW (ref 34.0–46.6)
MCH: 29.2 pg (ref 26.6–33.0)
MCHC: 33.3 g/dL (ref 31.5–35.7)
MCV: 88 fL (ref 79–97)
Platelets: 207 10*3/uL (ref 150–450)
RBC: 3.83 x10E6/uL (ref 3.77–5.28)
RDW: 13 % (ref 12.3–15.4)
WBC: 7.5 10*3/uL (ref 3.4–10.8)

## 2017-11-22 LAB — GLUCOSE TOLERANCE, 2 HOURS W/ 1HR
Glucose, 1 hour: 106 mg/dL (ref 65–179)
Glucose, 2 hour: 75 mg/dL (ref 65–152)
Glucose, Fasting: 81 mg/dL (ref 65–91)

## 2017-11-22 LAB — HIV ANTIBODY (ROUTINE TESTING W REFLEX): HIV Screen 4th Generation wRfx: NONREACTIVE

## 2017-11-22 LAB — RPR: RPR Ser Ql: NONREACTIVE

## 2017-11-24 LAB — URINE CULTURE, OB REFLEX

## 2017-11-24 LAB — CULTURE, OB URINE

## 2017-11-30 ENCOUNTER — Inpatient Hospital Stay (HOSPITAL_COMMUNITY)
Admission: AD | Admit: 2017-11-30 | Discharge: 2017-11-30 | Disposition: A | Discharge status: Home / Self Care | Payer: Medicaid Other | Attending: Obstetrics and Gynecology | Admitting: Obstetrics and Gynecology

## 2017-11-30 ENCOUNTER — Encounter (HOSPITAL_COMMUNITY): Payer: Self-pay | Admitting: *Deleted

## 2017-11-30 DIAGNOSIS — O98813 Other maternal infectious and parasitic diseases complicating pregnancy, third trimester: Secondary | ICD-10-CM

## 2017-11-30 DIAGNOSIS — Z3A29 29 weeks gestation of pregnancy: Secondary | ICD-10-CM | POA: Diagnosis not present

## 2017-11-30 DIAGNOSIS — Z87891 Personal history of nicotine dependence: Secondary | ICD-10-CM | POA: Diagnosis not present

## 2017-11-30 DIAGNOSIS — O0990 Supervision of high risk pregnancy, unspecified, unspecified trimester: Secondary | ICD-10-CM | POA: Diagnosis not present

## 2017-11-30 DIAGNOSIS — B379 Candidiasis, unspecified: Secondary | ICD-10-CM | POA: Diagnosis not present

## 2017-11-30 DIAGNOSIS — O200 Threatened abortion: Secondary | ICD-10-CM | POA: Diagnosis not present

## 2017-11-30 DIAGNOSIS — R58 Hemorrhage, not elsewhere classified: Secondary | ICD-10-CM | POA: Diagnosis not present

## 2017-11-30 DIAGNOSIS — B373 Candidiasis of vulva and vagina: Secondary | ICD-10-CM | POA: Diagnosis not present

## 2017-11-30 DIAGNOSIS — O26853 Spotting complicating pregnancy, third trimester: Secondary | ICD-10-CM | POA: Insufficient documentation

## 2017-11-30 LAB — URINALYSIS, ROUTINE W REFLEX MICROSCOPIC
Bilirubin Urine: NEGATIVE
Glucose, UA: NEGATIVE mg/dL
Hgb urine dipstick: NEGATIVE
Ketones, ur: NEGATIVE mg/dL
LEUKOCYTES UA: NEGATIVE
Nitrite: NEGATIVE
PROTEIN: NEGATIVE mg/dL
Specific Gravity, Urine: 1.016 (ref 1.005–1.030)
pH: 7 (ref 5.0–8.0)

## 2017-11-30 LAB — WET PREP, GENITAL
Clue Cells Wet Prep HPF POC: NONE SEEN
Sperm: NONE SEEN
Trich, Wet Prep: NONE SEEN

## 2017-11-30 MED ORDER — TERCONAZOLE 0.4 % VA CREA: 1.0000 | TOPICAL_CREAM | Freq: Every day | VAGINAL | 0 refills | Status: DC

## 2017-11-30 NOTE — Discharge Instructions (Signed)

## 2017-11-30 NOTE — MAU Note (Signed)
Went to [facility] about 2030 and saw pink on panties. No further bleeding that pt is aware of. Came by EMS. No pain. Good FM

## 2017-11-30 NOTE — Progress Notes (Signed)
Written and verbal d/c instructions given and understanding voiced. 

## 2017-11-30 NOTE — MAU Provider Note (Signed)
History     CSN: [id number]  Arrival date and time: 11/30/17 2118   First Provider Initiated Contact with Patient 11/30/17 2237      Chief Complaint  Patient presents with  . Vaginal Bleeding   HPI   Ms.Jaclyn Owens is a 22 y.o. female G2P1001 @ [redacted]w[redacted]d presenting to MAU with pink spotting that she noticed today. She noticed it on her underwear. The bleeding would not soak a pad, it is very light. No recent intercourse. No pain. 0/10. No vaginal discharge. 1 sexual partner.   OB History    Gravida  2   Para  1   Term  1   Preterm  0   AB  0   Living  1     SAB  0   TAB  0   Ectopic  0   Multiple  0   Live Births  0           Past Medical History:  Diagnosis Date  . Asthma     Past Surgical History:  Procedure Laterality Date  . ARM WOUND REPAIR / CLOSURE      Family History  Problem Relation Age of Onset  . Asthma Mother   . Diabetes Maternal Aunt   . Cancer Maternal Grandmother     Social History   Tobacco Use  . Smoking status: Former Smoker    Types: Cigarettes, Cigars  . Smokeless tobacco: Never Used  Substance Use Topics  . Alcohol use: Not Currently  . 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 Bee Venom Anaphylaxis    Medications Prior to Admission  Medication Sig Dispense Refill Last Dose  . albuterol (PROVENTIL HFA;VENTOLIN HFA) 108 (90 Base) MCG/ACT inhaler Inhale 1-2 puffs into the lungs every 4 (four) hours as needed for wheezing or shortness of breath. 1 Inhaler 2 Taking  . Magnesium Oxide -Mg Supplement 250 MG TABS Take 1 tablet (250 mg total) by mouth at bedtime. 30 tablet 0   . Prenatal Multivit-Min-Fe-FA (PRENATAL/IRON) TABS Take by mouth.   Taking  . valACYclovir (VALTREX) 1000 MG tablet Take 1 tablet (1,000 mg total) by mouth daily. 30 tablet 11 Taking   Results for orders placed or performed during the hospital encounter of 11/30/17 (from the  past 48 hour(s))  Wet prep, genital     Status: Abnormal   Collection Time: 11/30/17 10:48 PM  Result Value Ref Range   Yeast Wet Prep HPF POC PRESENT (A) NONE SEEN   Trich, Wet Prep NONE SEEN NONE SEEN   Clue Cells Wet Prep HPF POC NONE SEEN NONE SEEN   WBC, Wet Prep HPF POC MANY (A) NONE SEEN    Comment: FEW BACTERIA SEEN   Sperm NONE SEEN     Comment: Performed at [facility], [address] [place], [place] [address]  Urinalysis, Routine w reflex microscopic     Status: None   Collection Time: 11/30/17 11:02 PM  Result Value Ref Range   Color, Urine YELLOW YELLOW   APPearance CLEAR CLEAR   Specific Gravity, Urine 1.016 1.005 - 1.030   pH 7.0 5.0 - 8.0   Glucose, UA NEGATIVE NEGATIVE mg/dL   Hgb urine dipstick NEGATIVE NEGATIVE   Bilirubin Urine NEGATIVE NEGATIVE   Ketones, ur NEGATIVE NEGATIVE mg/dL   Protein, ur NEGATIVE NEGATIVE mg/dL   Nitrite NEGATIVE NEGATIVE   Leukocytes, UA  NEGATIVE NEGATIVE    Comment: Performed at [facility], [address] [place], [place] [address]    Review of Systems  Constitutional: Negative for fever.  Gastrointestinal: Negative for abdominal pain.  Genitourinary: Positive for vaginal bleeding and vaginal discharge.   Physical Exam   Blood pressure 106/69, pulse 98, temperature 98.1 F (36.7 C), resp. rate 18, height 5\' 6"  (1.676 m), weight 72.1 kg, last menstrual period 05/08/2017, unknown if currently breastfeeding.  Physical Exam  Constitutional: She is oriented to person, place, and time. She appears well-developed and well-nourished. No distress.  HENT:  Head: Normocephalic.  Eyes: Pupils are equal, round, and reactive to light.  GI: Soft. She exhibits no distension. There is no tenderness. There is no rebound and no guarding.  Genitourinary:  Genitourinary Comments: Vagina - Small amount of thick white vaginal discharge, no odor  Cervix - No contact bleeding, no active bleeding  Bimanual exam: Cervix closed,  anterior  GC/Chlam, wet prep done Chaperone present for exam.   Musculoskeletal: Normal range of motion.  Neurological: She is alert and oriented to person, place, and time.  Skin: Skin is warm. She is not diaphoretic.   Fetal Tracing: Baseline: 140 bpm Variability: Moderate  Accelerations: 10x10 Decelerations: None Toco: None  MAU Course  Procedures  None  MDM  Anterior placenta B positive blood type  Pelvic exam negative for bleeding. UA negative for Hgb.   Assessment and Plan   A:  1. Yeast infection   2. [redacted] weeks gestation of pregnancy     P:  Discharge home in stable condition Rx: Terazol Strict return precautions Return if bleeding returns  Venia Carbon I, NP 12/01/2017 12:03 AM

## 2017-12-02 LAB — GC/CHLAMYDIA PROBE AMP (~~LOC~~) NOT AT ARMC
CHLAMYDIA, DNA PROBE: NEGATIVE
Neisseria Gonorrhea: NEGATIVE

## 2017-12-06 ENCOUNTER — Ambulatory Visit (INDEPENDENT_AMBULATORY_CARE_PROVIDER_SITE_OTHER): Payer: Medicaid Other | Admitting: Obstetrics and Gynecology

## 2017-12-06 ENCOUNTER — Telehealth: Payer: Self-pay

## 2017-12-06 VITALS — BP 107/71 | HR 88 | Wt 159.4 lb

## 2017-12-06 DIAGNOSIS — Z3480 Encounter for supervision of other normal pregnancy, unspecified trimester: Secondary | ICD-10-CM

## 2017-12-06 DIAGNOSIS — Z23 Encounter for immunization: Secondary | ICD-10-CM | POA: Diagnosis not present

## 2017-12-06 DIAGNOSIS — Z3483 Encounter for supervision of other normal pregnancy, third trimester: Secondary | ICD-10-CM

## 2017-12-06 DIAGNOSIS — K219 Gastro-esophageal reflux disease without esophagitis: Secondary | ICD-10-CM

## 2017-12-06 MED ORDER — RANITIDINE HCL 150 MG PO TABS: 150.0000 mg | ORAL_TABLET | Freq: Two times a day (BID) | ORAL | 0 refills | Status: DC

## 2017-12-06 NOTE — Progress Notes (Signed)
   PRENATAL VISIT NOTE  Subjective:  Jaclyn Owens is a 21 y.o. G2P1001 at [redacted]w[redacted]d being seen today for ongoing prenatal care.  She is currently monitored for the following issues for this low-risk pregnancy and has Adjustment disorder with depressed mood; Supervision of other normal pregnancy, antepartum; HSV-2 infection complicating pregnancy; and Bilateral calf pain on their problem list.  Patient reports heartburn; requesting Rx. Still taking Flagyl.   Contractions: Not present. Vag. Bleeding: None.  Movement: Present. Denies leaking of fluid.   The following portions of the patient's history were reviewed and updated as appropriate: allergies, current medications, past family history, past medical history, past social history, past surgical history and problem list. Problem list updated.  Objective:   Vitals:   12/06/17 1028  BP: 107/71  Pulse: 88  Weight: 159 lb 6.4 oz (72.3 kg)    Fetal Status: Fetal Heart Rate (bpm): 137 Fundal Height: 28 cm Movement: Present     General:  Alert, oriented and cooperative. Patient is in no acute distress.  Skin: Skin is warm and dry. No rash noted.   Cardiovascular: Normal heart rate noted  Respiratory: Normal respiratory effort, no problems with respiration noted  Abdomen: Soft, gravid, appropriate for gestational age.  Pain/Pressure: Present     Pelvic: Cervical exam deferred        Extremities: Normal range of motion.  Edema: None  Mental Status: Normal mood and affect. Normal behavior. Normal judgment and thought content.   Assessment and Plan:  Pregnancy: G2P1001 at [redacted]w[redacted]d  1. Supervision of other normal pregnancy, antepartum - Tdap vaccine greater than or equal to 7yo IM - Plan growth U/S at 36 wks for h/o IUGR  2. Gastroesophageal reflux disease without esophagitis - Rx for Ranitidine (ZANTAC) 150 MG tablet; Take 1 tablet (150 mg total) by mouth 2 (two) times daily.  Dispense: 60 tablet; Refill: 0  Preterm labor symptoms and  general obstetric precautions including but not limited to vaginal bleeding, contractions, leaking of fluid and fetal movement were reviewed in detail with the patient. Please refer to After Visit Summary for other counseling recommendations.  Return in about 2 weeks (around 12/20/2017) for Return OB visit.  Future Appointments  Date Time Provider Department Center  12/19/2017  1:50 PM Raelyn Mora, CNM [facility] None  01/02/2018 10:10 AM Raelyn Mora, CNM [facility] None  01/16/2018 10:10 AM Raelyn Mora, CNM [facility] None  01/23/2018 10:10 AM Raelyn Mora, CNM [facility] None    Raelyn Mora, CNM

## 2017-12-06 NOTE — Patient Instructions (Signed)
Tdap Vaccine (Tetanus, Diphtheria and Pertussis): What You Need to Know 1. Why get vaccinated? Tetanus, diphtheria and pertussis are very serious diseases. Tdap vaccine can protect us from these diseases. And, Tdap vaccine given to pregnant women can protect newborn babies against pertussis. TETANUS (Lockjaw) is rare in the [place] today. It causes painful muscle tightening and stiffness, usually all over the body.  It can lead to tightening of muscles in the head and neck so you can't open your mouth, swallow, or sometimes even breathe. Tetanus kills about 1 out of 10 people who are infected even after receiving the best medical care.  DIPHTHERIA is also rare in the [place] today. It can cause a thick coating to form in the back of the throat.  It can lead to breathing problems, heart failure, paralysis, and death.  PERTUSSIS (Whooping Cough) causes severe coughing spells, which can cause difficulty breathing, vomiting and disturbed sleep.  It can also lead to weight loss, incontinence, and rib fractures. Up to 2 in 100 adolescents and 5 in 100 adults with pertussis are hospitalized or have complications, which could include pneumonia or death.  These diseases are caused by bacteria. Diphtheria and pertussis are spread from person to person through secretions from coughing or sneezing. Tetanus enters the body through cuts, scratches, or wounds. Before vaccines, as many as 200,000 cases of diphtheria, 200,000 cases of pertussis, and hundreds of cases of tetanus, were reported in the [place] each year. Since vaccination began, reports of cases for tetanus and diphtheria have dropped by about 99% and for pertussis by about 80%. 2. Tdap vaccine Tdap vaccine can protect adolescents and adults from tetanus, diphtheria, and pertussis. One dose of Tdap is routinely given at age 11 or 12. People who did not get Tdap at that age should get it as soon as possible. Tdap is especially  important for healthcare professionals and anyone having close contact with a baby younger than 12 months. Pregnant women should get a dose of Tdap during every pregnancy, to protect the newborn from pertussis. Infants are most at risk for severe, life-threatening complications from pertussis. Another vaccine, called Td, protects against tetanus and diphtheria, but not pertussis. A Td booster should be given every 10 years. Tdap may be given as one of these boosters if you have never gotten Tdap before. Tdap may also be given after a severe cut or burn to prevent tetanus infection. Your doctor or the person giving you the vaccine can give you more information. Tdap may safely be given at the same time as other vaccines. 3. Some people should not get this vaccine  A person who has ever had a life-threatening allergic reaction after a previous dose of any diphtheria, tetanus or pertussis containing vaccine, OR has a severe allergy to any part of this vaccine, should not get Tdap vaccine. Tell the person giving the vaccine about any severe allergies.  Anyone who had coma or long repeated seizures within 7 days after a childhood dose of DTP or DTaP, or a previous dose of Tdap, should not get Tdap, unless a cause other than the vaccine was found. They can still get Td.  Talk to your doctor if you: ? have seizures or another nervous system problem, ? had severe pain or swelling after any vaccine containing diphtheria, tetanus or pertussis, ? ever had a condition called Guillain-B[place] Syndrome (GBS), ? aren't feeling well on the day the shot is scheduled. 4. Risks With any medicine, including   vaccines, there is a chance of side effects. These are usually mild and go away on their own. Serious reactions are also possible but are rare. Most people who get Tdap vaccine do not have any problems with it. Mild problems following Tdap: (Did not interfere with activities)  Pain where the shot was given (about  3 in 4 adolescents or 2 in 3 adults)  Redness or swelling where the shot was given (about 1 person in 5)  Mild fever of at least 100.4F (up to about 1 in 25 adolescents or 1 in 100 adults)  Headache (about 3 or 4 people in 10)  Tiredness (about 1 person in 3 or 4)  Nausea, vomiting, diarrhea, stomach ache (up to 1 in 4 adolescents or 1 in 10 adults)  Chills, sore joints (about 1 person in 10)  Body aches (about 1 person in 3 or 4)  Rash, swollen glands (uncommon)  Moderate problems following Tdap: (Interfered with activities, but did not require medical attention)  Pain where the shot was given (up to 1 in 5 or 6)  Redness or swelling where the shot was given (up to about 1 in 16 adolescents or 1 in 12 adults)  Fever over 102F (about 1 in 100 adolescents or 1 in 250 adults)  Headache (about 1 in 7 adolescents or 1 in 10 adults)  Nausea, vomiting, diarrhea, stomach ache (up to 1 or 3 people in 100)  Swelling of the entire arm where the shot was given (up to about 1 in 500).  Severe problems following Tdap: (Unable to perform usual activities; required medical attention)  Swelling, severe pain, bleeding and redness in the arm where the shot was given (rare).  Problems that could happen after any vaccine:  People sometimes faint after a medical procedure, including vaccination. Sitting or lying down for about 15 minutes can help prevent fainting, and injuries caused by a fall. Tell your doctor if you feel dizzy, or have vision changes or ringing in the ears.  Some people get severe pain in the shoulder and have difficulty moving the arm where a shot was given. This happens very rarely.  Any medication can cause a severe allergic reaction. Such reactions from a vaccine are very rare, estimated at fewer than 1 in a million doses, and would happen within a few minutes to a few hours after the vaccination. As with any medicine, there is a very remote chance of a vaccine  causing a serious injury or death. The safety of vaccines is always being monitored. For more information, visit: www.cdc.gov/vaccinesafety/ 5. What if there is a serious problem? What should I look for? Look for anything that concerns you, such as signs of a severe allergic reaction, very high fever, or unusual behavior. Signs of a severe allergic reaction can include hives, swelling of the face and throat, difficulty breathing, a fast heartbeat, dizziness, and weakness. These would usually start a few minutes to a few hours after the vaccination. What should I do?  If you think it is a severe allergic reaction or other emergency that can't wait, call 9-1-1 or get the person to the nearest hospital. Otherwise, call your doctor.  Afterward, the reaction should be reported to the Vaccine Adverse Event Reporting System (VAERS). Your doctor might file this report, or you can do it yourself through the VAERS web site at www.vaers.hhs.gov, or by calling 1-800-822-7967. ? VAERS does not give medical advice. 6. The National Vaccine Injury Compensation Program The National   Vaccine Injury Compensation Program (VICP) is a federal program that was created to compensate people who may have been injured by certain vaccines. Persons who believe they may have been injured by a vaccine can learn about the program and about filing a claim by calling 1-800-338-2382 or visiting the VICP website at www.hrsa.gov/vaccinecompensation. There is a time limit to file a claim for compensation. 7. How can I learn more?  Ask your doctor. He or she can give you the vaccine package insert or suggest other sources of information.  Call your local or state health department.  Contact the Centers for Disease Control and Prevention (CDC): ? Call 1-800-232-4636 (1-800-CDC-INFO) or ? Visit CDC's website at www.cdc.gov/vaccines CDC Tdap Vaccine VIS (03/24/13) This information is not intended to replace advice given to you by your  health care provider. Make sure you discuss any questions you have with your health care provider. Document Released: 07/17/2011 Document Revised: 10/06/2015 Document Reviewed: 10/06/2015 Elsevier Interactive Patient Education  2017 Elsevier Inc.  

## 2017-12-06 NOTE — Telephone Encounter (Signed)
Per provider - Clld patient - DPR verified: LMOVM advising patient Rx for Ranitidine (Zantac) 150 mg 1 tablet by mouth 2 times daily #60 RF 0 has been electronically sent to [facility]. Advised patient to contact pharmacy to see when prescription will be filled.

## 2017-12-19 ENCOUNTER — Ambulatory Visit (INDEPENDENT_AMBULATORY_CARE_PROVIDER_SITE_OTHER): Payer: Medicaid Other | Admitting: Obstetrics and Gynecology

## 2017-12-19 VITALS — BP 108/67 | HR 103 | Wt 160.2 lb

## 2017-12-19 DIAGNOSIS — Z3A32 32 weeks gestation of pregnancy: Secondary | ICD-10-CM

## 2017-12-19 DIAGNOSIS — Z3403 Encounter for supervision of normal first pregnancy, third trimester: Secondary | ICD-10-CM

## 2017-12-19 NOTE — Progress Notes (Signed)
   PRENATAL VISIT NOTE  Subjective:  Jaclyn Owens is a 22 y.o. G2P1001 at 7089w1d being seen today for ongoing prenatal care.  She is currently monitored for the following issues for this low-risk pregnancy and has Adjustment disorder with depressed mood; Supervision of other normal pregnancy, antepartum; HSV-2 infection complicating pregnancy; and Bilateral calf pain on their problem list.  Patient reports no complaints.  Contractions: Not present. Vag. Bleeding: None.  Movement: Present. Denies leaking of fluid.   The following portions of the patient's history were reviewed and updated as appropriate: allergies, current medications, past family history, past medical history, past social history, past surgical history and problem list. Problem list updated.  Objective:   Vitals:   12/19/17 1401  BP: 108/67  Pulse: (!) 103  Weight: 72.7 kg    Fetal Status: Fetal Heart Rate (bpm): 144 Fundal Height: 32 cm Movement: Present     General:  Alert, oriented and cooperative. Patient is in no acute distress.  Skin: Skin is warm and dry. No rash noted.   Cardiovascular: Normal heart rate noted  Respiratory: Normal respiratory effort, no problems with respiration noted  Abdomen: Soft, gravid, appropriate for gestational age.  Pain/Pressure: Absent     Pelvic: Cervical exam deferred        Extremities: Normal range of motion.  Edema: None  Mental Status: Normal mood and affect. Normal behavior. Normal judgment and thought content.   Assessment and Plan:  Pregnancy: G2P1001 at 6489w1d  There are no diagnoses linked to this encounter. Preterm labor symptoms and general obstetric precautions including but not limited to vaginal bleeding, contractions, leaking of fluid and fetal movement were reviewed in detail with the patient. Please refer to After Visit Summary for other counseling recommendations.  No follow-ups on file.  Future Appointments  Date Time Provider Department Center    01/02/2018 10:10 AM Raelyn Moraawson, Rolitta, CNM [facility] None  01/16/2018 10:10 AM Raelyn Moraawson, Rolitta, CNM [facility] None  01/23/2018 10:10 AM Raelyn Moraawson, Rolitta, CNM [facility] None    Bernerd LimboJamilla R Travarus Trudo, Student-MidWife

## 2017-12-26 IMAGING — US US MFM OB FOLLOW-UP
1 series · 14 of 28 positions shown · non-contrast
Comparison: none

[Series 1: us mfm ob follow-up · 0.22mm/px · 14 of 39 slices shown]
[im 2/39]
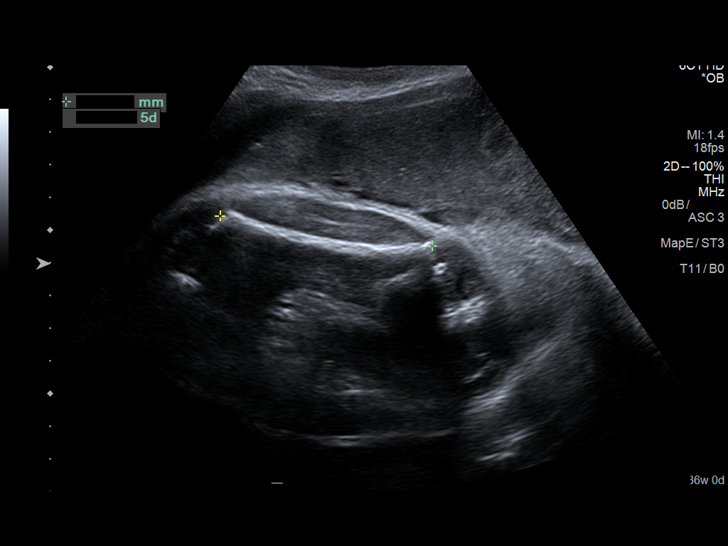
[im 5/39]
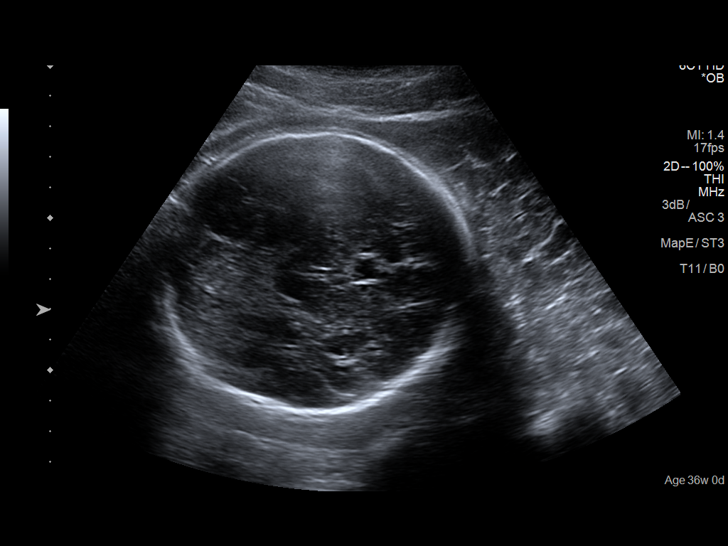
[im 8/39]
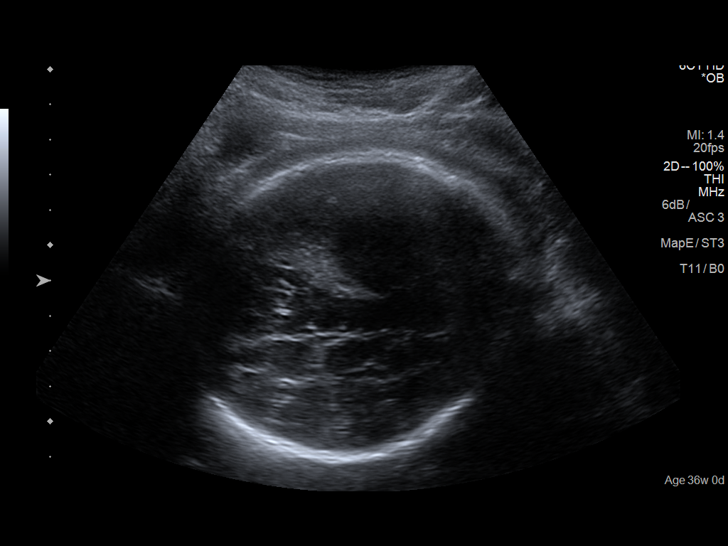
[im 10/39]
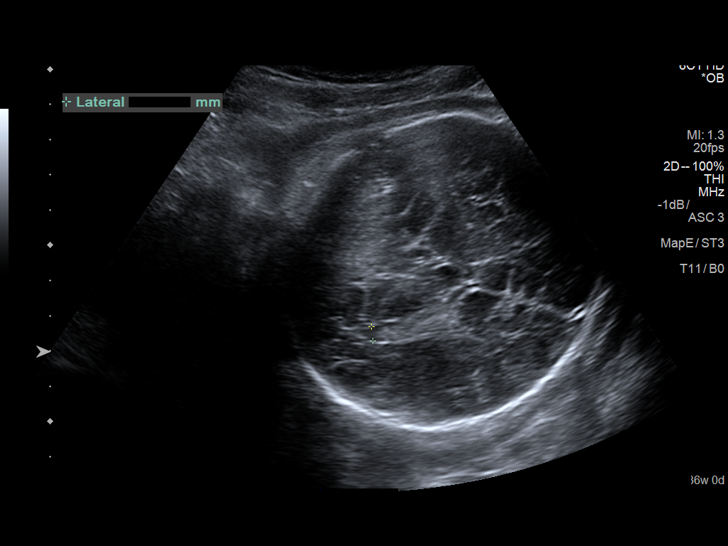
[im 13/39]
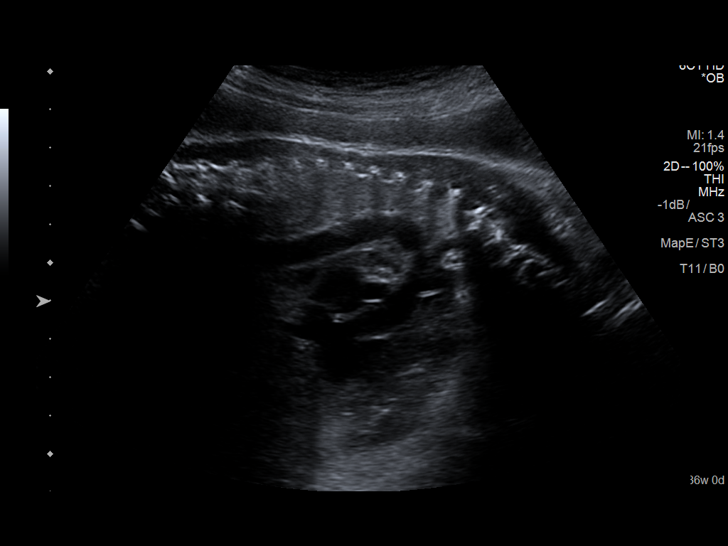
[im 16/39]
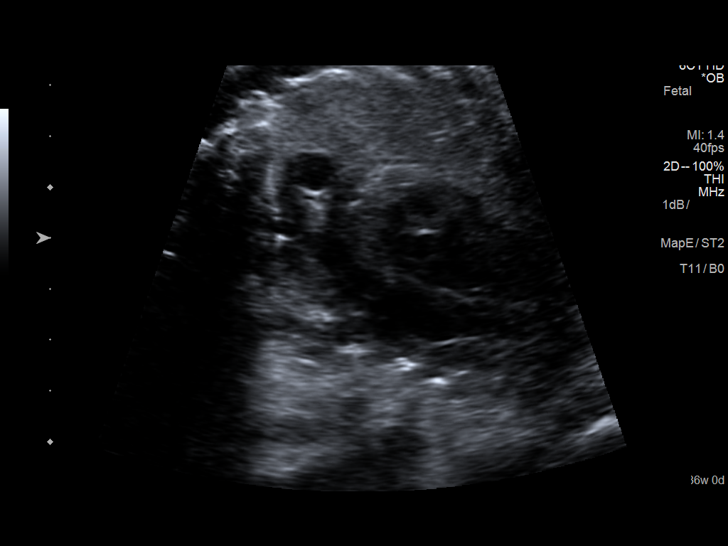
[im 19/39]
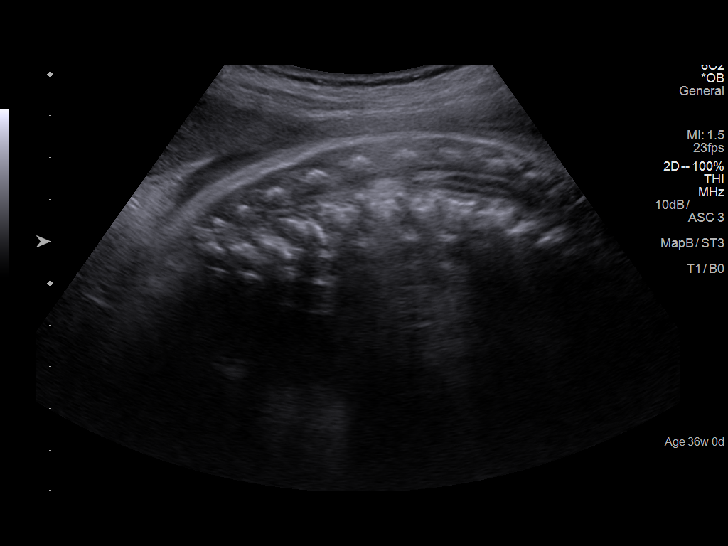
[im 22/39]
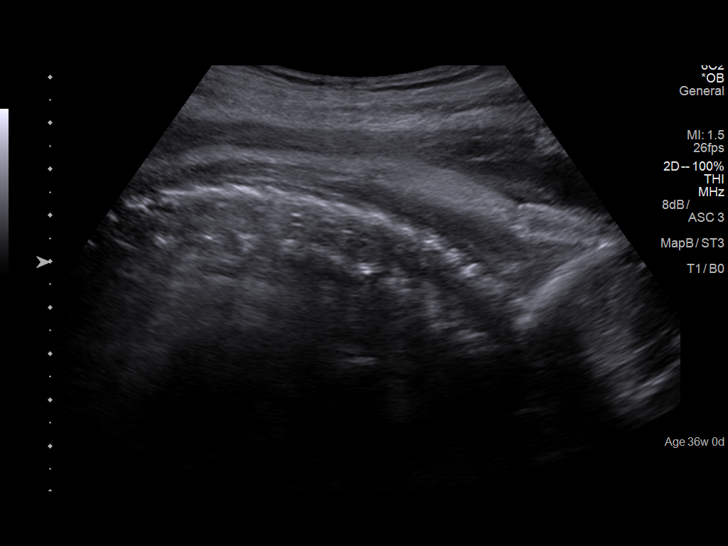
[im 24/39]
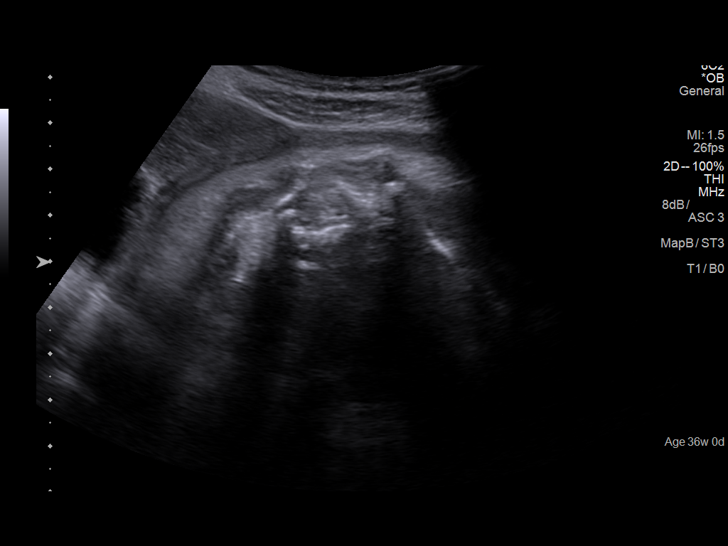
[im 27/39]
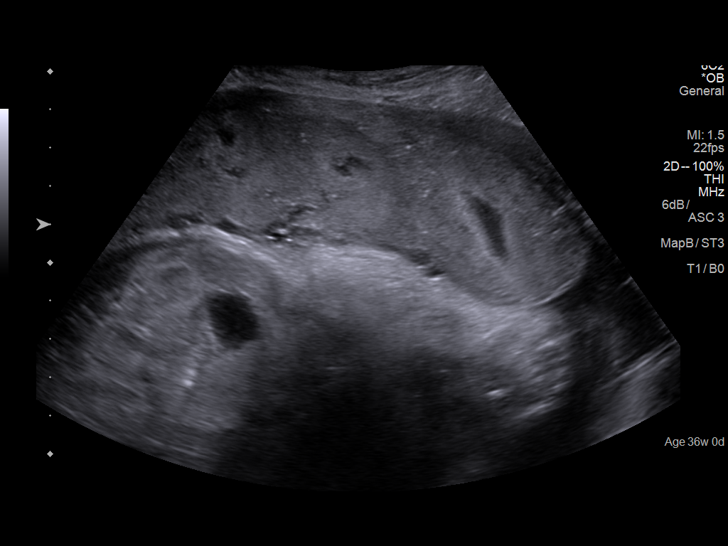
[im 30/39]
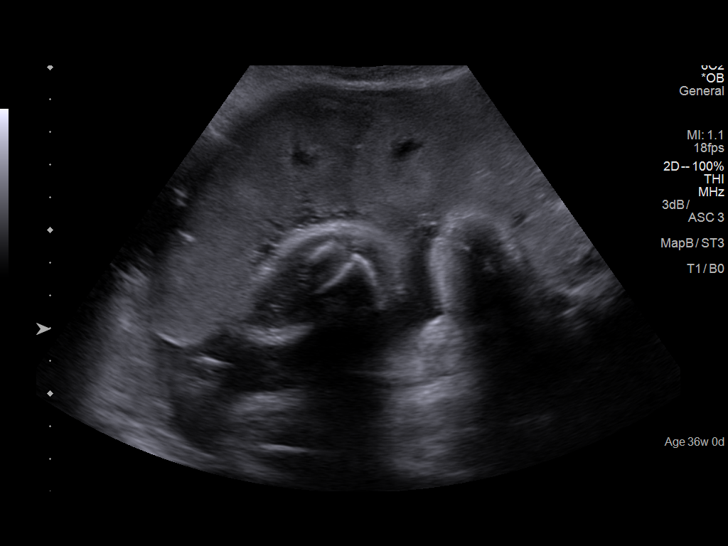
[im 33/39]
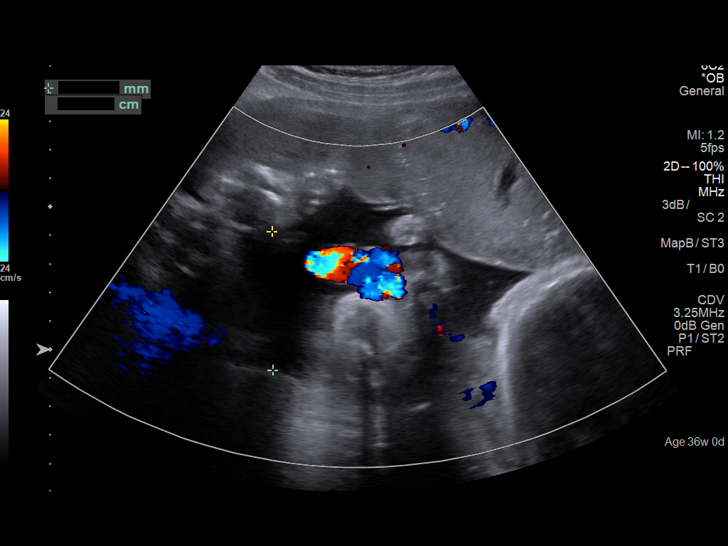
[im 36/39]
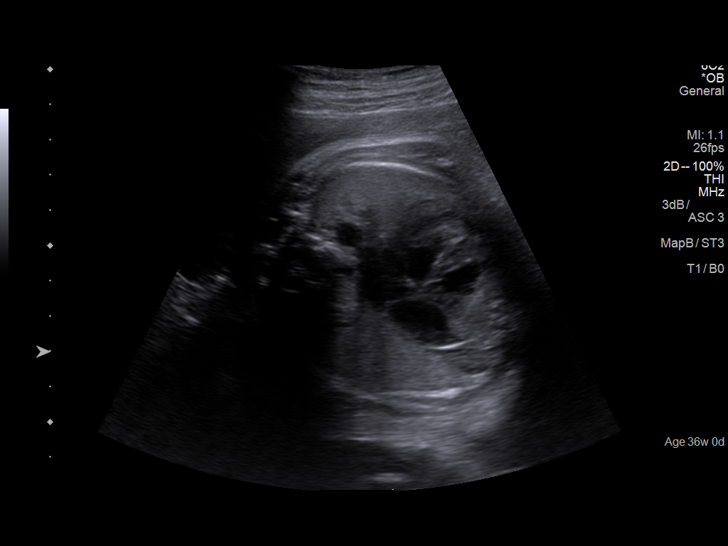
[im 39/39]
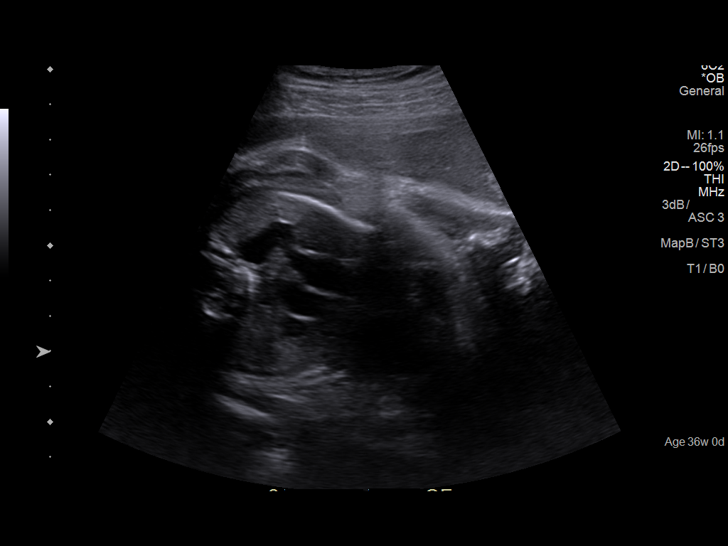

[14 of 28 positions shown; findings below may reference images not displayed]

Canned report from images found in remote index.

Refer to host system for actual result text.

## 2018-01-02 ENCOUNTER — Encounter: Payer: Self-pay | Admitting: Obstetrics and Gynecology

## 2018-01-10 ENCOUNTER — Inpatient Hospital Stay (HOSPITAL_COMMUNITY)
Admission: AD | Admit: 2018-01-10 | Discharge: 2018-01-10 | DRG: 833 | Disposition: A | Discharge status: Home / Self Care | Payer: Medicaid Other | Attending: Obstetrics & Gynecology | Admitting: Obstetrics & Gynecology

## 2018-01-10 ENCOUNTER — Telehealth: Payer: Self-pay

## 2018-01-10 ENCOUNTER — Encounter (HOSPITAL_COMMUNITY): Payer: Self-pay

## 2018-01-10 DIAGNOSIS — M79661 Pain in right lower leg: Secondary | ICD-10-CM

## 2018-01-10 DIAGNOSIS — M79662 Pain in left lower leg: Secondary | ICD-10-CM

## 2018-01-10 DIAGNOSIS — J45909 Unspecified asthma, uncomplicated: Secondary | ICD-10-CM | POA: Diagnosis present

## 2018-01-10 DIAGNOSIS — Z87891 Personal history of nicotine dependence: Secondary | ICD-10-CM | POA: Diagnosis not present

## 2018-01-10 DIAGNOSIS — O99513 Diseases of the respiratory system complicating pregnancy, third trimester: Secondary | ICD-10-CM | POA: Diagnosis present

## 2018-01-10 DIAGNOSIS — O4703 False labor before 37 completed weeks of gestation, third trimester: Secondary | ICD-10-CM | POA: Diagnosis present

## 2018-01-10 DIAGNOSIS — Z3A35 35 weeks gestation of pregnancy: Secondary | ICD-10-CM

## 2018-01-10 DIAGNOSIS — Z3480 Encounter for supervision of other normal pregnancy, unspecified trimester: Secondary | ICD-10-CM

## 2018-01-10 LAB — TYPE AND SCREEN
ABO/RH(D): B POS
Antibody Screen: NEGATIVE

## 2018-01-10 LAB — URINALYSIS, ROUTINE W REFLEX MICROSCOPIC
Bilirubin Urine: NEGATIVE
Glucose, UA: NEGATIVE mg/dL
Hgb urine dipstick: NEGATIVE
Ketones, ur: NEGATIVE mg/dL
Nitrite: NEGATIVE
Protein, ur: NEGATIVE mg/dL
Specific Gravity, Urine: 1.013 (ref 1.005–1.030)
WBC, UA: >50 WBC/hpf — ABNORMAL HIGH (ref 0–5)
pH: 7 (ref 5.0–8.0)

## 2018-01-10 LAB — CBC
HCT: 35.5 % — ABNORMAL LOW (ref 36.0–46.0)
Hemoglobin: 11.6 g/dL — ABNORMAL LOW (ref 12.0–15.0)
MCH: 28.6 pg (ref 26.0–34.0)
MCHC: 32.7 g/dL (ref 30.0–36.0)
MCV: 87.7 fL (ref 80.0–100.0)
Platelets: 204 10*3/uL (ref 150–400)
RBC: 4.05 MIL/uL (ref 3.87–5.11)
RDW: 13.4 % (ref 11.5–15.5)
WBC: 8.6 10*3/uL (ref 4.0–10.5)
nRBC: 0 % (ref 0.0–0.2)

## 2018-01-10 LAB — WET PREP, GENITAL
Clue Cells Wet Prep HPF POC: NONE SEEN
Sperm: NONE SEEN
Trich, Wet Prep: NONE SEEN
Yeast Wet Prep HPF POC: NONE SEEN

## 2018-01-10 LAB — ABO/RH: ABO/RH(D): B POS

## 2018-01-10 MED ORDER — ONDANSETRON HCL 4 MG/2ML IJ SOLN: 4.0000 mg | Freq: Four times a day (QID) | INTRAMUSCULAR | Status: DC | PRN

## 2018-01-10 MED ORDER — BETAMETHASONE SOD PHOS & ACET 6 (3-3) MG/ML IJ SUSP
12.0000 mg | Freq: Once | INTRAMUSCULAR | Status: AC
  Administered 2018-01-10: 12 mg via INTRAMUSCULAR
  Filled 2018-01-10: qty 2

## 2018-01-10 MED ORDER — CALCIUM CARBONATE ANTACID 500 MG PO CHEW: 2.0000 | CHEWABLE_TABLET | ORAL | Status: DC | PRN

## 2018-01-10 MED ORDER — VALACYCLOVIR HCL 500 MG PO TABS
1000.0000 mg | ORAL_TABLET | Freq: Every day | ORAL | Status: DC
  Filled 2018-01-10 (×2): qty 2

## 2018-01-10 MED ORDER — SODIUM CHLORIDE 0.9 % IV SOLN
5.0000 10*6.[IU] | Freq: Once | INTRAVENOUS | Status: AC
  Administered 2018-01-10: 5 10*6.[IU] via INTRAVENOUS
  Filled 2018-01-10: qty 5

## 2018-01-10 MED ORDER — LACTATED RINGERS IV SOLN
INTRAVENOUS | Status: DC
  Administered 2018-01-10: 18:00:00 via INTRAVENOUS

## 2018-01-10 MED ORDER — LACTATED RINGERS IV SOLN: 500.0000 mL | INTRAVENOUS | Status: DC | PRN

## 2018-01-10 MED ORDER — SOD CITRATE-CITRIC ACID 500-334 MG/5ML PO SOLN: 30.0000 mL | ORAL | Status: DC | PRN

## 2018-01-10 MED ORDER — ALBUTEROL SULFATE (2.5 MG/3ML) 0.083% IN NEBU: 2.5000 mg | INHALATION_SOLUTION | RESPIRATORY_TRACT | Status: DC | PRN

## 2018-01-10 MED ORDER — DOCUSATE SODIUM 100 MG PO CAPS: 100.0000 mg | ORAL_CAPSULE | Freq: Every day | ORAL | Status: DC

## 2018-01-10 MED ORDER — LIDOCAINE HCL (PF) 1 % IJ SOLN: 30.0000 mL | INTRAMUSCULAR | Status: DC | PRN

## 2018-01-10 MED ORDER — ACETAMINOPHEN 325 MG PO TABS: 650.0000 mg | ORAL_TABLET | ORAL | Status: DC | PRN

## 2018-01-10 MED ORDER — PRENATAL MULTIVITAMIN CH: 1.0000 | ORAL_TABLET | Freq: Every day | ORAL | Status: DC

## 2018-01-10 MED ORDER — FENTANYL CITRATE (PF) 100 MCG/2ML IJ SOLN: 100.0000 ug | INTRAMUSCULAR | Status: DC | PRN

## 2018-01-10 MED ORDER — BETAMETHASONE SOD PHOS & ACET 6 (3-3) MG/ML IJ SUSP: 12.0000 mg | Freq: Once | INTRAMUSCULAR | Status: DC

## 2018-01-10 MED ORDER — OXYTOCIN 40 UNITS IN LACTATED RINGERS INFUSION - SIMPLE MED: 2.5000 [IU]/h | INTRAVENOUS | Status: DC

## 2018-01-10 MED ORDER — OXYCODONE-ACETAMINOPHEN 5-325 MG PO TABS: 2.0000 | ORAL_TABLET | ORAL | Status: DC | PRN

## 2018-01-10 MED ORDER — ZOLPIDEM TARTRATE 5 MG PO TABS: 5.0000 mg | ORAL_TABLET | Freq: Every evening | ORAL | Status: DC | PRN

## 2018-01-10 MED ORDER — ACETAMINOPHEN 325 MG PO TABS
650.0000 mg | ORAL_TABLET | ORAL | Status: DC | PRN
  Administered 2018-01-10: 650 mg via ORAL
  Filled 2018-01-10: qty 2

## 2018-01-10 MED ORDER — OXYCODONE-ACETAMINOPHEN 5-325 MG PO TABS: 1.0000 | ORAL_TABLET | ORAL | Status: DC | PRN

## 2018-01-10 MED ORDER — PENICILLIN G 3 MILLION UNITS IVPB - SIMPLE MED: 3.0000 10*6.[IU] | INTRAVENOUS | Status: DC

## 2018-01-10 MED ORDER — OXYTOCIN BOLUS FROM INFUSION: 500.0000 mL | Freq: Once | INTRAVENOUS | Status: DC

## 2018-01-10 NOTE — MAU Provider Note (Signed)
History     CSN: [id number]  Arrival date and time: 01/10/18 2355   First Provider Initiated Contact with Patient 01/10/18 [phone]      Chief Complaint  Patient presents with  . Contractions  . Back Pain   Jaclyn Owens is a 22 y.o. G2P1 at [redacted]w[redacted]d who presents to MAU with complaints of contractions. She report she has been having contractions all week, reports contractions worsened today while at work. Contractions occur every 5-7 minutes per patient. She reports contractions start in her lower abdomen and radiates to her back. She denies hx of PTL or PTD. +FM. Denies vaginal bleeding, vaginal discharge, or N/V.    OB History    Gravida  2   Para  1   Term  1   Preterm  0   AB  0   Living  1     SAB  0   TAB  0   Ectopic  0   Multiple  0   Live Births  0           Past Medical History:  Diagnosis Date  . Asthma     Past Surgical History:  Procedure Laterality Date  . ARM WOUND REPAIR / CLOSURE      Family History  Problem Relation Age of Onset  . Asthma Mother   . Diabetes Maternal Aunt   . Cancer Maternal Grandmother     Social History   Tobacco Use  . Smoking status: Former Smoker    Types: Cigarettes, Cigars  . Smokeless tobacco: Never Used  Substance Use Topics  . Alcohol use: Not Currently  . 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 Bee Venom Anaphylaxis    Medications Prior to Admission  Medication Sig Dispense Refill Last Dose  . albuterol (PROVENTIL HFA;VENTOLIN HFA) 108 (90 Base) MCG/ACT inhaler Inhale 1-2 puffs into the lungs every 4 (four) hours as needed for wheezing or shortness of breath. 1 Inhaler 2 Taking  . metroNIDAZOLE (FLAGYL) 500 MG tablet Take 500 mg by mouth 2 (two) times daily.  0 Taking  . Prenatal Multivit-Min-Fe-FA (PRENATAL/IRON) TABS Take by mouth.   Taking  . ranitidine (ZANTAC) 150 MG tablet Take 1 tablet (150 mg total) by mouth 2  (two) times daily. 60 tablet 0   . terconazole (TERAZOL 7) 0.4 % vaginal cream Place 1 applicator vaginally at bedtime. 45 g 0 Taking  . valACYclovir (VALTREX) 1000 MG tablet Take 1 tablet (1,000 mg total) by mouth daily. 30 tablet 11 Taking    Review of Systems  Constitutional: Negative.   Respiratory: Negative.   Cardiovascular: Negative.   Gastrointestinal: Positive for abdominal pain. Negative for constipation, diarrhea, nausea and vomiting.       Contractions  Genitourinary: Negative.   Neurological: Negative.    Physical Exam   Blood pressure 107/71, pulse 96, temperature 98.2 F (36.8 C), resp. rate 16, weight 75.8 kg, last menstrual period 05/08/2017, SpO2 100 %, unknown if currently breastfeeding.  Physical Exam  Nursing note and vitals reviewed. Constitutional: She is oriented to person, place, and time. She appears well-developed and well-nourished. No distress.  Cardiovascular: Normal rate, regular rhythm and normal heart sounds.  Respiratory: Effort normal and breath sounds normal. No respiratory distress. She has no wheezes. She has no rales.  GI: Soft.  Gravid appropriate for gestational age, mild contractions palpated   Musculoskeletal:  Normal range of motion.        General: No edema.  Neurological: She is alert and oriented to person, place, and time.   Dilation: 1 Effacement (%): Thick Cervical Position: Posterior Station: Ballotable Presentation: Vertex Exam by:: Lanice ShirtsV Abrar Bilton CNM  FHR: 135/ moderate/ +accels/ no decelerations  Toco: 3-8 minutes/ mild by palpation   MAU Course  Procedures  MDM Hydration  Cervical examination   Cervical examination at 1030 noted 1cm/thick cervix, reassessment around 1200 not minimal change to 1.5cm/thick with scant bloody show. No current outbreak of HSV.   BMZ and Valtrex ordered. Reassessment around 1330 noted cervix of 2cm and increased bloody show.  Consult with Dr Macon LargeAnyanwu on assessment and management, recommends  admission to L&D, GBS/GC/wet prep swab prior to admission, and penicillin for unknown GBS.   Orders placed, labor team notified and patient admitted to L&D.   Assessment and Plan   1. Threatened premature labor in third trimester   2. [redacted] weeks gestation of pregnancy    Admit to labor  GBS pending  BMZ and Valtrex   Sharyon CableVeronica C Barak Bialecki CNM 01/10/2018, 2:29 PM

## 2018-01-10 NOTE — Telephone Encounter (Signed)
Patient called in reporting painful 5-7 minutes contractions and feeling like she has to poop but can not; white clear like discharge; PS 8/10 - lower/upper abdominal and pelvic pressure/pain. Patient reports she felt this way when she was having her son.   Patient advised she was presently at work - concerned on what she should do...ad[facility] and [facility]. Patient stated she feels she needs to go as well.

## 2018-01-10 NOTE — MAU Note (Signed)
Pt states she was having ctx at work and feeling pressure. +FM, no LOF or bleeding. Pt states her pain is 8/10, but on the phone throughout triage.

## 2018-01-10 NOTE — Anesthesia Pain Management Evaluation Note (Signed)
  [facility] Pain Management Visit Note  Patient: Jaclyn Owens, 22 y.o., female  "Hello I am a member of the anesthesia team at [facility]. We have an anesthesia team available at all times to provide care throughout the hospital, including epidural management and anesthesia for C-section. I don't know your plan for the delivery whether it a natural birth, water birth, IV sedation, nitrous supplementation, doula or epidural, but we want to meet your pain goals."   1.Was your pain managed to your expectations on prior hospitalizations?   Yes   2.What is your expectation for pain management during this hospitalization?     Epidural  3.How can we help you reach that goal? epidural  Record the patient's initial score and the patient's pain goal.   Pain: 8  Pain Goal: 8 [facility] wants you to be able to say your pain was always managed very well.  Lorenda Grecco 01/10/2018

## 2018-01-10 NOTE — Discharge Instructions (Signed)
Braxton Hicks Contractions °Contractions of the uterus can occur throughout pregnancy, but they are not always a sign that you are in labor. You may have practice contractions called Braxton Hicks contractions. These false labor contractions are sometimes confused with true labor. °What are Braxton Hicks contractions? °Braxton Hicks contractions are tightening movements that occur in the muscles of the uterus before labor. Unlike true labor contractions, these contractions do not result in opening (dilation) and thinning of the cervix. Toward the end of pregnancy (32-34 weeks), Braxton Hicks contractions can happen more often and may become stronger. These contractions are sometimes difficult to tell apart from true labor because they can be very uncomfortable. You should not feel embarrassed if you go to the hospital with false labor. °Sometimes, the only way to tell if you are in true labor is for your health care provider to look for changes in the cervix. The health care provider will do a physical exam and may monitor your contractions. If you are not in true labor, the exam should show that your cervix is not dilating and your water has not broken. °If there are other health problems associated with your pregnancy, it is completely safe for you to be sent home with false labor. You may continue to have Braxton Hicks contractions until you go into true labor. °How to tell the difference between true labor and false labor °True labor °· Contractions last 30-70 seconds. °· Contractions become very regular. °· Discomfort is usually felt in the top of the uterus, and it spreads to the lower abdomen and low back. °· Contractions do not go away with walking. °· Contractions usually become more intense and increase in frequency. °· The cervix dilates and gets thinner. °False labor °· Contractions are usually shorter and not as strong as true labor contractions. °· Contractions are usually irregular. °· Contractions  are often felt in the front of the lower abdomen and in the groin. °· Contractions may go away when you walk around or change positions while lying down. °· Contractions get weaker and are shorter-lasting as time goes on. °· The cervix usually does not dilate or become thin. °Follow these instructions at home: °· Take over-the-counter and prescription medicines only as told by your health care provider. °· Keep up with your usual exercises and follow other instructions from your health care provider. °· Eat and drink lightly if you think you are going into labor. °· If Braxton Hicks contractions are making you uncomfortable: °? Change your position from lying down or resting to walking, or change from walking to resting. °? Sit and rest in a tub of warm water. °? Drink enough fluid to keep your urine pale yellow. Dehydration may cause these contractions. °? Do slow and deep breathing several times an hour. °· Keep all follow-up prenatal visits as told by your health care provider. This is important. °Contact a health care provider if: °· You have a fever. °· You have continuous pain in your abdomen. °Get help right away if: °· Your contractions become stronger, more regular, and closer together. °· You have fluid leaking or gushing from your vagina. °· You pass blood-tinged mucus (bloody show). °· You have bleeding from your vagina. °· You have low back pain that you never had before. °· You feel your baby’s head pushing down and causing pelvic pressure. °· Your baby is not moving inside you as much as it used to. °Summary °· Contractions that occur before labor are called Braxton   Hicks contractions, false labor, or practice contractions. °· Braxton Hicks contractions are usually shorter, weaker, farther apart, and less regular than true labor contractions. True labor contractions usually become progressively stronger and regular and they become more frequent. °· Manage discomfort from Braxton Hicks contractions by  changing position, resting in a warm bath, drinking plenty of water, or practicing deep breathing. °This information is not intended to replace advice given to you by your health care provider. Make sure you discuss any questions you have with your health care provider. °Document Released: 05/31/2016 Document Revised: 05/31/2016 Document Reviewed: 05/31/2016 °Elsevier Interactive Patient Education © 2018 [employer] ° °

## 2018-01-10 NOTE — Discharge Summary (Signed)
Physician Discharge Summary  Patient ID: Jaclyn Owens MRN: [id number] DOB/AGE: 06-Aug-1995 22 y.o.  Admit date: 01/10/2018 Discharge date: 01/10/2018  Admission Diagnoses:threatened preterm labor, [redacted]w[redacted]d  Discharge Diagnoses:  Principal Problem:   Threatened preterm labor, third trimester Active Problems:   Preterm labor in third trimester   Discharged Condition: good  Hospital Course:  . Contractions  . Back Pain   Jaclyn Owens is a 22 y.o. G2P1 at [redacted]w[redacted]d who presents to MAU with complaints of contractions. She report she has been having contractions all week, reports contractions worsened today while at work. Contractions occur every 5-7 minutes per patient. She reports contractions start in her lower abdomen and radiates to her back. She denies hx of PTL or PTD. +FM. Denies vaginal bleeding, vaginal discharge, or N/V.                    OB History   Gravida  2   Para  1   Term  1   Preterm  0   AB  0   Living  1     SAB  0   TAB  0   Ectopic  0   Multiple  0   Live Births  0              Past Medical History:  Diagnosis Date  . Asthma          Past Surgical History:  Procedure Laterality Date  . ARM WOUND REPAIR / CLOSURE           Family History  Problem Relation Age of Onset  . Asthma Mother   . Diabetes Maternal Aunt   . Cancer Maternal Grandmother     Social History        Tobacco Use  . Smoking status: Former Smoker    Types: Cigarettes, Cigars  . Smokeless tobacco: Never Used  Substance Use Topics  . Alcohol use: Not Currently  . Drug use: No    Types: Marijuana    Comment: MJ use 01-24-15    Allergies:     Allergies  Allergen Reactions  . Bee Venom Anaphylaxis           Medications Prior to Admission  Medication Sig Dispense Refill Last Dose  . albuterol (PROVENTIL HFA;VENTOLIN HFA) 108 (90 Base) MCG/ACT inhaler Inhale 1-2 puffs into the  lungs every 4 (four) hours as needed for wheezing or shortness of breath. 1 Inhaler 2 Taking  . metroNIDAZOLE (FLAGYL) 500 MG tablet Take 500 mg by mouth 2 (two) times daily.  0 Taking  . Prenatal Multivit-Min-Fe-FA (PRENATAL/IRON) TABS Take by mouth.   Taking  . ranitidine (ZANTAC) 150 MG tablet Take 1 tablet (150 mg total) by mouth 2 (two) times daily. 60 tablet 0   . terconazole (TERAZOL 7) 0.4 % vaginal cream Place 1 applicator vaginally at bedtime. 45 g 0 Taking  . valACYclovir (VALTREX) 1000 MG tablet Take 1 tablet (1,000 mg total) by mouth daily. 30 tablet 11 Taking    Review of Systems  Constitutional:Negative.  Respiratory:Negative.  Cardiovascular:Negative.  Gastrointestinal: Positive forabdominal pain. Negative forconstipation,diarrhea,nauseaand vomiting. Contractions Genitourinary:Negative.  Neurological:Negative.  Physical Exam   Blood pressure 107/71, pulse 96, temperature 98.2 F (36.8 C), resp. rate 16, weight 75.8 kg, last menstrual period 05/08/2017, SpO2 100 %, unknown if currently breastfeeding.  Physical Exam Nursing noteand vitalsreviewed. Constitutional: She isoriented to person, place, and time. She appearswell-developedand well-nourished.No distress.  Cardiovascular:Normal rate,regular rhythmand normal heart sounds.  Respiratory:Effort  normaland breath sounds normal. Norespiratory distress. She hasno wheezes. She hasno rales.  ZO:XWRUGI:Soft. Gravid appropriate for gestational age, mild contractions palpated Musculoskeletal:Normal range of motion.  General: No edema.  Neurological: She isalertand oriented to person, place, and time.  Dilation: 1 Effacement (%): Thick Cervical Position: Posterior Station: Ballotable Presentation: Vertex Exam by:: Lanice ShirtsV Rogers CNM  FHR:135/ moderate/ +accels/ no decelerations Toco:3-8 minutes/ mild by palpation  MAU Course  Procedures  MDM Hydration   Cervical examination  Cervical examination at 1030 noted 1cm/thick cervix, reassessment around 1200 not minimal change to 1.5cm/thick with scant bloody show. No current outbreak of HSV.   BMZ and Valtrex ordered. Reassessment around 1330 noted cervix of 2cm and increased bloody show.  Consult with Dr Macon LargeAnyanwu on assessment and management, recommends admission to L&D, GBS/GC/wet prep swab prior to admission, and penicillin for unknown GBS.   Orders placed, labor team notified and patient admitted to L&D.  Assessment and Plan   1. Threatened premature labor in third trimester  2. [redacted] weeks gestation of pregnancy   Patient was admitted to L&D for monitoring and she received IVF. Her contractions abated and her cervical exam was unchanged, with good fetal monitoring. She was instructed to return to MAU at 1200 tomorrow to receive a second dose of betamethasone.  Consults: None  Significant Diagnostic Studies: labs:  CBC    Component Value Date/Time   WBC 8.6 01/10/2018 1406   RBC 4.05 01/10/2018 1406   HGB 11.6 (L) 01/10/2018 1406   HGB 11.2 11/21/2017 0828   HCT 35.5 (L) 01/10/2018 1406   HCT 33.6 (L) 11/21/2017 0828   PLT 204 01/10/2018 1406   PLT 207 11/21/2017 0828   MCV 87.7 01/10/2018 1406   MCV 88 11/21/2017 0828   MCH 28.6 01/10/2018 1406   MCHC 32.7 01/10/2018 1406   RDW 13.4 01/10/2018 1406   RDW 13.0 11/21/2017 0828   LYMPHSABS 1.8 08/29/2017 1433   MONOABS 0.6 07/07/2014 0001   EOSABS 0.2 08/29/2017 1433   BASOSABS 0.0 08/29/2017 1433     Treatments: IV hydration and betamethasone  Discharge Exam: Blood pressure 132/77, pulse (!) 107, temperature 98.8 F (37.1 C), temperature source Oral, resp. rate 17, height 5\' 6"  (1.676 m), weight 75.8 kg, last menstrual period 05/08/2017, SpO2 100 %, unknown if currently breastfeeding. General appearance: alert, cooperative and no distress Pelvic: cervix normal in appearance and external genitalia  normal Extremities: extremities normal, atraumatic, no cyanosis or edema Cervical Exam  Dilation 1.5 filed at 01/10/2018 1912  Effacement (%) 50 filed at 01/10/2018 1912  Cervical Position Posterior filed at 01/10/2018 1339  Vag. Bleeding Bloody Show filed at 01/10/2018 1339  Station -3 filed at 01/10/2018 1912  Presentation Vertex filed at 01/10/2018 1912  Exam by: Dr. Debroah LoopArnold filed at 01/10/2018 1912    Disposition: Discharge disposition: 01-Home or Self Care        Allergies as of 01/10/2018      Reactions   Bee Venom Anaphylaxis      Medication List    TAKE these medications   acetaminophen 325 MG tablet Commonly known as:  TYLENOL Take 650 mg by mouth every 6 (six) hours as needed.   valACYclovir 1000 MG tablet Commonly known as:  VALTREX Take 1 tablet (1,000 mg total) by mouth daily.      Follow-up Information    [facility] Follow up on 01/16/2018.   Specialty:  Obstetrics and Gynecology Contact information: [address] [place]  [facility] [place] [address] [phone]         [facility]   [phone]  [address] [place] [address] 12/14 for betamethasone     Signed: Scheryl Darter 01/10/2018, 7:28 PM

## 2018-01-10 NOTE — H&P (Signed)
History    CSN: [id number]   Arrival date and time: 01/10/18 [id number]    First Provider Initiated Contact with Patient 01/10/18 [phone]          Chief Complaint  Patient presents with  . Contractions  . Back Pain    Jaclyn Owens is a 22 y.o. G2P1 at 3441w2d who presents to MAU with complaints of contractions. She report she has been having contractions all week, reports contractions worsened today while at work. Contractions occur every 5-7 minutes per patient. She reports contractions start in her lower abdomen and radiates to her back. She denies hx of PTL or PTD. +FM. Denies vaginal bleeding, vaginal discharge, or N/V.              OB History     Gravida  2   Para  1   Term  1   Preterm  0   AB  0   Living  1      SAB  0   TAB  0   Ectopic  0   Multiple  0   Live Births  0                   Past Medical History:  Diagnosis Date  . Asthma             Past Surgical History:  Procedure Laterality Date  . ARM WOUND REPAIR / CLOSURE               Family History  Problem Relation Age of Onset  . Asthma Mother    . Diabetes Maternal Aunt    . Cancer Maternal Grandmother        Social History         Tobacco Use  . Smoking status: Former Smoker      Types: Cigarettes, Cigars  . Smokeless tobacco: Never Used  Substance Use Topics  . Alcohol use: Not Currently  . Drug use: No      Types: Marijuana      Comment: MJ use 01-24-15      Allergies:      Allergies  Allergen Reactions  . Bee Venom Anaphylaxis             Medications Prior to Admission  Medication Sig Dispense Refill Last Dose  . albuterol (PROVENTIL HFA;VENTOLIN HFA) 108 (90 Base) MCG/ACT inhaler Inhale 1-2 puffs into the lungs every 4 (four) hours as needed for wheezing or shortness of breath. 1 Inhaler 2 Taking  . metroNIDAZOLE (FLAGYL) 500 MG tablet Take 500 mg by mouth 2 (two) times daily.   0 Taking  . Prenatal Multivit-Min-Fe-FA (PRENATAL/IRON) TABS Take by mouth.      Taking  . ranitidine (ZANTAC) 150 MG tablet Take 1 tablet (150 mg total) by mouth 2 (two) times daily. 60 tablet 0    . terconazole (TERAZOL 7) 0.4 % vaginal cream Place 1 applicator vaginally at bedtime. 45 g 0 Taking  . valACYclovir (VALTREX) 1000 MG tablet Take 1 tablet (1,000 mg total) by mouth daily. 30 tablet 11 Taking      Review of Systems  Constitutional: Negative.   Respiratory: Negative.   Cardiovascular: Negative.   Gastrointestinal: Positive for abdominal pain. Negative for constipation, diarrhea, nausea and vomiting.       Contractions  Genitourinary: Negative.   Neurological: Negative.     Physical Exam    Blood pressure 107/71, pulse 96, temperature 98.2 F (36.8 C), resp. rate 16,  weight 75.8 kg, last menstrual period 05/08/2017, SpO2 100 %, unknown if currently breastfeeding.   Physical Exam  Nursing note and vitals reviewed. Constitutional: She is oriented to person, place, and time. She appears well-developed and well-nourished. No distress.  Cardiovascular: Normal rate, regular rhythm and normal heart sounds.  Respiratory: Effort normal and breath sounds normal. No respiratory distress. She has no wheezes. She has no rales.  GI: Soft.  Gravid appropriate for gestational age, mild contractions palpated   Musculoskeletal: Normal range of motion.        General: No edema.  Neurological: She is alert and oriented to person, place, and time.    Dilation: 1 Effacement (%): Thick Cervical Position: Posterior Station: Ballotable Presentation: Vertex Exam by:: Lanice Shirts CNM   FHR: 135/ moderate/ +accels/ no decelerations  Toco: 3-8 minutes/ mild by palpation    MAU Course  Procedures   MDM Hydration  Cervical examination    Cervical examination at 1030 noted 1cm/thick cervix, reassessment around 1200 not minimal change to 1.5cm/thick with scant bloody show. No current outbreak of HSV.    BMZ and Valtrex ordered. Reassessment around 1330 noted cervix of 2cm  and increased bloody show.  Consult with Dr Macon Large on assessment and management, recommends admission to L&D, GBS/GC/wet prep swab prior to admission, and penicillin for unknown GBS.    Orders placed, labor team notified and patient admitted to L&D.    Assessment and Plan    1. Threatened premature labor in third trimester   2. [redacted] weeks gestation of pregnancy     Admit to L&D  GBS pending, will treat with PCN BMZ and Valtrex    Sharyon Cable CNM 01/10/2018, 2:29 PM    Attestation of Attending Supervision of Advanced Practice Provider (PA/CNM/NP): Evaluation and management procedures were performed by the Advanced Practice Provider under my supervision and collaboration.  I have reviewed the Advanced Practice Provider's note and chart, and I agree with the management and plan.  Jaynie Collins, MD, FACOG Obstetrician & Gynecologist, [facility] for [employer], [facility]

## 2018-01-11 ENCOUNTER — Encounter (HOSPITAL_COMMUNITY): Payer: Self-pay

## 2018-01-11 ENCOUNTER — Other Ambulatory Visit: Payer: Self-pay

## 2018-01-11 ENCOUNTER — Inpatient Hospital Stay (HOSPITAL_COMMUNITY)
Admission: AD | Admit: 2018-01-11 | Discharge: 2018-01-11 | Disposition: A | Discharge status: Home / Self Care | Payer: Medicaid Other | Source: Ambulatory Visit | Attending: Family Medicine | Admitting: Family Medicine

## 2018-01-11 DIAGNOSIS — O4693 Antepartum hemorrhage, unspecified, third trimester: Secondary | ICD-10-CM | POA: Diagnosis not present

## 2018-01-11 DIAGNOSIS — J45909 Unspecified asthma, uncomplicated: Secondary | ICD-10-CM | POA: Diagnosis not present

## 2018-01-11 DIAGNOSIS — O4703 False labor before 37 completed weeks of gestation, third trimester: Secondary | ICD-10-CM

## 2018-01-11 DIAGNOSIS — Z87891 Personal history of nicotine dependence: Secondary | ICD-10-CM | POA: Insufficient documentation

## 2018-01-11 DIAGNOSIS — Z3A35 35 weeks gestation of pregnancy: Secondary | ICD-10-CM | POA: Diagnosis not present

## 2018-01-11 HISTORY — DX: Herpesviral infection, unspecified: B00.9

## 2018-01-11 LAB — RPR: RPR Ser Ql: NONREACTIVE

## 2018-01-11 MED ORDER — BETAMETHASONE SOD PHOS & ACET 6 (3-3) MG/ML IJ SUSP
12.0000 mg | Freq: Once | INTRAMUSCULAR | Status: AC
  Administered 2018-01-11: 12 mg via INTRAMUSCULAR
  Filled 2018-01-11: qty 2

## 2018-01-11 NOTE — MAU Note (Addendum)
Jaclyn Owens is a [age] y.o. at 6624w3d here in MAU reporting: for 2nd BMZ Desires her cervix to be checked.  Reports increased vaginal bleeding and contractions. Vitals:   01/11/18 1411  BP: (!) 103/58  Pulse: 99  Resp: 18  Temp: 97.9 F (36.6 C)  SpO2: 99%  +FM Verbalizes that last VE prior to discharge yesterday was 2cm Lab orders placed from triage: none

## 2018-01-11 NOTE — MAU Provider Note (Signed)
Chief Complaint:  Injections; Contractions; and Vaginal Bleeding   First Provider Initiated Contact with Patient 01/11/18 1432     HPI: Sindy GuadeloupeYaKwanda Jia is a 22 y.o. G2P1001 at 1353w3d who presents to maternity admissions reporting UC's every 7 mins and increased VB ("more red and dark brown") since last night. Denies leakage of fluid. Good fetal movement.  Location: abdomen Quality: dull Severity: 1/10 in pain scale Duration: since last night Timing: intermittent Modifying factors: none Associated signs and symptoms: none  Pregnancy Course:   Past Medical History:  Diagnosis Date  . Asthma   . HSV (herpes simplex virus) infection    OB History  Gravida Para Term Preterm AB Living  2 1 1  0 0 1  SAB TAB Ectopic Multiple Live Births  0 0 0 0 0    # Outcome Date GA Lbr Len/2nd Weight Sex Delivery Anes PTL Lv  2 Current           1 Term 08/06/15 712w2d  2580 g  Vag-Spont      Past Surgical History:  Procedure Laterality Date  . ARM WOUND REPAIR / CLOSURE     Family History  Problem Relation Age of Onset  . Asthma Mother   . Diabetes Maternal Aunt   . Cancer Maternal Grandmother    Social History   Tobacco Use  . Smoking status: Former Smoker    Types: Cigarettes, Cigars  . Smokeless tobacco: Never Used  Substance Use Topics  . Alcohol use: Not Currently  . 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 Bee Venom Anaphylaxis   No medications prior to admission.    I have reviewed patient's Past Medical Hx, Surgical Hx, Family Hx, Social Hx, medications and allergies.   ROS:  Review of Systems  Constitutional: Negative.   HENT: Negative.   Eyes: Negative.   Respiratory: Negative.   Cardiovascular: Negative.   Gastrointestinal: Negative.   Endocrine: Negative.   Genitourinary: Positive for pelvic pain (UC's every 7 mins) and vaginal bleeding (pinkish, brown, red spotting with wiping).  Musculoskeletal:  Negative.   Skin: Negative.   Allergic/Immunologic: Negative.   Neurological: Negative.   Hematological: Negative.   Psychiatric/Behavioral: Negative.     Physical Exam   Patient Vitals for the past 24 hrs:  BP Temp Temp src Pulse Resp SpO2  01/11/18 1535 100/63 - - - 18 -  01/11/18 1500 (!) 90/44 - - - - -  01/11/18 1411 (!) 103/58 97.9 F (36.6 C) Oral 99 18 99 %    Constitutional: Well-developed, well-nourished female in no acute distress.  Cardiovascular: normal rate & rhythm, no murmur Respiratory: normal effort, lung sounds clear throughout GI: Abd soft, non-tender, gravid appropriate for gestational age. Pos BS x 4 MS: Extremities nontender, no edema, normal ROM Neurologic: Alert and oriented x 4.  GU:      Pelvic: NEFG, physiologic discharge, no blood - only pink tinged vaginal d/c, cervix clean.     NST - FHR: 140 bpm / moderate variability / accels present / decels absent / TOCO: 1-2 UC's with UI noted   Labs: No results found for this or any previous visit (from the past 24 hour(s)).  Imaging:  No results found.  MAU Course: Orders Placed This Encounter  Procedures  . Discharge patient   Meds ordered this encounter  Medications  . betamethasone acetate-betamethasone sodium phosphate (CELESTONE) injection 12 mg  MDM:  Assessment: 1. Threatened preterm labor, third trimester     Plan: Discharge home in stable condition.  Preterm Labor precautions and fetal kick counts  Follow-up Information    [facility]. Go on 01/16/2018.   Specialty:  Obstetrics and Gynecology Why:  As scheduled Contact information: [address] [place] [address] [phone]          Allergies as of 01/11/2018      Reactions   Bee Venom Anaphylaxis      Medication List    TAKE these medications   acetaminophen 325 MG tablet Commonly known as:  TYLENOL Take 650 mg by mouth every 6 (six) hours as needed.    valACYclovir 1000 MG tablet Commonly known as:  VALTREX Take 1 tablet (1,000 mg total) by mouth daily.       Raelyn Mora, CNM 01/11/2018 2:32 PM

## 2018-01-13 ENCOUNTER — Telehealth: Payer: Self-pay | Admitting: *Deleted

## 2018-01-13 LAB — CULTURE, BETA STREP (GROUP B ONLY)

## 2018-01-13 LAB — GC/CHLAMYDIA PROBE AMP (~~LOC~~) NOT AT ARMC
Chlamydia: NEGATIVE
Neisseria Gonorrhea: NEGATIVE

## 2018-01-13 NOTE — Telephone Encounter (Signed)
-----   Message from Raelyn Moraolitta Dawson, [place] sent at 01/13/2018  2:11 PM EST ----- Please notify of (+) GBS and the need for antibiotics in labor.

## 2018-01-13 NOTE — Telephone Encounter (Signed)
Patient called and GBS explained to patient.  Pt stated that she is having contractions 9-10 minutes apart that are stronger. Pt was seen at MAU recently for having contractions. Pt is also having a lot of back pain. Pt denies vaginal bleeding or leaking of fluid. Pt asked if she was drinking plenty of water. Per pt she has been drinking a lot of water since her visit at MAU. Pt asked has she tried laying down or taking a warm bath, phone was disconnected. Tried calling patient back, lady answered stating she will need to go to patient and have her call clinic back. Will send message to Midwife.  Clovis PuMartin, Tamika L, RN

## 2018-01-16 ENCOUNTER — Ambulatory Visit (INDEPENDENT_AMBULATORY_CARE_PROVIDER_SITE_OTHER): Payer: Medicaid Other | Admitting: Obstetrics and Gynecology

## 2018-01-16 VITALS — BP 110/72 | HR 109 | Wt 168.0 lb

## 2018-01-16 DIAGNOSIS — O98513 Other viral diseases complicating pregnancy, third trimester: Secondary | ICD-10-CM

## 2018-01-16 DIAGNOSIS — B009 Herpesviral infection, unspecified: Secondary | ICD-10-CM

## 2018-01-16 DIAGNOSIS — N949 Unspecified condition associated with female genital organs and menstrual cycle: Secondary | ICD-10-CM

## 2018-01-16 DIAGNOSIS — Z3483 Encounter for supervision of other normal pregnancy, third trimester: Secondary | ICD-10-CM

## 2018-01-16 DIAGNOSIS — O26893 Other specified pregnancy related conditions, third trimester: Secondary | ICD-10-CM

## 2018-01-16 MED ORDER — COMFORT FIT MATERNITY SUPP SM MISC: 1.0000 [IU] | Freq: Every day | 0 refills | Status: DC | PRN

## 2018-01-16 NOTE — Progress Notes (Signed)
   PRENATAL VISIT NOTE  Subjective:  Jaclyn Owens is a 22 y.o. G2P1001 at 6477w1d being seen today for ongoing prenatal care.  She is currently monitored for the following issues for this low-risk pregnancy and has Adjustment disorder with depressed mood; Supervision of other normal pregnancy, antepartum; HSV-2 infection complicating pregnancy; Bilateral calf pain; Threatened preterm labor, third trimester; and Preterm labor in third trimester on their problem list.  Patient reports no bleeding, no cramping, no leaking and occasional contractions.  Contractions: Irregular. Vag. Bleeding: None.  Movement: Present. Denies leaking of fluid.   The following portions of the patient's history were reviewed and updated as appropriate: allergies, current medications, past family history, past medical history, past social history, past surgical history and problem list. Problem list updated.  Objective:   Vitals:   01/16/18 1029  BP: 110/72  Pulse: (!) 109  Weight: 76.2 kg    Fetal Status: Fetal Heart Rate (bpm): 152 Fundal Height: 35 cm Movement: Present  Presentation: Vertex  General:  Alert, oriented and cooperative. Patient is in no acute distress.  Skin: Skin is warm and dry. No rash noted.   Cardiovascular: Normal heart rate noted  Respiratory: Normal respiratory effort, no problems with respiration noted  Abdomen: Soft, gravid, appropriate for gestational age.  Pain/Pressure: Present     Pelvic: Cervical exam performed Dilation: 1.5 Effacement (%): 50 Station: Ballotable  Extremities: Normal range of motion.  Edema: None  Mental Status: Normal mood and affect. Normal behavior. Normal judgment and thought content.   Assessment and Plan:  Pregnancy: G2P1001 at 677w1d  1. Encounter for supervision of other normal pregnancy in third trimester  2. Pelvic pressure in pregnancy, antepartum, third trimester - Elastic Bandages & Supports (COMFORT FIT MATERNITY SUPP SM) MISC; 1 Units by Does  not apply route daily as needed.  Dispense: 1 each; Refill: 0  3. HSV-2 infection complicating pregnancy, third trimester - Taking Valtrex as prescribed since 01/12/18  Preterm labor symptoms and general obstetric precautions including but not limited to vaginal bleeding, contractions, leaking of fluid and fetal movement were reviewed in detail with the patient. Please refer to After Visit Summary for other counseling recommendations.   Future Appointments  Date Time Provider Department Center  01/23/2018 10:10 AM Raelyn Moraawson, Rolitta, CNM [facility] None  01/30/2018  4:10 PM Raelyn Moraawson, Rolitta, CNM [facility] None  02/06/2018  4:10 PM Raelyn Moraawson, Rolitta, CNM [facility] None    Bernerd LimboJamilla R Swanson Farnell, Student-MidWife

## 2018-01-23 ENCOUNTER — Encounter: Payer: Self-pay | Admitting: Obstetrics and Gynecology

## 2018-01-24 ENCOUNTER — Ambulatory Visit (HOSPITAL_COMMUNITY)
Admission: RE | Admit: 2018-01-24 | Discharge: 2018-01-24 | Disposition: A | Discharge status: Home / Self Care | Payer: Medicaid Other | Source: Ambulatory Visit | Attending: Obstetrics and Gynecology | Admitting: Obstetrics and Gynecology

## 2018-01-24 DIAGNOSIS — Z3A37 37 weeks gestation of pregnancy: Secondary | ICD-10-CM | POA: Insufficient documentation

## 2018-01-24 DIAGNOSIS — O09293 Supervision of pregnancy with other poor reproductive or obstetric history, third trimester: Secondary | ICD-10-CM

## 2018-01-24 DIAGNOSIS — Z3483 Encounter for supervision of other normal pregnancy, third trimester: Secondary | ICD-10-CM | POA: Diagnosis not present

## 2018-01-27 ENCOUNTER — Other Ambulatory Visit (HOSPITAL_COMMUNITY): Payer: Self-pay | Admitting: *Deleted

## 2018-01-27 DIAGNOSIS — O368990 Maternal care for other specified fetal problems, unspecified trimester, not applicable or unspecified: Secondary | ICD-10-CM

## 2018-01-29 NOTE — L&D Delivery Note (Signed)
Delivery Note At 12:11 AM a viable and healthy female was delivered via Vaginal, Spontaneous (Presentation: ROA; cephalic  ).  APGAR: 2, 4; weight  .   Placenta status: intact, spontaneous .  Cord: 3 vessel  with the following complications: none. Loose nuchal x1  Anesthesia:  epidural Episiotomy: None Lacerations:  First degree perineal Suture Repair: 3.0 monocryl Est. Blood Loss (mL):   Mom to postpartum.  Baby to Couplet care / Skin to Skin.  Myrene Buddy 02/01/2018, 12:39 AM

## 2018-01-30 ENCOUNTER — Ambulatory Visit (INDEPENDENT_AMBULATORY_CARE_PROVIDER_SITE_OTHER): Payer: Medicaid Other | Admitting: Obstetrics and Gynecology

## 2018-01-30 VITALS — BP 118/71 | HR 114 | Wt 170.4 lb

## 2018-01-30 DIAGNOSIS — Z3480 Encounter for supervision of other normal pregnancy, unspecified trimester: Secondary | ICD-10-CM

## 2018-01-30 DIAGNOSIS — Z3483 Encounter for supervision of other normal pregnancy, third trimester: Secondary | ICD-10-CM

## 2018-01-30 NOTE — Progress Notes (Signed)
   PRENATAL VISIT NOTE  Subjective:  Jaclyn Owens is a 23 y.o. G2P1001 at [redacted]w[redacted]d being seen today for ongoing prenatal care.  She is currently monitored for the following issues for this low-risk pregnancy and has Adjustment disorder with depressed mood; Supervision of other normal pregnancy, antepartum; HSV-2 infection complicating pregnancy; Bilateral calf pain; Threatened preterm labor, third trimester; and Preterm labor in third trimester on their problem list.  Patient reports lost mucus plug.  Contractions: Irregular. Vag. Bleeding: None.  Movement: Present. Denies leaking of fluid. Wants Nexplanon postpartum.  The following portions of the patient's history were reviewed and updated as appropriate: allergies, current medications, past family history, past medical history, past social history, past surgical history and problem list. Problem list updated.  Objective:   Vitals:   01/30/18 1623  BP: 118/71  Pulse: (!) 114  Weight: 77.3 kg    Fetal Status: Fetal Heart Rate (bpm): 140 Fundal Height: 38 cm Movement: Present    *FHR difficult to assess with doppler, had to be audibly counted. General:  Alert, oriented and cooperative. Patient is in no acute distress.  Skin: Skin is warm and dry. No rash noted.   Cardiovascular: Normal heart rate noted  Respiratory: Normal respiratory effort, no problems with respiration noted  Abdomen: Soft, gravid, appropriate for gestational age.  Pain/Pressure: Present     Pelvic: Cervical exam deferred        Extremities: Normal range of motion.  Edema: None  Mental Status: Normal mood and affect. Normal behavior. Normal judgment and thought content.   Assessment and Plan:  Pregnancy: G2P1001 at [redacted]w[redacted]d  1. Supervision of other normal pregnancy, antepartum  Term labor symptoms and general obstetric precautions including but not limited to vaginal bleeding, contractions, leaking of fluid and fetal movement were reviewed in detail with the  patient. Please refer to After Visit Summary for other counseling recommendations.  Return in about 1 week (around 02/06/2018) for ROB.  Future Appointments  Date Time Provider Department Center  01/31/2018  1:00 PM [facility] [place] 1 [facility]  02/06/2018  4:10 PM Raelyn Mora, CNM [facility] None    Bernerd Limbo, Student-MidWife

## 2018-01-31 ENCOUNTER — Ambulatory Visit (HOSPITAL_BASED_OUTPATIENT_CLINIC_OR_DEPARTMENT_OTHER)
Admission: RE | Admit: 2018-01-31 | Discharge: 2018-01-31 | Disposition: A | Discharge status: Home / Self Care | Payer: Medicaid Other | Source: Ambulatory Visit | Attending: Obstetrics and Gynecology | Admitting: Obstetrics and Gynecology

## 2018-01-31 ENCOUNTER — Inpatient Hospital Stay (HOSPITAL_COMMUNITY): Payer: Medicaid Other | Admitting: Anesthesiology

## 2018-01-31 ENCOUNTER — Inpatient Hospital Stay (HOSPITAL_COMMUNITY)
Admission: AD | Admit: 2018-01-31 | Discharge: 2018-02-02 | DRG: 806 | Disposition: A | Discharge status: Home / Self Care | Payer: Medicaid Other | Attending: Obstetrics and Gynecology | Admitting: Obstetrics and Gynecology

## 2018-01-31 ENCOUNTER — Ambulatory Visit (HOSPITAL_COMMUNITY)
Admission: RE | Admit: 2018-01-31 | Discharge: 2018-01-31 | Disposition: A | Discharge status: Home / Self Care | Payer: Medicaid Other | Source: Ambulatory Visit | Attending: Obstetrics and Gynecology | Admitting: Obstetrics and Gynecology

## 2018-01-31 ENCOUNTER — Other Ambulatory Visit: Payer: Self-pay

## 2018-01-31 ENCOUNTER — Inpatient Hospital Stay (HOSPITAL_BASED_OUTPATIENT_CLINIC_OR_DEPARTMENT_OTHER): Payer: Medicaid Other

## 2018-01-31 ENCOUNTER — Encounter (HOSPITAL_COMMUNITY): Payer: Self-pay | Admitting: *Deleted

## 2018-01-31 ENCOUNTER — Other Ambulatory Visit (HOSPITAL_COMMUNITY): Payer: Self-pay | Admitting: Obstetrics and Gynecology

## 2018-01-31 ENCOUNTER — Encounter (HOSPITAL_COMMUNITY): Payer: Self-pay

## 2018-01-31 DIAGNOSIS — O368990 Maternal care for other specified fetal problems, unspecified trimester, not applicable or unspecified: Secondary | ICD-10-CM | POA: Diagnosis present

## 2018-01-31 DIAGNOSIS — Z3A00 Weeks of gestation of pregnancy not specified: Secondary | ICD-10-CM | POA: Diagnosis not present

## 2018-01-31 DIAGNOSIS — O99344 Other mental disorders complicating childbirth: Secondary | ICD-10-CM | POA: Diagnosis present

## 2018-01-31 DIAGNOSIS — A6000 Herpesviral infection of urogenital system, unspecified: Secondary | ICD-10-CM | POA: Diagnosis present

## 2018-01-31 DIAGNOSIS — O98519 Other viral diseases complicating pregnancy, unspecified trimester: Secondary | ICD-10-CM

## 2018-01-31 DIAGNOSIS — M79662 Pain in left lower leg: Secondary | ICD-10-CM | POA: Insufficient documentation

## 2018-01-31 DIAGNOSIS — Z3A38 38 weeks gestation of pregnancy: Secondary | ICD-10-CM | POA: Diagnosis not present

## 2018-01-31 DIAGNOSIS — Z3490 Encounter for supervision of normal pregnancy, unspecified, unspecified trimester: Secondary | ICD-10-CM

## 2018-01-31 DIAGNOSIS — O368930 Maternal care for other specified fetal problems, third trimester, not applicable or unspecified: Secondary | ICD-10-CM

## 2018-01-31 DIAGNOSIS — A491 Streptococcal infection, unspecified site: Secondary | ICD-10-CM | POA: Diagnosis present

## 2018-01-31 DIAGNOSIS — Z87891 Personal history of nicotine dependence: Secondary | ICD-10-CM | POA: Diagnosis not present

## 2018-01-31 DIAGNOSIS — O99824 Streptococcus B carrier state complicating childbirth: Secondary | ICD-10-CM | POA: Diagnosis present

## 2018-01-31 DIAGNOSIS — M79661 Pain in right lower leg: Secondary | ICD-10-CM

## 2018-01-31 DIAGNOSIS — O358XX0 Maternal care for other (suspected) fetal abnormality and damage, not applicable or unspecified: Secondary | ICD-10-CM | POA: Diagnosis present

## 2018-01-31 DIAGNOSIS — O9832 Other infections with a predominantly sexual mode of transmission complicating childbirth: Secondary | ICD-10-CM | POA: Diagnosis present

## 2018-01-31 DIAGNOSIS — Z3480 Encounter for supervision of other normal pregnancy, unspecified trimester: Secondary | ICD-10-CM

## 2018-01-31 DIAGNOSIS — B009 Herpesviral infection, unspecified: Secondary | ICD-10-CM | POA: Diagnosis present

## 2018-01-31 DIAGNOSIS — F4321 Adjustment disorder with depressed mood: Secondary | ICD-10-CM | POA: Diagnosis present

## 2018-01-31 DIAGNOSIS — O98313 Other infections with a predominantly sexual mode of transmission complicating pregnancy, third trimester: Secondary | ICD-10-CM | POA: Diagnosis not present

## 2018-01-31 LAB — CBC
HCT: 37.5 % (ref 36.0–46.0)
Hemoglobin: 12.3 g/dL (ref 12.0–15.0)
MCH: 28.5 pg (ref 26.0–34.0)
MCHC: 32.8 g/dL (ref 30.0–36.0)
MCV: 87 fL (ref 80.0–100.0)
PLATELETS: 169 10*3/uL (ref 150–400)
RBC: 4.31 MIL/uL (ref 3.87–5.11)
RDW: 13.9 % (ref 11.5–15.5)
WBC: 8.1 10*3/uL (ref 4.0–10.5)
nRBC: 0 % (ref 0.0–0.2)

## 2018-01-31 LAB — TYPE AND SCREEN
ABO/RH(D): B POS
Antibody Screen: NEGATIVE

## 2018-01-31 MED ORDER — ACETAMINOPHEN 325 MG PO TABS: 650.0000 mg | ORAL_TABLET | ORAL | Status: DC | PRN

## 2018-01-31 MED ORDER — SOD CITRATE-CITRIC ACID 500-334 MG/5ML PO SOLN
30.0000 mL | ORAL | Status: DC | PRN
  Filled 2018-01-31: qty 15

## 2018-01-31 MED ORDER — LIDOCAINE-EPINEPHRINE (PF) 2 %-1:200000 IJ SOLN
INTRAMUSCULAR | Status: DC | PRN
  Administered 2018-01-31: 4 mL via EPIDURAL
  Administered 2018-01-31: 3 mL via EPIDURAL

## 2018-01-31 MED ORDER — LIDOCAINE HCL (PF) 1 % IJ SOLN
30.0000 mL | INTRAMUSCULAR | Status: DC | PRN
  Filled 2018-01-31: qty 30

## 2018-01-31 MED ORDER — LACTATED RINGERS IV SOLN: 500.0000 mL | INTRAVENOUS | Status: DC | PRN

## 2018-01-31 MED ORDER — FENTANYL CITRATE (PF) 100 MCG/2ML IJ SOLN
100.0000 ug | INTRAMUSCULAR | Status: DC | PRN
  Administered 2018-01-31: 100 ug via INTRAVENOUS
  Filled 2018-01-31: qty 2

## 2018-01-31 MED ORDER — OXYCODONE-ACETAMINOPHEN 5-325 MG PO TABS
1.0000 | ORAL_TABLET | ORAL | Status: DC | PRN
  Administered 2018-02-01: 1 via ORAL
  Filled 2018-01-31: qty 1

## 2018-01-31 MED ORDER — ONDANSETRON HCL 4 MG/2ML IJ SOLN
4.0000 mg | Freq: Four times a day (QID) | INTRAMUSCULAR | Status: DC | PRN
  Administered 2018-01-31: 4 mg via INTRAVENOUS
  Filled 2018-01-31: qty 2

## 2018-01-31 MED ORDER — OXYCODONE-ACETAMINOPHEN 5-325 MG PO TABS: 2.0000 | ORAL_TABLET | ORAL | Status: DC | PRN

## 2018-01-31 MED ORDER — EPHEDRINE 5 MG/ML INJ
10.0000 mg | INTRAVENOUS | Status: DC | PRN
  Filled 2018-01-31: qty 2

## 2018-01-31 MED ORDER — LACTATED RINGERS IV SOLN
500.0000 mL | Freq: Once | INTRAVENOUS | Status: AC
  Administered 2018-01-31: 500 mL via INTRAVENOUS

## 2018-01-31 MED ORDER — PHENYLEPHRINE 40 MCG/ML (10ML) SYRINGE FOR IV PUSH (FOR BLOOD PRESSURE SUPPORT)
80.0000 ug | PREFILLED_SYRINGE | INTRAVENOUS | Status: DC | PRN
  Filled 2018-01-31: qty 10

## 2018-01-31 MED ORDER — OXYTOCIN BOLUS FROM INFUSION
500.0000 mL | Freq: Once | INTRAVENOUS | Status: AC
  Administered 2018-02-01: 500 mL via INTRAVENOUS

## 2018-01-31 MED ORDER — SODIUM CHLORIDE 0.9 % IV SOLN
5.0000 10*6.[IU] | Freq: Once | INTRAVENOUS | Status: AC
  Administered 2018-01-31: 5 10*6.[IU] via INTRAVENOUS
  Filled 2018-01-31: qty 5

## 2018-01-31 MED ORDER — FENTANYL 2.5 MCG/ML BUPIVACAINE 1/10 % EPIDURAL INFUSION (WH - ANES)
14.0000 mL/h | INTRAMUSCULAR | Status: DC | PRN
  Administered 2018-01-31: 14 mL/h via EPIDURAL
  Filled 2018-01-31: qty 100

## 2018-01-31 MED ORDER — PENICILLIN G 3 MILLION UNITS IVPB - SIMPLE MED
3.0000 10*6.[IU] | INTRAVENOUS | Status: DC
  Administered 2018-01-31: 3 10*6.[IU] via INTRAVENOUS
  Filled 2018-01-31 (×4): qty 100

## 2018-01-31 MED ORDER — OXYTOCIN 40 UNITS IN LACTATED RINGERS INFUSION - SIMPLE MED
1.0000 m[IU]/min | INTRAVENOUS | Status: DC
  Administered 2018-01-31: 2 m[IU]/min via INTRAVENOUS

## 2018-01-31 MED ORDER — OXYTOCIN 40 UNITS IN LACTATED RINGERS INFUSION - SIMPLE MED
2.5000 [IU]/h | INTRAVENOUS | Status: DC
  Filled 2018-01-31: qty 1000

## 2018-01-31 MED ORDER — TERBUTALINE SULFATE 1 MG/ML IJ SOLN
0.2500 mg | Freq: Once | INTRAMUSCULAR | Status: DC | PRN
  Filled 2018-01-31: qty 1

## 2018-01-31 MED ORDER — DIPHENHYDRAMINE HCL 50 MG/ML IJ SOLN: 12.5000 mg | INTRAMUSCULAR | Status: DC | PRN

## 2018-01-31 MED ORDER — PHENYLEPHRINE 40 MCG/ML (10ML) SYRINGE FOR IV PUSH (FOR BLOOD PRESSURE SUPPORT)
80.0000 ug | PREFILLED_SYRINGE | INTRAVENOUS | Status: DC | PRN
  Filled 2018-01-31 (×2): qty 10

## 2018-01-31 MED ORDER — LACTATED RINGERS IV SOLN
INTRAVENOUS | Status: DC
  Administered 2018-01-31: 16:00:00 via INTRAVENOUS

## 2018-01-31 NOTE — Anesthesia Procedure Notes (Signed)
Epidural Patient location during procedure: OB Start time: 01/31/2018 8:55 PM End time: 01/31/2018 9:10 PM  Staffing Anesthesiologist: Elmer Picker, MD Performed: anesthesiologist   Preanesthetic Checklist Completed: patient identified, pre-op evaluation, timeout performed, IV checked, risks and benefits discussed and monitors and equipment checked  Epidural Patient position: sitting Prep: site prepped and draped and DuraPrep Patient monitoring: continuous pulse ox, blood pressure, heart rate and cardiac monitor Approach: midline Location: L3-L4 Injection technique: LOR air  Needle:  Needle type: Tuohy  Needle gauge: 17 G Needle length: 9 cm Needle insertion depth: 5 cm Catheter type: closed end flexible Catheter size: 19 Gauge Catheter at skin depth: 10 cm Test dose: negative  Assessment Sensory level: T8 Events: blood not aspirated, injection not painful, no injection resistance, negative IV test and no paresthesia  Additional Notes Patient identified. Risks/Benefits/Options discussed with patient including but not limited to bleeding, infection, nerve damage, paralysis, failed block, incomplete pain control, headache, blood pressure changes, nausea, vomiting, reactions to medication both or allergic, itching and postpartum back pain. Confirmed with bedside nurse the patient's most recent platelet count. Confirmed with patient that they are not currently taking any anticoagulation, have any bleeding history or any family history of bleeding disorders. Patient expressed understanding and wished to proceed. All questions were answered. Sterile technique was used throughout the entire procedure. Please see nursing notes for vital signs. Test dose was given through epidural catheter and negative prior to continuing to dose epidural or start infusion. Warning signs of high block given to the patient including shortness of breath, tingling/numbness in hands, complete motor block, or  any concerning symptoms with instructions to call for help. Patient was given instructions on fall risk and not to get out of bed. All questions and concerns addressed with instructions to call with any issues or inadequate analgesia.  Reason for block:procedure for pain

## 2018-01-31 NOTE — H&P (Addendum)
Jaclyn Owens is a 23 y.o. female G56P1101 @ 38.2wks  presenting for IOL due to dx of fetal pericardial effusion last week followed by U/S showing the same today as well as episodes of bradycardia on EFM. Her preg has been followed by the [facility] and has been remarkable for 1) recent dx of pericardial effusion 2) hx HSV- no recent outbreaks 3) depression 4) GBS pos  OB History    Gravida  2   Para  1   Term  1   Preterm  0   AB  0   Living  1     SAB  0   TAB  0   Ectopic  0   Multiple  0   Live Births  0          Past Medical History:  Diagnosis Date  . Asthma   . HSV (herpes simplex virus) infection    Past Surgical History:  Procedure Laterality Date  . ARM WOUND REPAIR / CLOSURE     Family History: family history includes Asthma in her mother; Cancer in her maternal grandmother; Diabetes in her maternal aunt. Social History:  reports that she has quit smoking. Her smoking use included cigarettes and cigars. She has never used smokeless tobacco. She reports previous alcohol use. She reports that she does not use drugs.     Maternal Diabetes: No Genetic Screening: Normal Maternal Ultrasounds/Referrals: Abnormal:  Findings:   Other:pericardial effusion Fetal Ultrasounds or other Referrals:  Referred to Materal Fetal Medicine  Maternal Substance Abuse:  No Significant Maternal Medications:  Meds include: Other: Valtrex Significant Maternal Lab Results:  Lab values include: Group B Strep positive Other Comments:  None  ROS History Dilation: 4 Effacement (%): 80 Station: -2 Exam by:: C Lendell Caprice RN Blood pressure (!) 107/57, pulse 94, temperature 98.1 F (36.7 C), temperature source Oral, resp. rate 14, height 5\' 6"  (1.676 m), weight 76.4 kg, last menstrual period 05/08/2017, SpO2 100 %, unknown if currently breastfeeding. Exam Physical Exam  Constitutional: She is oriented to person, place, and time. She appears well-developed.  HENT:  Head:  Normocephalic.  Neck: Normal range of motion.  Cardiovascular: Normal rate.  Respiratory: Effort normal.  GI:  EFM 120-130s, +accels, no decels- occ periods of fetal bradycardia to 70s but faster rate >100 is audible Ctx irreg  Musculoskeletal: Normal range of motion.  Neurological: She is alert and oriented to person, place, and time.  Skin: Skin is warm and dry.  Psychiatric: She has a normal mood and affect. Her behavior is normal. Thought content normal.    Prenatal labs: ABO, Rh: --/--/B POS (01/03 1528) Antibody: NEG (01/03 1528) Rubella: 12.30 (08/01 1433) RPR: Non Reactive (12/13 1506)  HBsAg: Negative (08/01 1433)  HIV: Non Reactive (10/24 0828)  GBS:   positive 01/10/18  Assessment/Plan: IUP@ 38.2wks Pericardial effusion Cx semifavorable GBS pos  Admit to [employer] cx ripening with foley most likely, followed by [facility] FHR closely PCN for GBS ppx Anticipate SVD- peds at delivery   Arabella Merles CNM 01/31/2018, 10:12 PM

## 2018-01-31 NOTE — Progress Notes (Signed)
Jaclyn Owens is a 23 y.o. G2P1001 at [redacted]w[redacted]d by LMP admitted for induction of labor due to pericardial effusion per MFM.  Subjective: Patient experiencing contractions as sharp pain in her upper abdomen and back. Baby tracing better with mom resting more supine, baby still having HR in 130s with runs in the 70s.   Objective: BP 111/69   Pulse 93   Resp (P) 18   LMP 05/08/2017  No intake/output data recorded. No intake/output data recorded.  FHT:  FHR: 135 bpm, variability: moderate,  accelerations:  Present,  decelerations:  Absent UC:   regular, every 5 minutes SVE:   Dilation: 2 Effacement (%): 50 Station: -3 Exam by:: Tyler Aas, SNM  Labs: Lab Results  Component Value Date   WBC 8.1 01/31/2018   HGB 12.3 01/31/2018   HCT 37.5 01/31/2018   MCV 87.0 01/31/2018   PLT 169 01/31/2018    Assessment / Plan: IOL for pericardial effusion  MD aware of heart rate tracing in 130s with arrhythmias into the 70s Light labor diet Foley bulb after patient eats dinner  Labor: Latent labor, will proceed with induction Preeclampsia:  no signs or symptoms of toxicity Fetal Wellbeing:  Category I Pain Control:  Labor support without medications I/D:  n/a Anticipated MOD:  NSVD  Bernerd Limbo, SNM 01/31/2018, 5:34 PM

## 2018-01-31 NOTE — Progress Notes (Signed)
FHR audible in 135 very irregular and unable to trace with fetal monitor.  No decreases or increases noted in FHR

## 2018-01-31 NOTE — Anesthesia Preprocedure Evaluation (Signed)
Anesthesia Evaluation  Patient identified by MRN, date of birth, ID band Patient awake    Reviewed: Allergy & Precautions, NPO status , Patient's Chart, lab work & pertinent test results  Airway Mallampati: I  TM Distance: >3 FB Neck ROM: Full    Dental no notable dental hx.    Pulmonary asthma , former smoker,    Pulmonary exam normal breath sounds clear to auscultation       Cardiovascular negative cardio ROS Normal cardiovascular exam Rhythm:Regular Rate:Normal     Neuro/Psych negative neurological ROS  negative psych ROS   GI/Hepatic negative GI ROS, Neg liver ROS,   Endo/Other  negative endocrine ROS  Renal/GU negative Renal ROS  negative genitourinary   Musculoskeletal negative musculoskeletal ROS (+)   Abdominal   Peds  Hematology negative hematology ROS (+)   Anesthesia Other Findings   Reproductive/Obstetrics negative OB ROS                             Anesthesia Physical Anesthesia Plan  ASA: II  Anesthesia Plan: Epidural   Post-op Pain Management:    Induction:   PONV Risk Score and Plan: Treatment may vary due to age or medical condition  Airway Management Planned: Natural Airway  Additional Equipment:   Intra-op Plan:   Post-operative Plan:   Informed Consent: I have reviewed the patients History and Physical, chart, labs and discussed the procedure including the risks, benefits and alternatives for the proposed anesthesia with the patient or authorized representative who has indicated his/her understanding and acceptance.     Plan Discussed with: Anesthesiologist  Anesthesia Plan Comments: (Patient identified. Risks, benefits, options discussed with patient including but not limited to bleeding, infection, nerve damage, paralysis, failed block, incomplete pain control, headache, blood pressure changes, nausea, vomiting, reactions to medication, itching, and  post partum back pain. Confirmed with bedside nurse the patient's most recent platelet count. Confirmed with the patient that they are not taking any anticoagulation, have any bleeding history or any family history of bleeding disorders. Patient expressed understanding and wishes to proceed. All questions were answered. )        Anesthesia Quick Evaluation

## 2018-01-31 NOTE — Progress Notes (Signed)
FHT continuing to pick up in 70's with absent variability on monitor.  FHR counted at 112 with volume turned up on monitored.  Monitors off at 1852 [facility]  Dr Emelda FearFerguson in department.

## 2018-01-31 NOTE — Progress Notes (Signed)
FHR audible in 130's with no increases or decreases noted.  FHR irregular and not tracing on fetal monitor despite audible rhythm. Dr Earlene PlaterWallace [facility]  Patient repositioned multiple times to try to get better reading

## 2018-01-31 NOTE — ED Notes (Signed)
Pt to room 171, report given to A. Sowder, Charity fundraiser.

## 2018-01-31 NOTE — Progress Notes (Signed)
FHR counted at 104 with tracing reading at 70.  Ferguson at bedside

## 2018-01-31 NOTE — Procedures (Signed)
Jaclyn Owens 04-12-1995 [redacted]w[redacted]d  Fetus A Non-Stress Test Interpretation for 01/31/18  Indication: pericardial effusion  Fetal Heart Rate A Mode: External Baseline Rate (A): (FHR 135, down to upper 60's for periods of 2-83min.) Accelerations: None Decelerations: (difficult to determin decel vs arrythmia)  Uterine Activity Mode: Toco Contraction Frequency (min): occ UC noted Contraction Duration (sec): 100-120 Contraction Quality: Mild Resting Tone Palpated: Relaxed Resting Time: Adequate  Interpretation (Fetal Testing) Nonstress Test Interpretation: Non-reactive Comments: FHR tracing reviewed by Dr. Judeth Cornfield.  Pt to L & D.

## 2018-01-31 NOTE — Progress Notes (Signed)
Patient ID: Jaclyn Owens, female   DOB: May 22, 1995, 23 y.o.   MRN: [id number]  Having some spont cramping  BP 111/69, P 93 FHR audible 130s, traces at 70 at times Ctx irreg q 2-5 mins Cx post/2cm/50%/soft/vtx -2  IUP@term  Pericardial effusion with fetal arrythmia Cx semi-favorable GBS pos  Cervical foley inserted without difficulty Plan on Pit when it comes out PCN for GBS ppx  Arabella Merles CNM 01/31/2018

## 2018-01-31 NOTE — Progress Notes (Signed)
Jaclyn Owens is a 23 y.o. G2P1001 at [redacted]w[redacted]d by admitted for induction of labor due to pericardial effusion with heart arrhythmia.  Subjective: Baby tracing in the 70s, but audibly heard and counted as being in the 100s-120s by Dr. Emelda Fear at bedside.   Objective: BP 111/69   Pulse 93   Resp 18   LMP 05/08/2017  No intake/output data recorded.  FHT: Audibly counted as 100-120 with some minimal to moderate variability, and 3-4 heart sounds heard per beat. Only tracing every 3rd-4th beat.  UC:   regular, every 5-7 minutes SVE:   Dilation: 2 Effacement (%): 50 Station: -3 Exam by:: Tyler Aas, SNM  Labs: Lab Results  Component Value Date   WBC 8.1 01/31/2018   HGB 12.3 01/31/2018   HCT 37.5 01/31/2018   MCV 87.0 01/31/2018   PLT 169 01/31/2018    Assessment / Plan: IOL for pericardial effusion BPP ordered, MD aware and consulting MFM to discuss plan  Labor: Latent labor Preeclampsia:  no signs or symptoms of toxicity Fetal Wellbeing:  BPP ordered to assess fetal wellbeing Pain Control:  Labor support without medications I/D:  n/a Anticipated MOD:  MD consulting MFM, cautiously anticipate NSVD  Leda Roys 01/31/2018, 6:50 PM

## 2018-01-31 NOTE — ED Notes (Signed)
Report given to Fleet Contras, Charity fundraiser.  Pt to MAU.

## 2018-02-01 ENCOUNTER — Encounter (HOSPITAL_COMMUNITY): Payer: Self-pay | Admitting: *Deleted

## 2018-02-01 DIAGNOSIS — A491 Streptococcal infection, unspecified site: Secondary | ICD-10-CM | POA: Diagnosis present

## 2018-02-01 LAB — RPR: RPR Ser Ql: NONREACTIVE

## 2018-02-01 MED ORDER — DIBUCAINE 1 % RE OINT
1.0000 "application " | TOPICAL_OINTMENT | RECTAL | Status: DC | PRN
  Administered 2018-02-01: 1 via RECTAL
  Filled 2018-02-01: qty 28

## 2018-02-01 MED ORDER — PRENATAL MULTIVITAMIN CH
1.0000 | ORAL_TABLET | Freq: Every day | ORAL | Status: DC
  Administered 2018-02-01 – 2018-02-02 (×2): 1 via ORAL
  Filled 2018-02-01 (×2): qty 1

## 2018-02-01 MED ORDER — OXYCODONE-ACETAMINOPHEN 5-325 MG PO TABS
1.0000 | ORAL_TABLET | Freq: Four times a day (QID) | ORAL | Status: DC | PRN
  Administered 2018-02-01 – 2018-02-02 (×4): 1 via ORAL
  Filled 2018-02-01 (×4): qty 1

## 2018-02-01 MED ORDER — ONDANSETRON HCL 4 MG/2ML IJ SOLN: 4.0000 mg | INTRAMUSCULAR | Status: DC | PRN

## 2018-02-01 MED ORDER — ONDANSETRON HCL 4 MG PO TABS: 4.0000 mg | ORAL_TABLET | ORAL | Status: DC | PRN

## 2018-02-01 MED ORDER — TETANUS-DIPHTH-ACELL PERTUSSIS 5-2.5-18.5 LF-MCG/0.5 IM SUSP: 0.5000 mL | Freq: Once | INTRAMUSCULAR | Status: DC

## 2018-02-01 MED ORDER — ACETAMINOPHEN 325 MG PO TABS: 650.0000 mg | ORAL_TABLET | ORAL | Status: DC | PRN

## 2018-02-01 MED ORDER — IBUPROFEN 600 MG PO TABS
600.0000 mg | ORAL_TABLET | Freq: Four times a day (QID) | ORAL | Status: DC
  Administered 2018-02-01 – 2018-02-02 (×7): 600 mg via ORAL
  Filled 2018-02-01 (×7): qty 1

## 2018-02-01 MED ORDER — NALOXONE HCL 0.4 MG/ML IJ SOLN
INTRAMUSCULAR | Status: AC
  Filled 2018-02-01: qty 1

## 2018-02-01 MED ORDER — VITAMIN K1 1 MG/0.5ML IJ SOLN
INTRAMUSCULAR | Status: AC
  Filled 2018-02-01: qty 0.5

## 2018-02-01 MED ORDER — WITCH HAZEL-GLYCERIN EX PADS
1.0000 "application " | MEDICATED_PAD | CUTANEOUS | Status: DC | PRN
  Administered 2018-02-01: 1 via TOPICAL

## 2018-02-01 MED ORDER — BENZOCAINE-MENTHOL 20-0.5 % EX AERO
1.0000 "application " | INHALATION_SPRAY | CUTANEOUS | Status: DC | PRN
  Administered 2018-02-01: 1 via TOPICAL
  Filled 2018-02-01: qty 56

## 2018-02-01 MED ORDER — COCONUT OIL OIL: 1.0000 "application " | TOPICAL_OIL | Status: DC | PRN

## 2018-02-01 MED ORDER — ZOLPIDEM TARTRATE 5 MG PO TABS: 5.0000 mg | ORAL_TABLET | Freq: Every evening | ORAL | Status: DC | PRN

## 2018-02-01 MED ORDER — SENNOSIDES-DOCUSATE SODIUM 8.6-50 MG PO TABS
2.0000 | ORAL_TABLET | ORAL | Status: DC
  Administered 2018-02-01: 2 via ORAL
  Filled 2018-02-01: qty 2

## 2018-02-01 MED ORDER — DIPHENHYDRAMINE HCL 25 MG PO CAPS: 25.0000 mg | ORAL_CAPSULE | Freq: Four times a day (QID) | ORAL | Status: DC | PRN

## 2018-02-01 MED ORDER — SIMETHICONE 80 MG PO CHEW: 80.0000 mg | CHEWABLE_TABLET | ORAL | Status: DC | PRN

## 2018-02-01 NOTE — Anesthesia Postprocedure Evaluation (Signed)
Anesthesia Post Note  Patient: Jaclyn Owens  Procedure(s) Performed: AN AD HOC LABOR EPIDURAL     Patient location during evaluation: Mother Baby Anesthesia Type: Epidural Level of consciousness: awake and alert Pain management: pain level controlled Vital Signs Assessment: post-procedure vital signs reviewed and stable Respiratory status: spontaneous breathing, nonlabored ventilation and respiratory function stable Cardiovascular status: stable Postop Assessment: no headache, no backache, epidural receding, no apparent nausea or vomiting, patient able to bend at knees, able to ambulate and adequate PO intake Anesthetic complications: no    Last Vitals:  Vitals:   02/01/18 0300 02/01/18 0405  BP: 109/73 114/80  Pulse: (!) 113 (!) 111  Resp: 17 18  Temp: 37.6 C   SpO2:      Last Pain:  Vitals:   02/01/18 0510  TempSrc:   PainSc: 7    Pain Goal: Patients Stated Pain Goal: 5 (02/01/18 0217)               Laban Emperor

## 2018-02-01 NOTE — Lactation Note (Signed)
This note was copied from a baby's chart. Lactation Consultation Note  Patient Name: Jaclyn Owens EBXID'H Date: 02/01/2018 Reason for consult: Initial assessment;Early term 37-38.6wks P2, 4 hour female infant. Per mom, she given infant formula prior to [facility] entering room. Per mom, breastfeeding hurts and was very painful  When she latched son in L&D, she experienced the  same thing with her older son, she stopped breastfeeding after 3 weeks with him and got engorgment. As LC was talking with mom, she was willing to  hand express and  Infant was given 5 ml of EBM on spoon. Mom decided to breast and bottle feed instead of completely switching to formula as she previous told her Nurse. Mom willing to use breast pump and plans to pump every 3 hours to help with breast milk induction and stimulation. Mom not want to latch infant to breast at this time due to being very tired. Mom is open to trying to latch infant to breast latter today. LC discussed I & O. Reviewed Baby & Me book's Breastfeeding Basics.  Mom shown how to use DEBP & how to disassemble, clean, & reassemble parts. Mom knows to call Nurse or LC if she has any questions, concerns or needs assistance with latching infant to breast.  Mom made aware of O/P services, breastfeeding support groups, community resources, and our phone # for post-discharge questions.  Maternal Data Formula Feeding for Exclusion: No Has patient been taught Hand Expression?: Yes(Mom hand expressed 5 ml of colostrum that was spoon feed to infant. ) Does the patient have breastfeeding experience prior to this delivery?: Yes  Feeding Feeding Type: Bottle Fed - Formula  LATCH Score Latch: Repeated attempts needed to sustain latch, nipple held in mouth throughout feeding, stimulation needed to elicit sucking reflex.  Audible Swallowing: A few with stimulation  Type of Nipple: Everted at rest and after stimulation  Comfort (Breast/Nipple): Soft /  non-tender  Hold (Positioning): Assistance needed to correctly position infant at breast and maintain latch.  LATCH Score: 7  Interventions Interventions: Breast feeding basics reviewed;Hand pump;DEBP  Lactation Tools Discussed/Used WIC Program: Yes Pump Review: Setup, frequency, and cleaning;Milk Storage Initiated by:: Jaclyn Owens, IBCLC Date initiated:: 02/01/18   Consult Status Consult Status: Follow-up Date: 02/01/18 Follow-up type: In-patient    Jaclyn Owens 02/01/2018, 4:29 AM

## 2018-02-01 NOTE — Discharge Summary (Addendum)
Postpartum Discharge Summary     Patient Name: Jaclyn Owens DOB: 16-Jul-1995 MRN: [id number]  Date of admission: 01/31/2018 Delivering Provider: Cam Hai D   Date of discharge: 02/02/2018  Admitting diagnosis: LABOR Intrauterine pregnancy: [redacted]w[redacted]d     Secondary diagnosis:  Active Problems:   Adjustment disorder with depressed mood   Supervision of other normal pregnancy, antepartum   HSV-2 infection complicating pregnancy   Preterm labor in third trimester   Fetal pericardial effusion affecting management of mother   Group B streptococcal infection  Additional problems: none     Discharge diagnosis: IOL for fetal pericardial effusion                                                                                                Post partum procedures:none  Augmentation: Pitocin and Foley Balloon  Complications: None  Hospital course:  Induction of Labor With Vaginal Delivery   23 y.o. yo G2P1001 at [redacted]w[redacted]d was admitted to the hospital 01/31/2018 for induction of labor.  Indication for induction: fetal pericardial effusion.  Patient had an uncomplicated labor course as follows: Patient presented at 4cm. Started on pitocin, progressed well until approximately 8 hours after admission. Patient was complete at that time and had a NSVD without complication. Patient's post-delivery course was complicated somewhat by pain. Oxycodone 5mg  added on with great relief. This was stopped on PPD#2. Patient discharged home with no complications on 02/02/2017.  Membrane Rupture Time/Date: 10:30 PM ,01/31/2018   Intrapartum Procedures: Episiotomy: None [1]                                         Lacerations:  1st degree [2];Perineal [11]  Patient had delivery of a Viable infant.  Information for the patient's newborn:  Mckinley, Coln [[id number]]  Delivery Method: Vaginal, Spontaneous(Filed from Delivery Summary)   02/01/2018  Details of delivery can be found in separate delivery note.  Patient  had a routine postpartum course. Patient is discharged home 02/02/18.  Magnesium Sulfate recieved: No BMZ received: No  Physical exam  Vitals:   02/01/18 1243 02/01/18 1658 02/01/18 2208 02/02/18 0536  BP: 95/60 108/79 105/70 114/66  Pulse:  89 79 75  Resp: 18 18  16   Temp: 98.2 F (36.8 C)  97.8 F (36.6 C) 98.4 F (36.9 C)  TempSrc: Axillary  Oral Oral  SpO2:  100% 100%   Weight:      Height:       General: alert, cooperative and no distress Lochia: appropriate Uterine Fundus: firm Incision: N/A DVT Evaluation: No evidence of DVT seen on physical exam. Negative Homan's sign. No cords or calf tenderness. Labs: Lab Results  Component Value Date   WBC 8.1 01/31/2018   HGB 12.3 01/31/2018   HCT 37.5 01/31/2018   MCV 87.0 01/31/2018   PLT 169 01/31/2018   CMP Latest Ref Rng & Units 02/26/2017  Glucose 65 - 99 mg/dL 98  BUN 6 - 20 mg/dL 13  Creatinine 5.28 - 4.13  mg/dL 1.610.83  Sodium 096135 - 045145 mmol/L 140  Potassium 3.5 - 5.1 mmol/L 4.2  Chloride 101 - 111 mmol/L 106  CO2 22 - 32 mmol/L 26  Calcium 8.9 - 10.3 mg/dL 9.1  Total Protein 6.5 - 8.1 g/dL -  Total Bilirubin 0.3 - 1.2 mg/dL -  Alkaline Phos 38 - 409126 U/L -  AST 15 - 41 U/L -  ALT 14 - 54 U/L -    Discharge instruction: per After Visit Summary and "Baby and Me Booklet".  After visit meds:  Allergies as of 02/02/2018      Reactions   Bee Venom Anaphylaxis      Medication List    STOP taking these medications   acetaminophen 325 MG tablet Commonly known as:  TYLENOL   COMFORT FIT MATERNITY SUPP SM Misc   PNV PO   valACYclovir 1000 MG tablet Commonly known as:  VALTREX     TAKE these medications   ibuprofen 600 MG tablet Commonly known as:  ADVIL,MOTRIN Take 1 tablet (600 mg total) by mouth every 6 (six) hours.      Diet: routine diet Activity: Advance as tolerated. Pelvic rest for 6 weeks.   Outpatient follow up:4 weeks Follow up Appt: Future Appointments  Date Time Provider Department  Center  02/06/2018  4:10 PM Raelyn Moraawson, Rolitta, CNM [facility] None   Follow up Visit: Please schedule this patient for Postpartum visit in: 4 weeks with the following provider: Any provider For C/S patients schedule nurse incision check in weeks 2 weeks: no High risk pregnancy complicated by: fetal pericardial effusion  Delivery mode:  SVD Anticipated Birth Control:  Nexplanon PP Procedures needed: none  Schedule Integrated BH visit: no   Newborn Data: Live born female  Birth Weight:   APGAR: 2, 4, 9  Newborn Delivery   Birth date/time:  02/01/2018 00:11:00 Delivery type:  Vaginal, Spontaneous     Baby Feeding: Breast Disposition:home with mother   02/02/2018 Myrene BuddyJacob Fletcher, MD  Attestation: I have seen this patient and agree with the resident's documentation. I have examined them separately, and we have discussed the plan of care.  Cristal DeerLaurel S. Earlene PlaterWallace, DO OB/GYN Fellow

## 2018-02-01 NOTE — Consult Note (Signed)
Neonatology Note:   Attendance at Delivery:    I was asked by K. Clelia Croft for Dr. Emelda Fear to attend this NSVD at 38 3/7 weeks due to minimal pericardial effusion seen on fetal ultrasound (most recent study 12/27). The mother is a G2P1 B pos, GBS positive with a history of depression and HSV, without recent outbreaks and on Valtrex suppression. ROM < 1 hour before delivery, fluid clear. Mother got Pen G 4 hours prior to delivery and was afebrile. CAN X 1 loosely. Infant floppy, blue, and apneic at birth, so delayed cord clamping was stopped at about 20 seconds when he did not respond to bulb suctioning and stimulation. We quickly bulb suctioned, then applied PPV. HR was 60 at 1 minute and rising, and was normal within a few more seconds, and color improved with PPV, but baby's respiratory effort remained poor and insufficient to maintain normal HR. I obtained additional history that mother also got 1 dose of IV Fentanyl 4 hours before delivery; no history of drug use, so Narcan 1 ml was administered to the right anterior thigh at 6 minutes of life. The baby began to cry within 30 seconds and continued to breathe regularly after that. We gave BBO2 until O2 saturations were above 90% consistently and then we observed the baby for several minutes on pulse oximetry. He maintained O2 sats of at least 93% in room air and had no distress. Cap refill good, although infant is somewhat pale. Muscle tone improved rapidly once Narcan was given. Ap 2/4 (we were maintaining normal HR and O2 sats with PPV) and 9 at 10 minutes. Lungs clear to ausc in DR, cardiac exam normal, without rhythm disturbance and easily auscultated. Infant is able to remain with his mother for skin to skin time under nursing supervision. Transferred to the care of Pediatrician.   Doretha Sou, MD

## 2018-02-01 NOTE — Lactation Note (Signed)
This note was copied from a baby's chart. Lactation Consultation Note  Patient Name: Jaclyn Owens JOACZ'Y Date: 02/01/2018 Reason for consult: Follow-up assessment Mom eating dinner on arrival.  Mom reports she thinks she would like to pump.  But maybe later.  Urged mom to call lactation as needed.  Left name and number white board.  Maternal Data    Feeding Feeding Type: Formula  LATCH Score                   Interventions Interventions: Breast feeding basics reviewed  Lactation Tools Discussed/Used     Consult Status Consult Status: Follow-up Date: 02/02/18 Follow-up type: In-patient    [name] Michaelle Copas 02/01/2018, 6:11 PM

## 2018-02-01 NOTE — Lactation Note (Deleted)
This note was copied from a baby's chart. Lactation Consultation Note  Patient Name: Jaclyn Owens EHMCN'O Date: 02/01/2018 Reason for consult: Follow-up assessment  Mom and baby STS post bath.  Mom reports has not breastfed today.  Mom open to pumping.  Reports she can not remember how.  Showed patient how to pump with DEBP.  Infant got fssy  after mom had initiated pumping.  Mom reports she will do it later.  Also demo how to use conversion kit as both single and double manual pump.  Urged mom to follow up with lactation as needed. Maternal Data    Feeding Feeding Type: Formula  LATCH Score                   Interventions Interventions: Breast feeding basics reviewed  Lactation Tools Discussed/Used     Consult Status Consult Status: Follow-up Date: 02/02/18 Follow-up type: In-patient    Rhiannon Sassaman Thompson Caul 02/01/2018, 5:21 PM

## 2018-02-01 NOTE — Lactation Note (Signed)
This note was copied from a baby's chart. Lactation Consultation Note  Patient Name: Jaclyn Owens ZJQBH'A Date: 02/01/2018 Reason for consult: Follow-up assessment  Telephone call from mom wanting help with breastfeeding. Get there and dad is there with his kids and her kids and she is distracted talking with him. Mom reports it has been three hours since infant ate.  Offered to assist with breastfeeding but mom declined.  Says she wanted to pump.  initiated pumping with mom.  Mom not getting anything after a few minutes and mom and dad going back and forth.  Left mom pumping.  Urged her to call lactation as needed. Maternal Data    Feeding    LATCH Score                   Interventions Interventions: Breast feeding basics reviewed  Lactation Tools Discussed/Used     Consult Status Consult Status: PRN Date: 02/02/18 Follow-up type: In-patient    Zissy Hamlett Michaelle Copas 02/01/2018, 7:05 PM

## 2018-02-01 NOTE — Progress Notes (Signed)
   02/01/18 5427  Provider Notification  Reason for Communication Other (Comment)  Provider Name Philipp Deputy  Provider Role Certified nurse midwife  Method of Communication Face to face  Response No new orders  Notification Time 0628  Pt to be evaluated for excruciating perineal pain, without relief after percocet earlier had notified Dr. Primitivo Gauze.

## 2018-02-02 MED ORDER — IBUPROFEN 600 MG PO TABS: 600.0000 mg | ORAL_TABLET | Freq: Four times a day (QID) | ORAL | 0 refills | Status: DC

## 2018-02-02 NOTE — Lactation Note (Signed)
This note was copied from a baby's chart. Lactation Consultation Note  Patient Name: Jaclyn Owens EQAST'M Date: 02/02/2018 Reason for consult: Initial assessment;Early term 76-38.6wks  P2 mother whose infant is now 26 hours old.  Mother breast fed her 22 year old for 1-2 weeks only.  Her feeding plan on admission was formula but now she would like to breast feed.  She stated no one helped her with her first child but she feels like she can get help this time and would like to try.  Baby just fed 61 mls at 1130.  Educated mother on formula volumes and informed her that he probably will not show feeding cues for quite awhile due to the large volume of formula he recently consumed.  Reviewed feeding cues with mother.  She will plan on calling me for latch assistance when baby is ready to feed.  I will educate more thoroughly at that time.  Mother participates in the [facility] program and is planning to go to the [facility] office soon.  She has not been to the [facility] office for 2 months stating that her work hours and transportation issues interfered with getting to the office.  I informed her that the hospital [facility] reps will be here tomorrow.    I will wait for mother to call for latch assistance.   Maternal Data Formula Feeding for Exclusion: No Has patient been taught Hand Expression?: Yes Does the patient have breastfeeding experience prior to this delivery?: Yes  Feeding    LATCH Score                   Interventions    Lactation Tools Discussed/Used WIC Program: Yes   Consult Status Consult Status: Follow-up Date: 02/03/18 Follow-up type: In-patient    Jaclyn Owens 02/02/2018, 12:15 PM

## 2018-02-02 NOTE — Progress Notes (Signed)
CSW attempted to meet with MOB in room 119 to complete an assessment for MH hx. When CSW arrived MOB was resting in bed holding infant; MOB and infant looked comfortable.  CSW explained CSW's role and before CSW could initiate assessment MOB stated, "I'm good I don't need to see nobody for my depression.  I have therapist so if I  need help I will call her."  MOB was adamant about not meeting with CSW and declined all resources.   CSW updated bedside nurse and provide bedside nurse with a resource that can be shared with MOB At discharge.   There are no barriers to discharge.     Omarri Eich Boyd-Gilyard, MSW, LCSW Clinical Social Work [phone]  

## 2018-02-06 ENCOUNTER — Encounter: Payer: Self-pay | Admitting: Obstetrics and Gynecology

## 2018-02-06 DIAGNOSIS — J069 Acute upper respiratory infection, unspecified: Secondary | ICD-10-CM | POA: Diagnosis not present

## 2018-02-06 DIAGNOSIS — B342 Coronavirus infection, unspecified: Secondary | ICD-10-CM | POA: Diagnosis not present

## 2018-03-06 ENCOUNTER — Ambulatory Visit: Payer: Self-pay | Admitting: Obstetrics and Gynecology

## 2018-03-06 ENCOUNTER — Encounter: Payer: Self-pay | Admitting: General Practice

## 2018-06-01 ENCOUNTER — Encounter (HOSPITAL_COMMUNITY): Payer: Self-pay

## 2018-06-01 ENCOUNTER — Emergency Department (HOSPITAL_COMMUNITY)
Admission: EM | Admit: 2018-06-01 | Discharge: 2018-06-01 | Disposition: A | Discharge status: Home / Self Care | Payer: Medicaid Other | Attending: Emergency Medicine | Admitting: Emergency Medicine

## 2018-06-01 ENCOUNTER — Other Ambulatory Visit: Payer: Self-pay

## 2018-06-01 DIAGNOSIS — S61411A Laceration without foreign body of right hand, initial encounter: Secondary | ICD-10-CM | POA: Diagnosis not present

## 2018-06-01 DIAGNOSIS — Y9289 Other specified places as the place of occurrence of the external cause: Secondary | ICD-10-CM | POA: Insufficient documentation

## 2018-06-01 DIAGNOSIS — J45909 Unspecified asthma, uncomplicated: Secondary | ICD-10-CM | POA: Insufficient documentation

## 2018-06-01 DIAGNOSIS — Y93G1 Activity, food preparation and clean up: Secondary | ICD-10-CM | POA: Insufficient documentation

## 2018-06-01 DIAGNOSIS — W260XXA Contact with knife, initial encounter: Secondary | ICD-10-CM | POA: Insufficient documentation

## 2018-06-01 DIAGNOSIS — Z87891 Personal history of nicotine dependence: Secondary | ICD-10-CM | POA: Diagnosis not present

## 2018-06-01 DIAGNOSIS — R000 Tachycardia, unspecified: Secondary | ICD-10-CM | POA: Diagnosis not present

## 2018-06-01 DIAGNOSIS — Y999 Unspecified external cause status: Secondary | ICD-10-CM | POA: Insufficient documentation

## 2018-06-01 LAB — BASIC METABOLIC PANEL
Anion gap: 16 — ABNORMAL HIGH (ref 5–15)
BUN: 8 mg/dL (ref 6–20)
CO2: 19 mmol/L — ABNORMAL LOW (ref 22–32)
Calcium: 9.2 mg/dL (ref 8.9–10.3)
Chloride: 107 mmol/L (ref 98–111)
Creatinine, Ser: 1.05 mg/dL — ABNORMAL HIGH (ref 0.44–1.00)
GFR calc Af Amer: >60 mL/min (ref >60)
GFR calc non Af Amer: >60 mL/min (ref >60)
Glucose, Bld: 118 mg/dL — ABNORMAL HIGH (ref 70–99)
Potassium: 3.2 mmol/L — ABNORMAL LOW (ref 3.5–5.1)
Sodium: 142 mmol/L (ref 135–145)

## 2018-06-01 LAB — CBC
HCT: 36.1 % (ref 36.0–46.0)
Hemoglobin: 12.2 g/dL (ref 12.0–15.0)
MCH: 29.5 pg (ref 26.0–34.0)
MCHC: 33.8 g/dL (ref 30.0–36.0)
MCV: 87.4 fL (ref 80.0–100.0)
Platelets: 280 10*3/uL (ref 150–400)
RBC: 4.13 MIL/uL (ref 3.87–5.11)
RDW: 13.3 % (ref 11.5–15.5)
WBC: 9.4 10*3/uL (ref 4.0–10.5)
nRBC: 0 % (ref 0.0–0.2)

## 2018-06-01 LAB — I-STAT BETA HCG BLOOD, ED (MC, WL, AP ONLY): I-stat hCG, quantitative: <5 m[IU]/mL (ref <5)

## 2018-06-01 LAB — TROPONIN I: Troponin I: <0.03 ng/mL (ref <0.03)

## 2018-06-01 MED ORDER — IBUPROFEN 600 MG PO TABS: 600.0000 mg | ORAL_TABLET | Freq: Four times a day (QID) | ORAL | 0 refills | Status: DC | PRN

## 2018-06-01 MED ORDER — SODIUM CHLORIDE 0.9% FLUSH: 3.0000 mL | Freq: Once | INTRAVENOUS | Status: DC

## 2018-06-01 MED ORDER — LIDOCAINE-EPINEPHRINE 1 %-1:100000 IJ SOLN
10.0000 mL | Freq: Once | INTRAMUSCULAR | Status: AC
  Administered 2018-06-01: 03:00:00 10 mL
  Filled 2018-06-01: qty 10

## 2018-06-01 NOTE — ED Notes (Signed)
ED Provider at bedside. 

## 2018-06-01 NOTE — ED Triage Notes (Signed)
Pt states she was cutting something and cut her hand on the top of her hand and has a 2 in lac bleeding is controlled at this time.

## 2018-06-01 NOTE — ED Provider Notes (Signed)
[facility] EMERGENCY DEPARTMENT Provider Note   CSN: [id number] Arrival date & time: 06/01/18  0112    History   Chief Complaint Chief Complaint  Patient presents with  . Extremity Laceration    HPI Jaclyn Owens is a 23 y.o. female.     23 year old female presents to the emergency department for evaluation of laceration to the top of her right hand.  She reports that she was cutting a watermelon when she accidentally cut her hand.  Bleeding has been controlled with pressure.  Reports severe pain at the time the laceration occurred.  She did not take any medications prior to arrival.  She is not on chronic anticoagulants.  Her tetanus is up-to-date.  The history is provided by the patient. No language interpreter was used.    Past Medical History:  Diagnosis Date  . Asthma   . HSV (herpes simplex virus) infection     Patient Active Problem List   Diagnosis Date Noted  . Group B streptococcal infection 02/01/2018  . Fetal pericardial effusion affecting management of mother 01/31/2018  . Threatened preterm labor, third trimester 01/10/2018  . Preterm labor in third trimester 01/10/2018  . Bilateral calf pain 11/21/2017  . Supervision of other normal pregnancy, antepartum 08/29/2017  . 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 Adjustment disorder with depressed mood 02/24/2017    Past Surgical History:  Procedure Laterality Date  . ARM WOUND REPAIR / CLOSURE       OB History    Gravida  2   Para  2   Term  2   Preterm  0   AB  0   Living  2     SAB  0   TAB  0   Ectopic  0   Multiple  0   Live Births  1            Home Medications    Prior to Admission medications   Medication Sig Start Date End Date Taking? Authorizing Provider  ibuprofen (ADVIL) 600 MG tablet Take 1 tablet (600 mg total) by mouth every 6 (six) hours as needed. 06/01/18   Antony Madura, PA-C    Family History Family History  Problem  Relation Age of Onset  . Asthma Mother   . Diabetes Maternal Aunt   . Cancer Maternal Grandmother     Social History Social History   Tobacco Use  . Smoking status: Former Smoker    Types: Cigarettes, Cigars  . Smokeless tobacco: Never Used  Substance Use Topics  . Alcohol use: Not Currently  . 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    Physical Exam Updated Vital Signs BP 99/74   Pulse 92   Temp 98.7 F (37.1 C)   Resp (!) 25   Ht 5\' 6"  (1.676 m)   Wt 63.5 kg   LMP 05/26/2018   SpO2 97%   BMI 22.60 kg/m   Physical Exam Vitals signs and nursing note reviewed.  Constitutional:      General: She is not in acute distress.    Appearance: She is well-developed. She is not diaphoretic.     Comments: Nontoxic-appearing and in no distress.  HENT:     Head: Normocephalic and atraumatic.  Eyes:     General: No scleral icterus.    Conjunctiva/sclera: Conjunctivae normal.  Neck:     Musculoskeletal: Normal range of motion.  Cardiovascular:     Rate and Rhythm: Normal rate and regular rhythm.     Pulses: Normal pulses.     Comments: 97bpm at bedside. Distal radial pulse 2+ in the RUE. Pulmonary:     Effort: Pulmonary effort is normal. No respiratory distress.  Musculoskeletal: Normal range of motion.  Skin:    General: Skin is warm and dry.     Coloration: Skin is not pale.     Findings: No erythema or rash.     Comments: 3 cm epidermal laceration, with skin gaping, noted to R hand dorsum. No active bleeding.  Neurological:     Mental Status: She is alert and oriented to person, place, and time.     Coordination: Coordination normal.     Comments: Sensation to light touch intact.  Patient able to wiggle all fingers.  Using a cell phone.  Psychiatric:        Behavior: Behavior normal.       ED Treatments / Results  Labs (all labs ordered are listed, but only abnormal results are displayed) Labs Reviewed  BASIC METABOLIC PANEL - Abnormal; Notable for the following components:      Result Value   Potassium 3.2 (*)    CO2 19 (*)    Glucose, Bld 118 (*)    Creatinine, Ser 1.05 (*)    Anion gap 16 (*)    All other components within normal limits  CBC  TROPONIN I  I-STAT BETA HCG BLOOD, ED ([facility], [facility], [facility] ONLY)    EKG None  Radiology No results found.  Procedures Procedures (including critical care time)  LACERATION REPAIR Performed by: Antony Madura Authorized by: Antony Madura Consent: Verbal consent obtained. Risks and benefits: risks, benefits and alternatives were discussed Consent given by: patient Patient identity confirmed: provided demographic data Prepped and Draped in normal sterile fashion Wound explored  Laceration Location: dorsum R hand  Laceration Length: 3cm  No Foreign Bodies seen or palpated  Anesthesia: local infiltration  Local anesthetic: lidocaine 1% with epinephrine  Anesthetic total: 3 ml  Irrigation method: syringe Amount of cleaning: standard  Skin closure: 5-0 chromic  Number of sutures: 4  Technique: simple interrupted  Patient tolerance: Patient tolerated the procedure well with no immediate complications.   Medications Ordered in ED Medications  sodium chloride flush (NS) 0.9 % injection 3 mL (has no administration in time range)  lidocaine-EPINEPHrine (XYLOCAINE W/EPI) 1 %-1:100000 (with pres) injection 10 mL (10 mLs Other Given by Other 06/01/18 8295)     Initial Impression / Assessment and Plan / ED Course  I have reviewed the triage vital signs and the nursing notes.  Pertinent labs & imaging results that were available during my care of the patient were reviewed by me and considered in my medical decision making (see chart for details).        Tdap booster UTD. Laceration occurred < 8 hours prior to  repair which was well tolerated. Pt has no comorbidities to effect normal wound healing. Discussed suture home care w pt and answered questions. Pt to follow up for wound check with PCP as needed. Pt is hemodynamically stable with no complaints prior to discharge.    Vitals:   06/01/18 0201 06/01/18 0214 06/01/18 0230 06/01/18 0300  BP: 111/68  113/74 99/74  Pulse: (!) 129 (!) 102  95 92  Resp:  (!) 22 (!) 22 (!) 25  Temp:      SpO2: 99% 98% 99% 97%  Weight:      Height:        Final Clinical Impressions(s) / ED Diagnoses   Final diagnoses:  Laceration of right hand without foreign body, initial encounter  Sinus tachycardia    ED Discharge Orders         Ordered    ibuprofen (ADVIL) 600 MG tablet  Every 6 hours PRN     06/01/18 0341           Antony MaduraHumes, Jinnifer Montejano, PA-C 06/01/18 0343    Palumbo, April, MD 06/01/18 [phone]

## 2018-07-15 ENCOUNTER — Encounter: Payer: Self-pay | Admitting: General Practice

## 2018-07-15 ENCOUNTER — Other Ambulatory Visit: Payer: Self-pay

## 2018-07-15 ENCOUNTER — Ambulatory Visit (INDEPENDENT_AMBULATORY_CARE_PROVIDER_SITE_OTHER): Payer: Medicaid Other | Admitting: *Deleted

## 2018-07-15 VITALS — BP 123/70 | HR 121 | Temp 98.3°F | Ht 66.0 in | Wt 130.0 lb

## 2018-07-15 DIAGNOSIS — Z3200 Encounter for pregnancy test, result unknown: Secondary | ICD-10-CM

## 2018-07-15 DIAGNOSIS — Z3201 Encounter for pregnancy test, result positive: Secondary | ICD-10-CM

## 2018-07-15 DIAGNOSIS — Z3480 Encounter for supervision of other normal pregnancy, unspecified trimester: Secondary | ICD-10-CM

## 2018-07-15 LAB — POCT URINE PREGNANCY: Preg Test, Ur: POSITIVE — AB

## 2018-07-16 ENCOUNTER — Telehealth: Payer: Self-pay | Admitting: General Practice

## 2018-07-16 MED ORDER — VITAFOL GUMMIES 3.33-0.333-34.8 MG PO CHEW: 3.0000 | CHEWABLE_TABLET | Freq: Every day | ORAL | 12 refills | Status: DC

## 2018-07-16 NOTE — Progress Notes (Signed)
   PRENATAL INTAKE SUMMARY  Ms. Nofziger presents today New OB Nurse Interview.  OB History    Gravida  3   Para  2   Term  2   Preterm  0   AB  0   Living  2     SAB  0   TAB  0   Ectopic  0   Multiple  0   Live Births  1          I have reviewed the patient's medical, obstetrical, social, and family histories, medications, and available lab results.  SUBJECTIVE She has no unusual complaints.  OBJECTIVE Initial nurse interview for history and pregnancy test (New OB). Pregnancy test positive.  EDD: 02/26/2019 by [facility] GA: [redacted]w[redacted]d G3P2002 FHT: no assessed  GENERAL APPEARANCE: alert, well appearing, in no apparent distress, oriented to person, place and time.  ASSESSMENT Normal pregnancy  PLAN Prenatal care-[facility] work to be completed with physical appt. Ultrasound <14 weeks ordered PNV sent to pharmacy.  Derl Barrow, RN

## 2018-07-16 NOTE — Telephone Encounter (Signed)
Pt aware of [place] appt scheduled on 07/24/2018 at 3:00pm and address was given to patient.  Pt verbalized undestanding.

## 2018-07-24 ENCOUNTER — Ambulatory Visit (HOSPITAL_COMMUNITY): Payer: Medicaid Other

## 2018-07-31 ENCOUNTER — Telehealth: Payer: Self-pay | Admitting: Obstetrics and Gynecology

## 2018-07-31 NOTE — Telephone Encounter (Signed)
Called the patient to complete the pre-screen. The patient answered no to COVID19 symptoms and/or being previously diagnosed. Informed the patient of the wearing a face mask, sanitizing hands at the sanitizing station upon entering our office, and no visitors or children are allowed due to the COVID19 restrictions. The patient verbalized understanding. °

## 2018-08-04 ENCOUNTER — Ambulatory Visit (HOSPITAL_COMMUNITY): Admission: RE | Admit: 2018-08-04 | Payer: Medicaid Other | Source: Ambulatory Visit

## 2018-08-04 ENCOUNTER — Ambulatory Visit: Payer: Medicaid Other

## 2018-08-13 ENCOUNTER — Encounter: Payer: Self-pay | Admitting: Obstetrics & Gynecology

## 2018-08-13 ENCOUNTER — Ambulatory Visit (HOSPITAL_COMMUNITY)
Admission: RE | Admit: 2018-08-13 | Discharge: 2018-08-13 | Disposition: A | Discharge status: Home / Self Care | Payer: Medicaid Other | Source: Ambulatory Visit | Attending: Obstetrics and Gynecology | Admitting: Obstetrics and Gynecology

## 2018-08-13 ENCOUNTER — Other Ambulatory Visit: Payer: Self-pay | Admitting: Obstetrics and Gynecology

## 2018-08-13 ENCOUNTER — Telehealth: Payer: Self-pay | Admitting: Obstetrics & Gynecology

## 2018-08-13 ENCOUNTER — Other Ambulatory Visit: Payer: Self-pay

## 2018-08-13 ENCOUNTER — Ambulatory Visit (INDEPENDENT_AMBULATORY_CARE_PROVIDER_SITE_OTHER): Payer: Medicaid Other

## 2018-08-13 DIAGNOSIS — Z3A10 10 weeks gestation of pregnancy: Secondary | ICD-10-CM | POA: Diagnosis not present

## 2018-08-13 DIAGNOSIS — Z3480 Encounter for supervision of other normal pregnancy, unspecified trimester: Secondary | ICD-10-CM | POA: Diagnosis not present

## 2018-08-13 DIAGNOSIS — Z712 Person consulting for explanation of examination or test findings: Secondary | ICD-10-CM

## 2018-08-13 DIAGNOSIS — O0001 Abdominal pregnancy with intrauterine pregnancy: Secondary | ICD-10-CM | POA: Diagnosis not present

## 2018-08-13 NOTE — Progress Notes (Signed)
Pt here today for [place] results.  Pt informed that she has a viable pregnancy 10w 2d today and EDD 03/08/18.  Pt denies any pain or bleeding.  Medications reconciled.  Pt encouraged to take a PNV.  Pt reports having an appt with [facility] on 08/21/18. Appt verified.  Proof of pregnancy letter provided by the front office.   Mel Almond, RN 08/13/18

## 2018-08-14 NOTE — Progress Notes (Signed)
I have reviewed this chart and agree with the RN/CMA assessment and management.    K. Meryl Nevin Grizzle, M.D. Attending [facility] (Faculty Practice)   

## 2018-08-21 ENCOUNTER — Encounter: Payer: Self-pay | Admitting: Obstetrics and Gynecology

## 2018-08-21 ENCOUNTER — Ambulatory Visit (INDEPENDENT_AMBULATORY_CARE_PROVIDER_SITE_OTHER): Payer: Medicaid Other | Admitting: Obstetrics and Gynecology

## 2018-08-21 ENCOUNTER — Other Ambulatory Visit (HOSPITAL_COMMUNITY)
Admission: RE | Admit: 2018-08-21 | Discharge: 2018-08-21 | Disposition: A | Discharge status: Home / Self Care | Payer: Medicaid Other | Source: Ambulatory Visit | Attending: Obstetrics and Gynecology | Admitting: Obstetrics and Gynecology

## 2018-08-21 ENCOUNTER — Other Ambulatory Visit: Payer: Self-pay

## 2018-08-21 DIAGNOSIS — Z3A11 11 weeks gestation of pregnancy: Secondary | ICD-10-CM

## 2018-08-21 DIAGNOSIS — O09899 Supervision of other high risk pregnancies, unspecified trimester: Secondary | ICD-10-CM | POA: Insufficient documentation

## 2018-08-21 DIAGNOSIS — Z3480 Encounter for supervision of other normal pregnancy, unspecified trimester: Secondary | ICD-10-CM

## 2018-08-21 DIAGNOSIS — Z3481 Encounter for supervision of other normal pregnancy, first trimester: Secondary | ICD-10-CM | POA: Diagnosis not present

## 2018-08-21 DIAGNOSIS — O09891 Supervision of other high risk pregnancies, first trimester: Secondary | ICD-10-CM

## 2018-08-21 NOTE — Patient Instructions (Signed)
Eating Plan for Pregnant Women While you are pregnant, your body requires additional nutrition to help support your growing baby. You also have a higher need for some vitamins and minerals, such as folic acid, calcium, iron, and vitamin D. Eating a healthy, well-balanced diet is very important for your health and your baby's health. Your need for extra calories varies for the three 76-monthsegments of your pregnancy (trimesters). For most women, it is recommended to consume:  150 extra calories a day during the first trimester.  300 extra calories a day during the second trimester.  300 extra calories a day during the third trimester. What are tips for following this plan?   Do not try to lose weight or go on a diet during pregnancy.  Limit your overall intake of foods that have "empty calories." These are foods that have little nutritional value, such as sweets, desserts, candies, and sugar-sweetened beverages.  Eat a variety of foods (especially fruits and vegetables) to get a full range of vitamins and minerals.  Take a prenatal vitamin to help meet your additional vitamin and mineral needs during pregnancy, specifically for folic acid, iron, calcium, and vitamin D.  Remember to stay active. Ask your health care provider what types of exercise and activities are safe for you.  Practice good food safety and cleanliness. Wash your hands before you eat and after you prepare raw meat. Wash all fruits and vegetables well before peeling or eating. Taking these actions can help to prevent food-borne illnesses that can be very dangerous to your baby, such as listeriosis. Ask your health care provider for more information about listeriosis. What does 150 extra calories look like? Healthy options that provide 150 extra calories each day could be any of the following:  6-8 oz (170-230 g) of plain low-fat yogurt with  cup of berries.  1 apple with 2 teaspoons (11 g) of peanut butter.  Cut-up  vegetables with  cup (60 g) of hummus.  8 oz (230 mL) or 1 cup of low-fat chocolate milk.  1 stick of string cheese with 1 medium orange.  1 peanut butter and jelly sandwich that is made with one slice of whole-wheat bread and 1 tsp (5 g) of peanut butter. For 300 extra calories, you could eat two of those healthy options each day. What is a healthy amount of weight to gain? The right amount of weight gain for you is based on your BMI before you became pregnant. If your BMI:  Was less than 18 (underweight), you should gain 28-40 lb (13-18 kg).  Was 18-24.9 (normal), you should gain 25-35 lb (11-16 kg).  Was 25-29.9 (overweight), you should gain 15-25 lb (7-11 kg).  Was 30 or greater (obese), you should gain 11-20 lb (5-9 kg). What if I am having twins or multiples? Generally, if you are carrying twins or multiples:  You may need to eat 300-600 extra calories a day.  The recommended range for total weight gain is 25-54 lb (11-25 kg), depending on your BMI before pregnancy.  Talk with your health care provider to find out about nutritional needs, weight gain, and exercise that is right for you. What foods can I eat?  Grains All grains. Choose whole grains, such as whole-wheat bread, oatmeal, or brown rice. Vegetables All vegetables. Eat a variety of colors and types of vegetables. Remember to wash your vegetables well before peeling or eating. Fruits All fruits. Eat a variety of colors and types of fruit. Remember to wash  your fruits well before peeling or eating. Meats and other protein foods Lean meats, including chicken, [place], fish, and lean cuts of beef, veal, or pork. If you eat fish or seafood, choose options that are higher in omega-3 fatty acids and lower in mercury, such as salmon, herring, mussels, trout, sardines, pollock, shrimp, crab, and lobster. Tofu. Tempeh. Beans. Eggs. Peanut butter and other nut butters. Make sure that all meats, poultry, and eggs are cooked to  food-safe temperatures or "well-done." Two or more servings of fish are recommended each week in order to get the most benefits from omega-3 fatty acids that are found in seafood. Choose fish that are lower in mercury. You can find more information online:  GuamGaming.ch Dairy Pasteurized milk and milk alternatives (such as almond milk). Pasteurized yogurt and pasteurized cheese. Cottage cheese. Sour cream. Beverages Water. Juices that contain 100% fruit juice or vegetable juice. Caffeine-free teas and decaffeinated coffee. Drinks that contain caffeine are okay to drink, but it is better to avoid caffeine. Keep your total caffeine intake to less than 200 mg each day (which is 12 oz or 355 mL of coffee, tea, or soda) or the limit as told by your health care provider. Fats and oils Fats and oils are okay to include in moderation. Sweets and desserts Sweets and desserts are okay to include in moderation. Seasoning and other foods All pasteurized condiments. The items listed above may not be a complete list of recommended foods and beverages. Contact your dietitian for more options. The items listed above may not be a complete list of foods and beverages [you/your child] can eat. Contact a dietitian for more information. What foods are not recommended? Vegetables Raw (unpasteurized) vegetable juices. Fruits Unpasteurized fruit juices. Meats and other protein foods Lunch meats, bologna, hot dogs, or other deli meats. (If you must eat those meats, reheat them until they are steaming hot.) Refrigerated pat, meat spreads from a meat counter, smoked seafood that is found in the refrigerated section of a store. Raw or undercooked meats, poultry, and eggs. Raw fish, such as sushi or sashimi. Fish that have high mercury content, such as tilefish, shark, swordfish, and king mackerel. To learn more about mercury in fish, talk with your health care provider or look for online resources, such as:   [employer]Gaming.ch Dairy Raw (unpasteurized) milk and any foods that have raw milk in them. Soft cheeses, such as feta, queso blanco, queso fresco, Brie, Camembert cheeses, blue-veined cheeses, and Panela cheese (unless it is made with pasteurized milk, which must be stated on the label). Beverages Alcohol. Sugar-sweetened beverages, such as sodas, teas, or energy drinks. Seasoning and other foods Homemade fermented foods and drinks, such as pickles, sauerkraut, or kombucha drinks. (Store-bought pasteurized versions of these are okay.) Salads that are made in a store or deli, such as ham salad, chicken salad, egg salad, tuna salad, and seafood salad. The items listed above may not be a complete list of foods and beverages to avoid. Contact your dietitian for more information. The items listed above may not be a complete list of foods and beverages [you/your child] should avoid. Contact a dietitian for more information. Where to find more information To calculate the number of calories you need based on your height, weight, and activity level, you can use an online calculator such as:  MobileTransition.ch To calculate how much weight you should gain during pregnancy, you can use an online pregnancy weight gain calculator such as:  StreamingFood.com.cy Summary  While you  are pregnant, your body requires additional nutrition to help support your growing baby.  Eat a variety of foods, especially fruits and vegetables to get a full range of vitamins and minerals.  Practice good food safety and cleanliness. Wash your hands before you eat and after you prepare raw meat. Wash all fruits and vegetables well before peeling or eating. Taking these actions can help to prevent food-borne illnesses, such as listeriosis, that can be very dangerous to your baby.  Do not eat raw meat or fish. Do not eat fish that have high mercury content, such as tilefish, shark,  swordfish, and king mackerel. Do not eat unpasteurized (raw) dairy.  Take a prenatal vitamin to help meet your additional vitamin and mineral needs during pregnancy, specifically for folic acid, iron, calcium, and vitamin D. This information is not intended to replace advice given to you by your health care provider. Make sure you discuss any questions you have with your health care provider. Document Released: 10/30/2013 Document Revised: 05/08/2018 Document Reviewed: 10/12/2016 Elsevier Patient Education  [place] of Pregnancy  The second trimester is from week 14 through week 27 (month 4 through 6). This is often the time in pregnancy that you feel your best. Often times, morning sickness has lessened or quit. You may have more energy, and you may get hungry more often. Your unborn baby is growing rapidly. At the end of the sixth month, he or she is about 9 inches long and weighs about 1 pounds. You will likely feel the baby move between 18 and 20 weeks of pregnancy. Follow these instructions at home: Medicines  Take over-the-counter and prescription medicines only as told by your doctor. Some medicines are safe and some medicines are not safe during pregnancy.  Take a prenatal vitamin that contains at least 600 micrograms (mcg) of folic acid.  If you have trouble pooping (constipation), take medicine that will make your stool soft (stool softener) if your doctor approves. Eating and drinking   Eat regular, healthy meals.  Avoid raw meat and uncooked cheese.  If you get low calcium from the food you eat, talk to your doctor about taking a daily calcium supplement.  Avoid foods that are high in fat and sugars, such as fried and sweet foods.  If you feel sick to your stomach (nauseous) or throw up (vomit): ? Eat 4 or 5 small meals a day instead of 3 large meals. ? Try eating a few soda crackers. ? Drink liquids between meals instead of during meals.  To  prevent constipation: ? Eat foods that are high in fiber, like fresh fruits and vegetables, whole grains, and beans. ? Drink enough fluids to keep your pee (urine) clear or pale yellow. Activity  Exercise only as told by your doctor. Stop exercising if you start to have cramps.  Do not exercise if it is too hot, too humid, or if you are in a place of great height (high altitude).  Avoid heavy lifting.  Wear low-heeled shoes. Sit and stand up straight.  You can continue to have sex unless your doctor tells you not to. Relieving pain and discomfort  Wear a good support bra if your breasts are tender.  Take warm water baths (sitz baths) to soothe pain or discomfort caused by hemorrhoids. Use hemorrhoid cream if your doctor approves.  Rest with your legs raised if you have leg cramps or low back pain.  If you develop puffy, bulging veins (varicose veins) in  your legs: ? Wear support hose or compression stockings as told by your doctor. ? Raise (elevate) your feet for 15 minutes, 3-4 times a day. ? Limit salt in your food. Prenatal care  Write down your questions. Take them to your prenatal visits.  Keep all your prenatal visits as told by your doctor. This is important. Safety  Wear your seat belt when driving.  Make a list of emergency phone numbers, including numbers for family, friends, the hospital, and police and fire departments. General instructions  Ask your doctor about the right foods to eat or for help finding a counselor, if you need these services.  Ask your doctor about local prenatal classes. Begin classes before month 6 of your pregnancy.  Do not use hot tubs, steam rooms, or saunas.  Do not douche or use tampons or scented sanitary pads.  Do not cross your legs for long periods of time.  Visit your dentist if you have not done so. Use a soft toothbrush to brush your teeth. Floss gently.  Avoid all smoking, herbs, and alcohol. Avoid drugs that are not  approved by your doctor.  Do not use any products that contain nicotine or tobacco, such as cigarettes and e-cigarettes. If you need help quitting, ask your doctor.  Avoid cat litter boxes and soil used by cats. These carry germs that can cause birth defects in the baby and can cause a loss of your baby (miscarriage) or stillbirth. Contact a doctor if:  You have mild cramps or pressure in your lower belly.  You have pain when you pee (urinate).  You have bad smelling fluid coming from your vagina.  You continue to feel sick to your stomach (nauseous), throw up (vomit), or have watery poop (diarrhea).  You have a nagging pain in your belly area.  You feel dizzy. Get help right away if:  You have a fever.  You are leaking fluid from your vagina.  You have spotting or bleeding from your vagina.  You have severe belly cramping or pain.  You lose or gain weight rapidly.  You have trouble catching your breath and have chest pain.  You notice sudden or extreme puffiness (swelling) of your face, hands, ankles, feet, or legs.  You have not felt the baby move in over an hour.  You have severe headaches that do not go away when you take medicine.  You have trouble seeing. Summary  The second trimester is from week 14 through week 27 (months 4 through 6). This is often the time in pregnancy that you feel your best.  To take care of yourself and your unborn baby, you will need to eat healthy meals, take medicines only if your doctor tells you to do so, and do activities that are safe for you and your baby.  Call your doctor if you get sick or if you notice anything unusual about your pregnancy. Also, call your doctor if you need help with the right food to eat, or if you want to know what activities are safe for you. This information is not intended to replace advice given to you by your health care provider. Make sure you discuss any questions you have with your health care  provider. Document Released: 04/11/2009 Document Revised: 05/09/2018 Document Reviewed: 02/21/2016 Elsevier Patient Education  2020 [employer]

## 2018-08-21 NOTE — Telephone Encounter (Signed)
Opened in error

## 2018-08-21 NOTE — Progress Notes (Signed)
Subjective:    Jaclyn Owens is being seen today for her first obstetrical visit.  This is not a planned pregnancy. She is a G3P2002 at [redacted]w[redacted]d gestation.  Her obstetrical history is significant for closely spaced pregnancies. Relationship with FOB Jaclyn Owens): significant other, living together. FOB is "away" right now. Patient does intend to breast feed. Pregnancy history fully reviewed.  Patient reports no complaints.  Review of Systems:   Review of Systems  Constitutional: Negative.   HENT: Negative.   Eyes: Negative.   Respiratory: Negative.   Cardiovascular: Negative.   Gastrointestinal: Negative.   Endocrine: Negative.   Genitourinary: Negative.   Musculoskeletal: Negative.   Skin: Negative.   Allergic/Immunologic: Negative.   Neurological: Negative.   Hematological: Negative.   Psychiatric/Behavioral: Negative.     Objective:     BP 115/64   Pulse (!) 114   Temp 98.2 F (36.8 C)   Wt 139 lb (63 kg)   LMP 05/26/2018 (Exact Date)   BMI 22.44 kg/m  Physical Exam  Nursing note and vitals reviewed. Constitutional: She is oriented to person, place, and time. She appears well-developed and well-nourished.  HENT:  Head: Normocephalic and atraumatic.  Right Ear: External ear normal.  Left Ear: External ear normal.  Nose: Nose normal.  Mouth/Throat: Oropharynx is clear and moist.  Eyes: Pupils are equal, round, and reactive to light. Conjunctivae and EOM are normal.  Neck: Normal range of motion. Neck supple.  Cardiovascular: Normal rate, regular rhythm, normal heart sounds and intact distal pulses.  Respiratory: Effort normal and breath sounds normal.  GI: Soft. Bowel sounds are normal.  Genitourinary:    Vulva normal.     Vaginal discharge present.   Musculoskeletal: Normal range of motion.  Neurological: She is oriented to person, place, and time. She has normal reflexes.  Skin: Skin is warm and dry.  Psychiatric: She has a normal mood and affect. Her behavior is  normal. Judgment and thought content normal.    Maternal Exam:  Abdomen: Patient reports no abdominal tenderness. Fundal height is S=D.   Fetal presentation: no presenting part  Introitus: Normal vulva. Vagina is positive for vaginal discharge.  Ferning test: not done.  Nitrazine test: not done. Amniotic fluid character: not assessed.  Pelvis: adequate for delivery.   Cervix: Cervix evaluated by sterile speculum exam and digital exam.     Fetal Exam Fetal Monitor Review: Mode: hand-held doppler probe.   Baseline rate: 161 bpm.      Assessment:    Pregnancy: [id number] Patient Active Problem List   Diagnosis Date Noted  . Group B streptococcal infection 02/01/2018  . Fetal pericardial effusion affecting management of mother 01/31/2018  . Threatened preterm labor, third trimester 01/10/2018  . Preterm labor in third trimester 01/10/2018  . Bilateral calf pain 11/21/2017  . Supervision of other normal pregnancy, antepartum 08/29/2017  . HSV-2 infection complicating pregnancy 37/90/2409  . Adjustment disorder with depressed mood 02/24/2017       Plan:     Initial labs & Panorama drawn. Prenatal vitamins. Problem list reviewed and updated. AFP3 discussed: undecided. Role of ultrasound in pregnancy discussed; fetal survey: ordered. Amniocentesis discussed: not indicated. The nature of [facility] with multiple MDs and other Advanced Practice Providers was explained to patient; also emphasized that residents, students are part of our team.  Discussed optimized OB schedule and video visits. Advised can have an in-office visit whenever she feels she needs to be seen.  Advised to  call during normal business hours and there is an after-hours nurse line available.  Follow up in [facility] via [facility]. 50% of 40 min visit spent on counseling and coordination of care.     Raelyn Moraolitta Migdalia Olejniczak, MSN, CNM 08/21/2018

## 2018-08-22 LAB — OBSTETRIC PANEL, INCLUDING HIV
Antibody Screen: NEGATIVE
Basophils Absolute: 0 10*3/uL (ref 0.0–0.2)
Basos: 0 %
EOS (ABSOLUTE): 0.2 10*3/uL (ref 0.0–0.4)
Eos: 3 %
HIV Screen 4th Generation wRfx: NONREACTIVE
Hematocrit: 36.1 % (ref 34.0–46.6)
Hemoglobin: 12.4 g/dL (ref 11.1–15.9)
Hepatitis B Surface Ag: NEGATIVE
Immature Grans (Abs): 0 10*3/uL (ref 0.0–0.1)
Immature Granulocytes: 0 %
Lymphocytes Absolute: 2 10*3/uL (ref 0.7–3.1)
Lymphs: 23 %
MCH: 30.1 pg (ref 26.6–33.0)
MCHC: 34.3 g/dL (ref 31.5–35.7)
MCV: 88 fL (ref 79–97)
Monocytes Absolute: 0.5 10*3/uL (ref 0.1–0.9)
Monocytes: 6 %
Neutrophils Absolute: 6 10*3/uL (ref 1.4–7.0)
Neutrophils: 68 %
Platelets: 252 10*3/uL (ref 150–450)
RBC: 4.12 x10E6/uL (ref 3.77–5.28)
RDW: 12.7 % (ref 11.7–15.4)
RPR Ser Ql: NONREACTIVE
Rh Factor: POSITIVE
Rubella Antibodies, IGG: 9.3 index (ref >0.99)
WBC: 8.7 10*3/uL (ref 3.4–10.8)

## 2018-08-22 MED ORDER — VITAFOL GUMMIES 3.33-0.333-34.8 MG PO CHEW: 3.0000 | CHEWABLE_TABLET | Freq: Every day | ORAL | 0 refills | Status: DC

## 2018-08-22 NOTE — Addendum Note (Signed)
Addended by: Derl Barrow on: 08/22/2018 08:38 AM   Modules accepted: Orders

## 2018-08-23 LAB — CERVICOVAGINAL ANCILLARY ONLY
Bacterial vaginitis: POSITIVE — AB
Candida vaginitis: NEGATIVE
Chlamydia: NEGATIVE
Neisseria Gonorrhea: NEGATIVE
Trichomonas: NEGATIVE

## 2018-08-23 LAB — URINE CULTURE, OB REFLEX

## 2018-08-23 LAB — CULTURE, OB URINE

## 2018-08-25 ENCOUNTER — Other Ambulatory Visit: Payer: Self-pay | Admitting: *Deleted

## 2018-08-25 DIAGNOSIS — Z3480 Encounter for supervision of other normal pregnancy, unspecified trimester: Secondary | ICD-10-CM

## 2018-08-25 MED ORDER — METRONIDAZOLE 500 MG PO TABS: 500.0000 mg | ORAL_TABLET | Freq: Two times a day (BID) | ORAL | 0 refills | Status: DC

## 2018-08-27 ENCOUNTER — Encounter: Payer: Self-pay | Admitting: General Practice

## 2018-08-29 ENCOUNTER — Encounter: Payer: Self-pay | Admitting: General Practice

## 2018-09-02 ENCOUNTER — Encounter: Payer: Self-pay | Admitting: General Practice

## 2018-10-07 ENCOUNTER — Ambulatory Visit (HOSPITAL_COMMUNITY): Payer: Medicaid Other

## 2018-10-10 DIAGNOSIS — O0990 Supervision of high risk pregnancy, unspecified, unspecified trimester: Secondary | ICD-10-CM | POA: Diagnosis not present

## 2018-10-14 ENCOUNTER — Ambulatory Visit (HOSPITAL_COMMUNITY): Payer: Medicaid Other

## 2018-10-16 ENCOUNTER — Encounter: Payer: Self-pay | Admitting: Obstetrics and Gynecology

## 2018-10-16 ENCOUNTER — Telehealth (INDEPENDENT_AMBULATORY_CARE_PROVIDER_SITE_OTHER): Payer: Medicaid Other | Admitting: Obstetrics and Gynecology

## 2018-10-16 DIAGNOSIS — Z3A19 19 weeks gestation of pregnancy: Secondary | ICD-10-CM

## 2018-10-16 DIAGNOSIS — Z3480 Encounter for supervision of other normal pregnancy, unspecified trimester: Secondary | ICD-10-CM

## 2018-10-16 DIAGNOSIS — O09899 Supervision of other high risk pregnancies, unspecified trimester: Secondary | ICD-10-CM

## 2018-10-16 DIAGNOSIS — O09892 Supervision of other high risk pregnancies, second trimester: Secondary | ICD-10-CM

## 2018-10-16 NOTE — Progress Notes (Signed)
MY CHART VIDEO VIRTUAL OBSTETRICS VISIT ENCOUNTER NOTE  I connected with [name] on 10/16/18 at 10:10 AM EDT by My Chart video at home and verified that I am speaking with the correct person using two identifiers.   I discussed the limitations, risks, security and privacy concerns of performing an evaluation and management service by My Chart video and the availability of in person appointments. I also discussed with the patient that there may be a patient responsible charge related to this service. The patient expressed understanding and agreed to proceed.  Subjective:  Jaclyn Owens is a 23 y.o. G3P2002 at [redacted]w[redacted]d being followed for ongoing prenatal care.  She is currently monitored for the following issues for this low-risk pregnancy and has Adjustment disorder with depressed mood; Supervision of other normal pregnancy, antepartum; HSV-2 infection complicating pregnancy; and Short interval between pregnancies affecting pregnancy, antepartum on their problem list.  Patient reports LT earache x 1 week. She states, "I feel like I'm getting sick." She has not taken anything for the pain and has not had it evaluated. No other sx's. Planning to go to [facility] for evaluation. She missed her U/S appt this past Tuesday and has it rescheduled for 10/21/18. Reports fetal movement. Denies any contractions, bleeding or leaking of fluid.   The following portions of the patient's history were reviewed and updated as appropriate: allergies, current medications, past family history, past medical history, past social history, past surgical history and problem list.   Objective:   General:  Alert, oriented and cooperative.   Mental Status: Normal mood and affect perceived. Normal judgment and thought content.  Rest of physical exam deferred due to type of encounter  LMP 05/26/2018 (Exact Date)  **Patient unable to operate BP cuff at home  Assessment and Plan:  Pregnancy: G3P2002 at [redacted]w[redacted]d  1. Supervision  of other normal pregnancy, antepartum - Advised to seek care at Urgent Care for earache - Explained not to go to [facility] for earache, because [facility] is for OB emergencies only  - Advised of the importance of keeping U/S appt next week and results will be reviewed by Radiologist and then sent to our office. - Places to get circumcision placed in AVS on My Chart. - Patient scheduled to come to office for BP check on 10/17/18 and instructed to bring home BP cuff with her.   Preterm labor symptoms and general obstetric precautions including but not limited to vaginal bleeding, contractions, leaking of fluid and fetal movement were reviewed in detail with the patient.  I discussed the assessment and treatment plan with the patient. The patient was provided an opportunity to ask questions and all were answered. The patient agreed with the plan and demonstrated an understanding of the instructions. The patient was advised to call back or seek an in-person office evaluation/go to [facility] at [facility] for any urgent or concerning symptoms. Please refer to After Visit Summary for other counseling recommendations.   I provided 5 minutes of non-face-to-face time during this encounter. There was 5 minutes of chart review time spent prior to this encounter. Total time spent = 10 minutes.  Return in about 5 weeks (around 11/20/2018) for Return OB - My Chart video.  Future Appointments  Date Time Provider [place]  10/17/2018 10:30 AM [place] None  10/21/2018  2:45 PM [place] 2 [id number]  11/20/2018  9:30 AM Laury Deep, CNM [facility] None    Laury Deep, [facility] for [employer],  [place]

## 2018-10-16 NOTE — Patient Instructions (Signed)
CIRCUMCISION  Circumcision is considered an elective/non-medically necessary procedure. There are many reasons parents decide to have their sons circumsized. During the first year of life circumcised males have a reduced risk of urinary tract infections but after this year the rates between circumcised males and uncircumcised males are the same.  It is safe to have your son circumcised outside of the hospital and the places above perform them regularly.    Places to have your son circumcised:    [facility] [phone] $480 by 4 wks  [facility]            [phone] $244 by 4 wks  [facility]            [phone] $175 by 2 wks  [facility]            [phone] $250 by 7 days [facility]            [phone] $150 by 4 wks  These prices sometimes change, but are roughly what you can expect to pay. Please call and confirm pricing.

## 2018-10-17 ENCOUNTER — Ambulatory Visit: Payer: Medicaid Other

## 2018-10-18 ENCOUNTER — Other Ambulatory Visit: Payer: Self-pay

## 2018-10-18 ENCOUNTER — Emergency Department (HOSPITAL_COMMUNITY)
Admission: EM | Admit: 2018-10-18 | Discharge: 2018-10-18 | Disposition: A | Discharge status: Home / Self Care | Payer: Medicaid Other | Attending: Emergency Medicine | Admitting: Emergency Medicine

## 2018-10-18 DIAGNOSIS — H9202 Otalgia, left ear: Secondary | ICD-10-CM | POA: Diagnosis present

## 2018-10-18 DIAGNOSIS — Z87891 Personal history of nicotine dependence: Secondary | ICD-10-CM | POA: Diagnosis not present

## 2018-10-18 DIAGNOSIS — Z79899 Other long term (current) drug therapy: Secondary | ICD-10-CM | POA: Insufficient documentation

## 2018-10-18 DIAGNOSIS — H6692 Otitis media, unspecified, left ear: Secondary | ICD-10-CM | POA: Diagnosis not present

## 2018-10-18 DIAGNOSIS — J45909 Unspecified asthma, uncomplicated: Secondary | ICD-10-CM | POA: Insufficient documentation

## 2018-10-18 DIAGNOSIS — H7292 Unspecified perforation of tympanic membrane, left ear: Secondary | ICD-10-CM | POA: Insufficient documentation

## 2018-10-18 MED ORDER — AMOXICILLIN 500 MG PO CAPS
1000.0000 mg | ORAL_CAPSULE | Freq: Once | ORAL | Status: AC
  Administered 2018-10-18: 1000 mg via ORAL
  Filled 2018-10-18: qty 2

## 2018-10-18 MED ORDER — AMOXICILLIN-POT CLAVULANATE 875-125 MG PO TABS: 1.0000 | ORAL_TABLET | Freq: Two times a day (BID) | ORAL | 0 refills | Status: DC

## 2018-10-18 MED ORDER — ACETAMINOPHEN 325 MG PO TABS
650.0000 mg | ORAL_TABLET | Freq: Once | ORAL | Status: AC
  Administered 2018-10-18: 20:00:00 650 mg via ORAL
  Filled 2018-10-18: qty 2

## 2018-10-18 NOTE — Discharge Instructions (Addendum)
It was our pleasure to provide your ER care today - we hope that you feel better.  Take antibiotic (augmentin) as prescribed.   Take acetaminophen as need.   Keep ear dry. Do not stick q-tips or anything else into ear.   Follow up with ENT doctor in the coming week - call office Monday to arrange appointment.   Return to ER if worse, new symptoms, fevers, worsening or severe pain, severe headache, or other concern.

## 2018-10-18 NOTE — ED Provider Notes (Signed)
[facility] Provider Note   CSN: [id number] Arrival date & time: 10/18/18  1821     History   Chief Complaint Chief Complaint  Patient presents with   Otalgia    HPI Jaclyn Owens is a 23 y.o. female.     Patient c/o left earache with drainage from ear. Initially started as left earache 1 week ago that patient indicates felt c/w prior ear infection. Patient notes cleaning ear with qtips. A few days after onset ear pain, also notes drainage from ear (pt unsure of type of drainage, not grossly bloody). No tinnitus. +muffled quality to hearing left ear. Denies pain behind ear. No ear lobe swelling. No facial pain or swelling. No sore throat or throat swelling. No severe headache. No fever/chills. No trauma to ear (other than using q tips to clean ear). No vertigo or dizziness.   The history is provided by the patient.  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 Authorizing Provider  metroNIDAZOLE (FLAGYL) 500 MG tablet Take 1 tablet (500 mg total) by mouth 2 (two) times daily.  08/25/18   Raelyn Moraawson, Rolitta, CNM  Prenatal Vit-Fe Phos-FA-Omega (VITAFOL GUMMIES) 3.33-0.333-34.8 MG CHEW Chew 3 each by mouth daily. 07/16/18   Raelyn Moraawson, Rolitta, CNM  Prenatal Vit-Fe Phos-FA-Omega (VITAFOL GUMMIES) 3.33-0.333-34.8 MG CHEW Chew 3 each by mouth daily. 08/22/18   Raelyn Moraawson, Rolitta, CNM    Family History Family History  Problem Relation Age of Onset   Asthma Mother    Diabetes Maternal Aunt    Cancer Maternal Grandmother     Social History Social History   Tobacco Use   Smoking status: Former Smoker    Types: Cigarettes, Cigars   Smokeless tobacco: Never Used  Substance Use Topics   Alcohol use: Not Currently   Drug use: No    Types: Marijuana    Comment: MJ use 01-24-15     Allergies   Bee venom   Review of Systems Review of Systems  Constitutional: Negative for fever.  HENT: Positive for ear discharge and ear pain. Negative for sore throat.   Eyes: Negative for pain and redness.  Respiratory: Negative for cough.   Gastrointestinal: Negative for nausea and vomiting.  Genitourinary:       +pregnant. No vaginal discharge or bleeding. No pain.   Skin: Negative for rash.  Neurological:  Negative for headaches.     Physical Exam Updated Vital Signs BP 98/64 (BP Location: Left Arm)    Pulse 90    Temp 98.4 F (36.9 C) (Oral)    Resp 16    LMP 05/26/2018 (Exact Date)    SpO2 99%   Physical Exam Vitals signs and nursing note reviewed.  Constitutional:      Appearance: Normal appearance. She is well-developed.  HENT:     Head: Atraumatic.     Right Ear: Tympanic membrane and ear canal normal.     Left Ear: Ear canal and external ear normal.     Ears:     Comments: Perforated left TM with small amount fluid in canal. eac not tender or swollen. No mastoid tenderness.     Nose: Nose normal.     Mouth/Throat:     Mouth: Mucous membranes are moist.     Pharynx: Oropharynx is clear. No oropharyngeal exudate or posterior oropharyngeal erythema.  Eyes:      General: No scleral icterus.    Conjunctiva/sclera: Conjunctivae normal.     Pupils: Pupils are equal, round, and reactive to light.  Neck:     Musculoskeletal: Normal range of motion and neck supple. No neck rigidity or muscular tenderness.     Trachea: No tracheal deviation.  Cardiovascular:     Rate and Rhythm: Normal rate.     Pulses: Normal pulses.  Pulmonary:     Effort: Pulmonary effort is normal. No respiratory distress.     Breath sounds: Normal breath sounds.  Genitourinary:    Comments: No cva tenderness.  Musculoskeletal:        General: No swelling.  Lymphadenopathy:     Cervical: No cervical adenopathy.  Skin:    General: Skin is warm and dry.     Findings: No rash.  Neurological:     Mental Status: She is alert.     Comments: Alert, speech normal. Steady gait.   Psychiatric:        Mood and Affect: Mood normal.      ED Treatments / Results  Labs (all labs ordered are listed, but only abnormal results are displayed) Labs Reviewed - No data to display  EKG None  Radiology No results found.  Procedures Procedures (including critical care time)  Medications Ordered in ED Medications  amoxicillin (AMOXIL) capsule 1,000 mg (has no administration in time range)     Initial Impression / Assessment and Plan / ED Course  I have reviewed the triage vital signs and the nursing notes.  Pertinent labs & imaging results that were available during my care of the patient were reviewed by me and considered in my medical decision making (see chart for details).  Confirmed nkda w pt.   Reviewed nursing notes and prior charts for additional history.   Antibiotic given in ED. rx for home.  Discussed need for ENT f/u.   Return precautions provided.     Final Clinical Impressions(s) / ED Diagnoses   Final diagnoses:  None    ED Discharge Orders    None       Lajean Saver, MD 10/18/18 2012

## 2018-10-18 NOTE — ED Triage Notes (Signed)
Pt endorses left ear pain/drainage x 1 week. Pt is [redacted] weeks pregnant. VSS.

## 2018-10-21 ENCOUNTER — Other Ambulatory Visit: Payer: Self-pay

## 2018-10-21 ENCOUNTER — Ambulatory Visit (HOSPITAL_COMMUNITY)
Admission: RE | Admit: 2018-10-21 | Discharge: 2018-10-21 | Disposition: A | Discharge status: Home / Self Care | Payer: Medicaid Other | Source: Ambulatory Visit | Attending: Obstetrics and Gynecology | Admitting: Obstetrics and Gynecology

## 2018-10-21 DIAGNOSIS — Z363 Encounter for antenatal screening for malformations: Secondary | ICD-10-CM

## 2018-10-21 DIAGNOSIS — Z3480 Encounter for supervision of other normal pregnancy, unspecified trimester: Secondary | ICD-10-CM | POA: Diagnosis not present

## 2018-10-21 DIAGNOSIS — Z3A20 20 weeks gestation of pregnancy: Secondary | ICD-10-CM | POA: Diagnosis not present

## 2018-10-21 DIAGNOSIS — O09899 Supervision of other high risk pregnancies, unspecified trimester: Secondary | ICD-10-CM | POA: Diagnosis not present

## 2018-10-21 DIAGNOSIS — O09892 Supervision of other high risk pregnancies, second trimester: Secondary | ICD-10-CM | POA: Diagnosis not present

## 2018-10-22 ENCOUNTER — Other Ambulatory Visit (HOSPITAL_COMMUNITY): Payer: Self-pay | Admitting: *Deleted

## 2018-10-22 DIAGNOSIS — Z362 Encounter for other antenatal screening follow-up: Secondary | ICD-10-CM

## 2018-10-30 ENCOUNTER — Encounter (HOSPITAL_COMMUNITY): Payer: Self-pay | Admitting: Obstetrics and Gynecology

## 2018-10-30 DIAGNOSIS — O368390 Maternal care for abnormalities of the fetal heart rate or rhythm, unspecified trimester, not applicable or unspecified: Secondary | ICD-10-CM | POA: Diagnosis not present

## 2018-10-30 DIAGNOSIS — Z3A21 21 weeks gestation of pregnancy: Secondary | ICD-10-CM | POA: Diagnosis not present

## 2018-10-30 DIAGNOSIS — O368320 Maternal care for abnormalities of the fetal heart rate or rhythm, second trimester, not applicable or unspecified: Secondary | ICD-10-CM | POA: Diagnosis not present

## 2018-11-18 ENCOUNTER — Other Ambulatory Visit: Payer: Self-pay

## 2018-11-18 ENCOUNTER — Ambulatory Visit (HOSPITAL_COMMUNITY): Payer: Medicaid Other

## 2018-11-18 ENCOUNTER — Ambulatory Visit (HOSPITAL_COMMUNITY)
Admission: RE | Admit: 2018-11-18 | Discharge: 2018-11-18 | Disposition: A | Discharge status: Home / Self Care | Payer: Medicaid Other | Source: Ambulatory Visit | Attending: Maternal & Fetal Medicine | Admitting: Maternal & Fetal Medicine

## 2018-11-18 DIAGNOSIS — Z3A24 24 weeks gestation of pregnancy: Secondary | ICD-10-CM

## 2018-11-18 DIAGNOSIS — O09892 Supervision of other high risk pregnancies, second trimester: Secondary | ICD-10-CM | POA: Diagnosis not present

## 2018-11-18 DIAGNOSIS — Z362 Encounter for other antenatal screening follow-up: Secondary | ICD-10-CM | POA: Insufficient documentation

## 2018-11-20 ENCOUNTER — Encounter: Payer: Medicaid Other | Admitting: Obstetrics and Gynecology

## 2018-12-03 ENCOUNTER — Ambulatory Visit (INDEPENDENT_AMBULATORY_CARE_PROVIDER_SITE_OTHER): Payer: Medicaid Other

## 2018-12-03 ENCOUNTER — Other Ambulatory Visit: Payer: Self-pay

## 2018-12-03 VITALS — BP 97/66 | HR 90 | Temp 98.3°F | Wt 157.2 lb

## 2018-12-03 DIAGNOSIS — Z3A26 26 weeks gestation of pregnancy: Secondary | ICD-10-CM

## 2018-12-03 DIAGNOSIS — Z3482 Encounter for supervision of other normal pregnancy, second trimester: Secondary | ICD-10-CM | POA: Diagnosis not present

## 2018-12-03 DIAGNOSIS — Z23 Encounter for immunization: Secondary | ICD-10-CM

## 2018-12-03 DIAGNOSIS — Z3480 Encounter for supervision of other normal pregnancy, unspecified trimester: Secondary | ICD-10-CM | POA: Diagnosis not present

## 2018-12-03 DIAGNOSIS — F4321 Adjustment disorder with depressed mood: Secondary | ICD-10-CM

## 2018-12-03 NOTE — Progress Notes (Signed)
   PRENATAL VISIT NOTE  Subjective:  Jaclyn Owens is a 23 y.o. G3P2002 at [redacted]w[redacted]d who presents today for routine prenatal care.  She is currently being monitored for supervision of a low-risk pregnancy with problems as listed below.  Patient has no current pregnancy related concerns and endorses fetal movement.  However, she reports having sharp abdominal pain last week that was intermittent.  She states it resolved on its own and she has had no pain this week.  She denies vaginal concerns including discharge, bleeding, leaking, itching, or burning. She endorses safety at home and denies depressive feelings.   Patient Active Problem List   Diagnosis Date Noted  . Short interval between pregnancies affecting pregnancy, antepartum 08/21/2018  . Supervision of other normal pregnancy, antepartum 08/29/2017  . HSV-2 infection complicating pregnancy 75/64/3329  . Adjustment disorder with depressed mood 02/24/2017    The following portions of the patient's history were reviewed and updated as appropriate: allergies, current medications, past family history, past medical history, past social history, past surgical history and problem list. Problem list updated.  Objective:   Vitals:   12/03/18 0807  BP: 97/66  Pulse: 90  Temp: 98.3 F (36.8 C)  Weight: 157 lb 3.2 oz (71.3 kg)    Fetal Status: Fetal Heart Rate (bpm): 139 Fundal Height: 26 cm Movement: Present     General:  Alert, oriented and cooperative. Patient is in no acute distress.  Skin: Skin is warm and dry.   Cardiovascular: Regular rate and rhythm.  Respiratory: Normal respiratory effort. CTA-Bilaterally  Abdomen: Soft, gravid, appropriate for gestational age.  Pelvic: Cervical exam deferred        Extremities: Normal range of motion.  Edema: None  Mental Status: Normal mood and affect. Normal behavior. Normal judgment and thought content.   Assessment and Plan:  Pregnancy: G3P2002 at [redacted]w[redacted]d  1. Supervision of other normal  pregnancy, antepartum -Anticipatory guidance for upcoming visits.  *Reviewed next appt in 4 weeks via Virtual Setting. -Patient was to have ECHO at [facility], but missed the appt. Patient states she will try to reschedule. -Patient requests and given circumcision information.  -Discussed labs collected today, turnover time for results, and procedure for follow up. - Glucose Tolerance, 2 Hours w/1 Hour - HIV Antibody (routine testing w rflx) - RPR - CBC - Tdap vaccine greater than or equal to 7yo IM  2. Need for tetanus, diphtheria, and acellular pertussis (Tdap) vaccine in patient of adolescent age or older -Given - Tdap vaccine greater than or equal to 7yo IM  3. Adjustment disorder with depressed mood -Patient reports that she has no feelings of depression. -Declines Behavioral health referral at current, but states will consider if having symptoms in the future.    Preterm labor symptoms and general obstetric precautions including but not limited to vaginal bleeding, contractions, leaking of fluid and fetal movement were reviewed with the patient.  Please refer to After Visit Summary for other counseling recommendations.  Return in about 4 weeks (around 12/31/2018) for ROB via Virtual Visit.  No future appointments.  Maryann Conners, CNM 12/03/2018, 8:35 AM

## 2018-12-03 NOTE — Patient Instructions (Addendum)
https://www.cdc.gov/vaccines/hcp/vis/vis-statements/tdap.pdf">  Tdap Vaccine (Tetanus, Diphtheria and Pertussis): What You Need to Know 1. Why get vaccinated? Tetanus, diphtheria and pertussis are very serious diseases. Tdap vaccine can protect us from these diseases. And, Tdap vaccine given to pregnant women can protect newborn babies against pertussis.Marland Kitchen. TETANUS (Lockjaw) is rare in the [place] today. It causes painful muscle tightening and stiffness, usually all over the body.  It can lead to tightening of muscles in the head and neck so you can't open your mouth, swallow, or sometimes even breathe. Tetanus kills about 1 out of 10 people who are infected even after receiving the best medical care. DIPHTHERIA is also rare in the [place] today. It can cause a thick coating to form in the back of the throat.  It can lead to breathing problems, heart failure, paralysis, and death. PERTUSSIS (Whooping Cough) causes severe coughing spells, which can cause difficulty breathing, vomiting and disturbed sleep.  It can also lead to weight loss, incontinence, and rib fractures. Up to 2 in 100 adolescents and 5 in 100 adults with pertussis are hospitalized or have complications, which could include pneumonia or death. These diseases are caused by bacteria. Diphtheria and pertussis are spread from person to person through secretions from coughing or sneezing. Tetanus enters the body through cuts, scratches, or wounds. Before vaccines, as many as 200,000 cases of diphtheria, 200,000 cases of pertussis, and hundreds of cases of tetanus, were reported in the [place] each year. Since vaccination began, reports of cases for tetanus and diphtheria have dropped by about 99% and for pertussis by about 80%. 2. Tdap vaccine Tdap vaccine can protect adolescents and adults from tetanus, diphtheria, and pertussis. One dose of Tdap is routinely given at age 22 or 2912. People who did not get Tdap at that  age should get it as soon as possible. Tdap is especially important for healthcare professionals and anyone having close contact with a baby younger than 12 months. Pregnant women should get a dose of Tdap during every pregnancy, to protect the newborn from pertussis. Infants are most at risk for severe, life-threatening complications from pertussis. Another vaccine, called Td, protects against tetanus and diphtheria, but not pertussis. A Td booster should be given every 10 years. Tdap may be given as one of these boosters if you have never gotten Tdap before. Tdap may also be given after a severe cut or burn to prevent tetanus infection. Your doctor or the person giving you the vaccine can give you more information. Tdap may safely be given at the same time as other vaccines. 3. Some people should not get this vaccine  A person who has ever had a life-threatening allergic reaction after a previous dose of any diphtheria, tetanus or pertussis containing vaccine, OR has a severe allergy to any part of this vaccine, should not get Tdap vaccine. Tell the person giving the vaccine about any severe allergies.  Anyone who had coma or long repeated seizures within 7 days after a childhood dose of DTP or DTaP, or a previous dose of Tdap, should not get Tdap, unless a cause other than the vaccine was found. They can still get Td.  Talk to your doctor if you: ? have seizures or another nervous system problem, ? had severe pain or swelling after any vaccine containing diphtheria, tetanus or pertussis, ? ever had a condition called Guillain-Barr Syndrome (GBS), ? aren't feeling well on the day the shot is scheduled. 4. Risks With any medicine, including  vaccines, there is a chance of side effects. These are usually mild and go away on their own. Serious reactions are also possible but are rare. Most people who get Tdap vaccine do not have any problems with it. Mild problems following Tdap (Did not  interfere with activities)  Pain where the shot was given (about 3 in 4 adolescents or 2 in 3 adults)  Redness or swelling where the shot was given (about 1 person in 5)  Mild fever of at least 100.72F (up to about 1 in 25 adolescents or 1 in 100 adults)  Headache (about 3 or 4 people in 10)  Tiredness (about 1 person in 3 or 4)  Nausea, vomiting, diarrhea, stomach ache (up to 1 in 4 adolescents or 1 in 10 adults)  Chills, sore joints (about 1 person in 10)  Body aches (about 1 person in 3 or 4)  Rash, swollen glands (uncommon) Moderate problems following Tdap (Interfered with activities, but did not require medical attention)  Pain where the shot was given (up to 1 in 5 or 6)  Redness or swelling where the shot was given (up to about 1 in 16 adolescents or 1 in 12 adults)  Fever over 102F (about 1 in 100 adolescents or 1 in 250 adults)  Headache (about 1 in 7 adolescents or 1 in 10 adults)  Nausea, vomiting, diarrhea, stomach ache (up to 1 or 3 people in 100)  Swelling of the entire arm where the shot was given (up to about 1 in 500). Severe problems following Tdap (Unable to perform usual activities; required medical attention)  Swelling, severe pain, bleeding and redness in the arm where the shot was given (rare). Problems that could happen after any vaccine:  People sometimes faint after a medical procedure, including vaccination. Sitting or lying down for about 15 minutes can help prevent fainting, and injuries caused by a fall. Tell your doctor if you feel dizzy, or have vision changes or ringing in the ears.  Some people get severe pain in the shoulder and have difficulty moving the arm where a shot was given. This happens very rarely.  Any medication can cause a severe allergic reaction. Such reactions from a vaccine are very rare, estimated at fewer than 1 in a million doses, and would happen within a few minutes to a few hours after the vaccination. As with any  medicine, there is a very remote chance of a vaccine causing a serious injury or death. The safety of vaccines is always being monitored. For more information, visit: http://floyd.org/ 5. What if there is a serious problem? What should I look for?  Look for anything that concerns you, such as signs of a severe allergic reaction, very high fever, or unusual behavior. Signs of a severe allergic reaction can include hives, swelling of the face and throat, difficulty breathing, a fast heartbeat, dizziness, and weakness. These would usually start a few minutes to a few hours after the vaccination. What should I do?  If you think it is a severe allergic reaction or other emergency that can't wait, call 9-1-1 or get the person to the nearest hospital. Otherwise, call your doctor.  Afterward, the reaction should be reported to the Vaccine Adverse Event Reporting System (VAERS). Your doctor might file this report, or you can do it yourself through the VAERS web site at www.vaers.LAgents.no, or by calling 1-(743)072-8047. VAERS does not give medical advice. 6. The National Vaccine Injury Compensation Program The [employer] Vaccine Injury Compensation  Program (VICP) is a Stage manager that was created to compensate people who may have been injured by certain vaccines. Persons who believe they may have been injured by a vaccine can learn about the program and about filing a claim by calling 1-970-748-6526 or visiting the VICP website at SpiritualWord.at. There is a time limit to file a claim for compensation. 7. How can I learn more?  Ask your doctor. He or she can give you the vaccine package insert or suggest other sources of information.  Call your local or state health department.  Contact the Centers for Disease Control and Prevention (CDC): ? Call 231-278-3687 (1-800-CDC-INFO) or ? Visit CDC's website at PicCapture.uy Vaccine Information Statement Tdap Vaccine  (03/24/2013) This information is not intended to replace advice given to you by your health care provider. Make sure you discuss any questions you have with your health care provider. Document Released: 07/17/2011 Document Revised: 09/02/2017 Document Reviewed: 09/02/2017 Elsevier Interactive Patient Education  2020 [employer].  Places to have your son circumcised:                                                                      [facility]     [phone] while you are in hospital         [facility]              [phone]   $269 by 4 wks                      [facility]                     [phone]   $269 by 7 days [facility]                    [phone]   $269 by 4 wks [facility]             [phone]   $225 by 2 wks    These prices sometimes change but are roughly what you can expect to pay. Please call and confirm pricing.   Circumcision is considered an elective/non-medically necessary procedure. There are many reasons parents decide to have their sons circumsized. During the first year of life circumcised males have a reduced risk of urinary tract infections but after this year the rates between circumcised males and uncircumcised males are the same.  It is safe to have your son circumcised outside of the hospital and the places above perform them regularly.   Deciding about Circumcision in Baby Boys  (Up-to-date The Basics)  What is circumcision?   Circumcision is a surgery that removes the skin that covers the tip of the penis, called the "foreskin" Circumcision is usually done when a boy is between 42 and 38 days old. In the [place], circumcision is common. In some other countries, fewer boys are circumcised. Circumcision is a common tradition in some religions.  Should I have my baby boy circumcised?   There is no easy answer. Circumcision has some benefits. But it also has risks. After talking with your doctor, you will have to decide for yourself what is right for your  family.  What are the benefits  of circumcision?   Circumcised boys seem to have slightly lower rates of: ?Urinary tract infections ?Swelling of the opening at the tip of the penis Circumcised men seem to have slightly lower rates of: ?Urinary tract infections ?Swelling of the opening at the tip of the penis ?Penis cancer ?HIV and other infections that you catch during sex ?Cervical cancer in the women they have sex with Even so, in the [place], the risks of these problems are small - even in boys and men who have not been circumcised. Plus, boys and men who are not circumcised can reduce these extra risks by: ?Cleaning their penis well ?Using condoms during sex  What are the risks of circumcision?  Risks include: ?Bleeding or infection from the surgery ?Damage to or amputation of the penis ?A chance that the doctor will cut off too much or not enough of the foreskin ?A chance that sex won't feel as good later in life Only about 1 out of every 200 circumcisions leads to problems. There is also a chance that your health insurance won't pay for circumcision.  How is circumcision done in baby boys?  First, the baby gets medicine for pain relief. This might be a cream on the skin or a shot into the base of the penis. Next, the doctor cleans the baby's penis well. Then he or she uses special tools to cut off the foreskin. Finally, the doctor wraps a bandage (called gauze) around the baby's penis. If you have your baby circumcised, his doctor or nurse will give you instructions on how to care for him after the surgery. It is important that you follow those instructions carefully.

## 2018-12-04 ENCOUNTER — Encounter: Payer: Self-pay | Admitting: General Practice

## 2018-12-04 LAB — CBC
Hematocrit: 35 % (ref 34.0–46.6)
Hemoglobin: 11.6 g/dL (ref 11.1–15.9)
MCH: 29.4 pg (ref 26.6–33.0)
MCHC: 33.1 g/dL (ref 31.5–35.7)
MCV: 89 fL (ref 79–97)
Platelets: 205 10*3/uL (ref 150–450)
RBC: 3.94 x10E6/uL (ref 3.77–5.28)
RDW: 12.5 % (ref 11.7–15.4)
WBC: 7.1 10*3/uL (ref 3.4–10.8)

## 2018-12-04 LAB — GLUCOSE TOLERANCE, 2 HOURS W/ 1HR
Glucose, 1 hour: 112 mg/dL (ref 65–179)
Glucose, 2 hour: 70 mg/dL (ref 65–152)
Glucose, Fasting: 103 mg/dL — ABNORMAL HIGH (ref 65–91)

## 2018-12-04 LAB — HIV ANTIBODY (ROUTINE TESTING W REFLEX): HIV Screen 4th Generation wRfx: NONREACTIVE

## 2018-12-04 LAB — RPR: RPR Ser Ql: NONREACTIVE

## 2018-12-11 ENCOUNTER — Ambulatory Visit (HOSPITAL_COMMUNITY)
Admission: EM | Admit: 2018-12-11 | Discharge: 2018-12-11 | Disposition: A | Discharge status: Home / Self Care | Payer: Medicaid Other | Attending: Internal Medicine | Admitting: Internal Medicine

## 2018-12-11 ENCOUNTER — Other Ambulatory Visit: Payer: Self-pay

## 2018-12-11 ENCOUNTER — Encounter (HOSPITAL_COMMUNITY): Payer: Self-pay | Admitting: Emergency Medicine

## 2018-12-11 DIAGNOSIS — M79652 Pain in left thigh: Secondary | ICD-10-CM | POA: Diagnosis not present

## 2018-12-11 MED ORDER — ACETAMINOPHEN 325 MG PO TABS: 650.0000 mg | ORAL_TABLET | Freq: Four times a day (QID) | ORAL | Status: DC | PRN

## 2018-12-11 NOTE — ED Triage Notes (Addendum)
pain in left upper leg.  Patient describes a knot in thigh.  Denies fever.  Patient reports the knot is a new issue.  Patient has bilateral thigh pain, randomly and ongoing .  Patient says knot in left leg is very painful.  Patient denies a fall

## 2018-12-11 NOTE — ED Provider Notes (Signed)
[name]    CSN: [id number] Arrival date & time: 12/11/18  1450      History   Chief Complaint Chief Complaint  Patient presents with  . Leg Pain    HPI Jaclyn Owens is a 23 y.o. female with a history of asthma and currently about [redacted] weeks pregnant comes to urgent care with complaints of left thigh pain which started a few days ago.  Patient says that the pain is throbbing and constant.  It is aggravated by palpation.  There is no relieving factors.  No fever or chills.  No trauma to the left leg.  No calf pain or swelling.  No change in shortness of breath.  No chest pain or chest pressure. HPI  Past Medical History:  Diagnosis Date  . Asthma   . HSV (herpes simplex virus) infection     Patient Active Problem List   Diagnosis Date Noted  . Short interval between pregnancies affecting pregnancy, antepartum 08/21/2018  . Supervision of other normal pregnancy, antepartum 08/29/2017  . HSV-2 infection complicating pregnancy 32/35/5732  . Adjustment disorder with depressed mood 02/24/2017    Past Surgical History:  Procedure Laterality Date  . ARM WOUND REPAIR / CLOSURE      OB History    Gravida  3   Para  2   Term  2   Preterm  0   AB  0   Living  2     SAB  0   TAB  0   Ectopic  0   Multiple  0   Live Births  2            Home Medications    Prior to Admission medications   Medication Sig Start Date End Date Taking? Authorizing Provider  Prenatal Vit-Fe Phos-FA-Omega (VITAFOL GUMMIES) 3.33-0.333-34.8 MG CHEW Chew 3 each by mouth daily. 07/16/18  Yes Laury Deep, CNM  acetaminophen (TYLENOL) 325 MG tablet Take 2 tablets (650 mg total) by mouth every 6 (six) hours as needed. 12/11/18   LampteyMyrene Galas, MD    Family History Family History  Problem Relation Age of Onset  . Asthma Mother   . Diabetes Maternal Aunt   . Cancer Maternal Grandmother     Social History Social History   Tobacco Use  . Smoking status:  Former Smoker    Types: Cigarettes, Cigars  . Smokeless tobacco: Never Used  Substance Use Topics  . Alcohol use: Not Currently  . Drug use: No    Types: Marijuana    Comment: MJ use 01-24-15     Allergies   Bee venom   Review of Systems Review of Systems  HENT: Negative.   Respiratory: Positive for shortness of breath. Negative for apnea, cough, chest tightness and wheezing.   Gastrointestinal: Negative.   Genitourinary: Negative for dysuria, frequency, hematuria, urgency, vaginal bleeding and vaginal discharge.  Musculoskeletal: Positive for myalgias. Negative for arthralgias and back pain.  Skin: Negative.  Negative for rash and wound.  Neurological: Negative for dizziness, weakness, light-headedness, numbness and headaches.     Physical Exam Triage Vital Signs ED Triage Vitals  Enc Vitals Group     BP      Pulse      Resp      Temp      Temp src      SpO2      Weight      Height      Head Circumference  Peak Flow      Pain Score      Pain Loc      Pain Edu?      Excl. in GC?    No data found.  Updated Vital Signs BP 111/75 (BP Location: Left Arm)   Pulse 82   Temp 97.6 F (36.4 C) (Oral)   Resp 18   LMP 05/26/2018 (Exact Date)   SpO2 100%   Visual Acuity Right Eye Distance:   Left Eye Distance:   Bilateral Distance:    Right Eye Near:   Left Eye Near:    Bilateral Near:     Physical Exam Constitutional:      General: She is in acute distress.     Appearance: She is not ill-appearing or toxic-appearing.  HENT:     Head: Normocephalic and atraumatic.  Cardiovascular:     Rate and Rhythm: Normal rate and regular rhythm.     Pulses: Normal pulses.     Heart sounds: Normal heart sounds. No murmur. No friction rub.  Pulmonary:     Effort: Pulmonary effort is normal.     Breath sounds: Normal breath sounds. No wheezing, rhonchi or rales.  Abdominal:     General: Bowel sounds are normal. There is distension.     Palpations: Abdomen is  soft.     Tenderness: There is no guarding or rebound.  Musculoskeletal: Normal range of motion.  Skin:    General: Skin is warm.     Capillary Refill: Capillary refill takes less than 2 seconds.     Findings: No bruising or erythema.     Comments: Cordlike tenderness over the medial part of the left thigh.  It is about 2 inches in length.  Mild overlying erythema.  Neurological:     General: No focal deficit present.     Mental Status: She is alert and oriented to person, place, and time.      UC Treatments / Results  Labs (all labs ordered are listed, but only abnormal results are displayed) Labs Reviewed - No data to display  EKG   Radiology No results found.  Procedures Procedures (including critical care time)  Medications Ordered in UC Medications - No data to display  Initial Impression / Assessment and Plan / UC Course  I have reviewed the triage vital signs and the nursing notes.  Pertinent labs & imaging results that were available during my care of the patient were reviewed by me and considered in my medical decision making (see chart for details).     1.  Left thigh pain (suspect superficial thrombophlebitis): DVT screen to rule out deep vein thrombosis Tylenol as needed for pain. Patient is not tachycardic pulse oximetry is 100%.  Patient is not tachypneic beyond her baseline. Hopefully she will get the DVT screen done tomorrow. Warm compress over the area of tenderness. If patient develops worsening shortness of breath or worsening type pain she is advised to go to the emergency department to be evaluated. Final Clinical Impressions(s) / UC Diagnoses   Final diagnoses:  Left thigh pain   Discharge Instructions   None    ED Prescriptions    Medication Sig Dispense Auth. Provider   acetaminophen (TYLENOL) 325 MG tablet Take 2 tablets (650 mg total) by mouth every 6 (six) hours as needed.  Lamptey, Britta Mccreedy, MD     PDMP not reviewed this  encounter.   Merrilee Jansky, MD 12/11/18 [id number]

## 2018-12-11 NOTE — ED Notes (Signed)
Pt answered her phone as I walked her to room 2.  She has asked for a moment.  I will return when the pt lets me know she is done with her phone call.

## 2018-12-15 ENCOUNTER — Encounter (HOSPITAL_COMMUNITY): Payer: Self-pay | Admitting: Maternal & Fetal Medicine

## 2018-12-15 ENCOUNTER — Telehealth (HOSPITAL_COMMUNITY): Payer: Self-pay | Admitting: Emergency Medicine

## 2018-12-15 ENCOUNTER — Telehealth: Payer: Self-pay | Admitting: Family Medicine

## 2018-12-15 ENCOUNTER — Other Ambulatory Visit: Payer: Self-pay

## 2018-12-15 ENCOUNTER — Ambulatory Visit (HOSPITAL_COMMUNITY)
Admission: RE | Admit: 2018-12-15 | Discharge: 2018-12-15 | Disposition: A | Discharge status: Home / Self Care | Payer: Medicaid Other | Source: Ambulatory Visit | Attending: Internal Medicine | Admitting: Internal Medicine

## 2018-12-15 DIAGNOSIS — O368390 Maternal care for abnormalities of the fetal heart rate or rhythm, unspecified trimester, not applicable or unspecified: Secondary | ICD-10-CM | POA: Diagnosis not present

## 2018-12-15 DIAGNOSIS — I82812 Embolism and thrombosis of superficial veins of left lower extremities: Secondary | ICD-10-CM | POA: Insufficient documentation

## 2018-12-15 DIAGNOSIS — M79652 Pain in left thigh: Secondary | ICD-10-CM | POA: Diagnosis not present

## 2018-12-15 DIAGNOSIS — M79609 Pain in unspecified limb: Secondary | ICD-10-CM | POA: Diagnosis not present

## 2018-12-15 DIAGNOSIS — O358XX0 Maternal care for other (suspected) fetal abnormality and damage, not applicable or unspecified: Secondary | ICD-10-CM | POA: Diagnosis not present

## 2018-12-15 NOTE — Telephone Encounter (Signed)
Patient contacted and made aware of  DVT study  results. Pt verbalized understanding and had all questions answered.

## 2018-12-15 NOTE — Telephone Encounter (Signed)
Spoke to patient about her appointment on 11/17 @ 9:15. Patient instructed to wear a face mask for the entire appointment and no visitors are allowed with her during the visit. Patient screened for covid symptoms and denied having any °

## 2018-12-15 NOTE — Progress Notes (Signed)
LLE venous duplex       has been completed. Preliminary results can be found under CV proc through chart review. Jourden Gilson, BS, RDMS, RVT    

## 2018-12-16 ENCOUNTER — Encounter: Payer: Medicaid Other | Attending: Obstetrics and Gynecology | Admitting: *Deleted

## 2018-12-16 ENCOUNTER — Other Ambulatory Visit: Payer: Self-pay

## 2018-12-16 ENCOUNTER — Ambulatory Visit: Payer: Medicaid Other | Admitting: *Deleted

## 2018-12-16 DIAGNOSIS — Z713 Dietary counseling and surveillance: Secondary | ICD-10-CM | POA: Insufficient documentation

## 2018-12-16 DIAGNOSIS — O24419 Gestational diabetes mellitus in pregnancy, unspecified control: Secondary | ICD-10-CM | POA: Diagnosis not present

## 2018-12-16 DIAGNOSIS — Z3A00 Weeks of gestation of pregnancy not specified: Secondary | ICD-10-CM | POA: Insufficient documentation

## 2018-12-16 DIAGNOSIS — O24410 Gestational diabetes mellitus in pregnancy, diet controlled: Secondary | ICD-10-CM

## 2018-12-16 MED ORDER — ACCU-CHEK FASTCLIX LANCETS MISC: 1.0000 | Freq: Four times a day (QID) | 12 refills | Status: DC

## 2018-12-16 MED ORDER — ACCU-CHEK GUIDE W/DEVICE KIT: 1.0000 | PACK | Freq: Once | 0 refills | Status: AC

## 2018-12-16 MED ORDER — ACCU-CHEK GUIDE VI STRP: ORAL_STRIP | 12 refills | Status: DC

## 2018-12-16 NOTE — Progress Notes (Signed)
  Patient was seen on 12/16/2018 for Gestational Diabetes self-management. EDD 03/09/2019. Patient states no history of GDM. Patient states she is home during the day, she does not work. Diet history obtained. Patient eats fair variety of all food groups with first meal being around noon, dinner at night and snacking on sandwiches, hot dogs, cookies, pop tarts throughout the day. Beverages include OJ, water, regular soda and milk.  The following learning objectives were met by the patient :   States the definition of Gestational Diabetes  States why dietary management is important in controlling blood glucose  Describes the effects of carbohydrates on blood glucose levels  Demonstrates ability to create a balanced meal plan  Demonstrates carbohydrate counting   States when to check blood glucose levels  Demonstrates proper blood glucose monitoring techniques  States the effect of stress and exercise on blood glucose levels  States the importance of limiting caffeine and abstaining from alcohol and smoking  Plan:  Aim for 3 Carb Choices per meal (45 grams) +/- 1 either way  Aim for 1-2 Carbs per snack Begin reading food labels for Total Carbohydrate of foods If OK with your MD, consider  increasing your activity level by walking, Arm Chair Exercises or other activity daily as tolerated Begin checking BG before breakfast and 2 hours after first bite of breakfast, lunch and dinner as directed by MD  Bring Log Book/Sheet and meter to every medical appointment OR use Baby Scripts (see below) Baby Scripts:  Patient was introduced to [employer] and plans to use as record of BG electronically Take medication if directed by MD  Blood glucose monitor Rx called into pharmacy: Accu Check Guide with Fast Clix drums Patient instructed to test pre breakfast and 2 hours each meal as directed by MD  Patient instructed to monitor glucose levels: FBS: 60 - 95 mg/dl 2 hour: <120 mg/dl  Patient  received the following handouts:  Nutrition Diabetes and Pregnancy  Carbohydrate Counting List  Patient will be seen for follow-up as needed.

## 2018-12-27 ENCOUNTER — Emergency Department (HOSPITAL_COMMUNITY)
Admission: EM | Admit: 2018-12-27 | Discharge: 2018-12-27 | Disposition: A | Discharge status: Home / Self Care | Payer: Medicaid Other | Attending: Emergency Medicine | Admitting: Emergency Medicine

## 2018-12-27 ENCOUNTER — Encounter (HOSPITAL_COMMUNITY): Payer: Self-pay | Admitting: Emergency Medicine

## 2018-12-27 ENCOUNTER — Other Ambulatory Visit: Payer: Self-pay

## 2018-12-27 DIAGNOSIS — H60502 Unspecified acute noninfective otitis externa, left ear: Secondary | ICD-10-CM | POA: Diagnosis not present

## 2018-12-27 DIAGNOSIS — Z87891 Personal history of nicotine dependence: Secondary | ICD-10-CM | POA: Insufficient documentation

## 2018-12-27 DIAGNOSIS — Z79899 Other long term (current) drug therapy: Secondary | ICD-10-CM | POA: Diagnosis not present

## 2018-12-27 DIAGNOSIS — H66015 Acute suppurative otitis media with spontaneous rupture of ear drum, recurrent, left ear: Secondary | ICD-10-CM | POA: Insufficient documentation

## 2018-12-27 DIAGNOSIS — H66002 Acute suppurative otitis media without spontaneous rupture of ear drum, left ear: Secondary | ICD-10-CM | POA: Diagnosis not present

## 2018-12-27 DIAGNOSIS — H9202 Otalgia, left ear: Secondary | ICD-10-CM | POA: Diagnosis present

## 2018-12-27 MED ORDER — AMOXICILLIN-POT CLAVULANATE 875-125 MG PO TABS
1.0000 | ORAL_TABLET | Freq: Once | ORAL | Status: AC
  Administered 2018-12-27: 1 via ORAL
  Filled 2018-12-27: qty 1

## 2018-12-27 MED ORDER — AMOXICILLIN-POT CLAVULANATE 875-125 MG PO TABS: 1.0000 | ORAL_TABLET | Freq: Two times a day (BID) | ORAL | 0 refills | Status: DC

## 2018-12-27 MED ORDER — ACETAMINOPHEN 500 MG PO TABS
1000.0000 mg | ORAL_TABLET | Freq: Once | ORAL | Status: AC
  Administered 2018-12-27: 21:00:00 1000 mg via ORAL
  Filled 2018-12-27: qty 2

## 2018-12-27 NOTE — ED Triage Notes (Signed)
Per EMS-Patient c/o left ear ache x 3 days. Patient states she is [redacted] weeks pregnant

## 2018-12-27 NOTE — Discharge Instructions (Addendum)
You need to follow-up closely with the ENT provided.  You may take Tylenol as needed for fever and pain.

## 2018-12-27 NOTE — ED Provider Notes (Signed)
[facility] DEPT Provider Note   CSN: [id number] Arrival date & time: 12/27/18  [place]     History   Chief Complaint Chief Complaint  Patient presents with  . Otalgia    HPI Jaclyn Owens is a 23 y.o. female.     HPI Patient with recurrent left otitis media presents with 3 days of left ear pain.  Was seen in September and diagnosed with acute otitis media with TM perforation.  Was treated with antibiotics at that time and advised to follow-up with ENT.  Patient did not see ENT.  States for the last 3 days she has had progressive left ear pain.  States he has had decreased hearing and some drainage from the ear.  No fever or chills.  No pain posterior to the ear.  Denies any pregnancy related symptoms. Past Medical History:  Diagnosis Date  . Asthma   . HSV (herpes simplex virus) infection     Patient Active Problem List   Diagnosis Date Noted  . Short interval between pregnancies affecting pregnancy, antepartum 08/21/2018  . Supervision of other normal pregnancy, antepartum 08/29/2017  . HSV-2 infection complicating pregnancy 61/95/0932  . Adjustment disorder with depressed mood 02/24/2017    Past Surgical History:  Procedure Laterality Date  . ARM WOUND REPAIR / CLOSURE       OB History    Gravida  3   Para  2   Term  2   Preterm  0   AB  0   Living  2     SAB  0   TAB  0   Ectopic  0   Multiple  0   Live Births  2            Home Medications    Prior to Admission medications   Medication Sig Start Date End Date Taking? Authorizing Provider  Accu-Chek FastClix Lancets MISC 1 Device by Percutaneous route 4 (four) times daily. 12/16/18   Donnamae Jude, MD  acetaminophen (TYLENOL) 325 MG tablet Take 2 tablets (650 mg total) by mouth every 6 (six) hours as needed. 12/11/18   Chase Picket, MD  glucose blood (ACCU-CHEK GUIDE) test strip Use as instructed QID 12/16/18   Donnamae Jude, MD  Prenatal Vit-Fe  Phos-FA-Omega (VITAFOL GUMMIES) 3.33-0.333-34.8 MG CHEW Chew 3 each by mouth daily. 07/16/18   Laury Deep, CNM    Family History Family History  Problem Relation Age of Onset  . Asthma Mother   . Diabetes Maternal Aunt   . Cancer Maternal Grandmother     Social History Social History   Tobacco Use  . Smoking status: Former Smoker    Types: Cigarettes, Cigars  . Smokeless tobacco: Never Used  Substance Use Topics  . Alcohol use: Not Currently  . Drug use: No    Types: Marijuana    Comment: MJ use 01-24-15     Allergies   Bee venom   Review of Systems Review of Systems  Constitutional: Negative for chills and fever.  HENT: Positive for ear discharge, ear pain and hearing loss. Negative for sore throat.   Eyes: Negative for visual disturbance.  Respiratory: Negative for shortness of breath.   Cardiovascular: Negative for chest pain.  Gastrointestinal: Negative for abdominal pain and nausea.  Genitourinary: Negative for pelvic pain and vaginal bleeding.  Musculoskeletal: Negative for back pain, myalgias and neck pain.  Skin: Negative for rash and wound.  Neurological: Negative for syncope, weakness, light-headedness, numbness  and headaches.  All other systems reviewed and are negative.    Physical Exam Updated Vital Signs BP 107/70 (BP Location: Left Arm)   Pulse (!) 104   Temp 98.1 F (36.7 C) (Oral)   Resp 16   Ht 5\' 6"  (1.676 m)   Wt 72.6 kg   LMP 05/26/2018 (Exact Date)   SpO2 99% Comment: Simultaneous filing. User may not have seen previous data.  BMI 25.82 kg/m   Physical Exam Vitals signs and nursing note reviewed.  Constitutional:      General: She is not in acute distress.    Appearance: Normal appearance. She is well-developed. She is not ill-appearing.  HENT:     Head: Normocephalic and atraumatic.     Comments: Patient with tenderness to manipulation of the left external ear.  There is no posterior auricular erythema or tenderness to  percussion.  Patient does have some swelling to the external canal and small amount of white discharge.  Difficult to visualize tympanic membrane. Eyes:     Pupils: Pupils are equal, round, and reactive to light.  Neck:     Musculoskeletal: Normal range of motion and neck supple. Muscular tenderness present. No neck rigidity.     Comments: Patient with left anterior cervical lymphadenopathy. Cardiovascular:     Rate and Rhythm: Normal rate and regular rhythm.  Pulmonary:     Effort: Pulmonary effort is normal.     Breath sounds: Normal breath sounds.  Abdominal:     General: Bowel sounds are normal.     Palpations: Abdomen is soft.     Tenderness: There is no abdominal tenderness. There is no guarding or rebound.  Musculoskeletal: Normal range of motion.        General: No tenderness.  Skin:    General: Skin is warm and dry.     Findings: No erythema or rash.  Neurological:     General: No focal deficit present.     Mental Status: She is alert and oriented to person, place, and time.  Psychiatric:        Behavior: Behavior normal.      ED Treatments / Results  Labs (all labs ordered are listed, but only abnormal results are displayed) Labs Reviewed - No data to display  EKG None  Radiology No results found.  Procedures Procedures (including critical care time)  Medications Ordered in ED Medications  acetaminophen (TYLENOL) tablet 1,000 mg (has no administration in time range)  amoxicillin-clavulanate (AUGMENTIN) 875-125 MG per tablet 1 tablet (has no administration in time range)     Initial Impression / Assessment and Plan / ED Course  I have reviewed the triage vital signs and the nursing notes.  Pertinent labs & imaging results that were available during my care of the patient were reviewed by me and considered in my medical decision making (see chart for details).        Concern for left otitis externa/media.  Possible ruptured TM.  No evidence of  mastoiditis.  Will start on oral antibiotic.  Given first dose in the emergency department.  Have stressed with patient the need to follow-up closely with an ENT on an outpatient basis.  Return precautions given.  Final Clinical Impressions(s) / ED Diagnoses   Final diagnoses:  Recurrent acute suppurative otitis media with spontaneous rupture of left tympanic membrane  Acute otitis externa of left ear, unspecified type    ED Discharge Orders    None       Tessica Cupo,  Onalee Huaavid, MD 12/27/18 2035

## 2018-12-27 NOTE — ED Notes (Signed)
Pt was verbalized discharge instructions. Pt had no further questions at this time. NAD. 

## 2018-12-31 DIAGNOSIS — O24419 Gestational diabetes mellitus in pregnancy, unspecified control: Secondary | ICD-10-CM | POA: Insufficient documentation

## 2019-01-01 ENCOUNTER — Other Ambulatory Visit: Payer: Self-pay

## 2019-01-01 ENCOUNTER — Encounter: Payer: Self-pay | Admitting: Obstetrics and Gynecology

## 2019-01-01 ENCOUNTER — Telehealth (INDEPENDENT_AMBULATORY_CARE_PROVIDER_SITE_OTHER): Payer: Medicaid Other | Admitting: Obstetrics and Gynecology

## 2019-01-01 DIAGNOSIS — O09893 Supervision of other high risk pregnancies, third trimester: Secondary | ICD-10-CM

## 2019-01-01 DIAGNOSIS — Z3A30 30 weeks gestation of pregnancy: Secondary | ICD-10-CM

## 2019-01-01 DIAGNOSIS — O368390 Maternal care for abnormalities of the fetal heart rate or rhythm, unspecified trimester, not applicable or unspecified: Secondary | ICD-10-CM

## 2019-01-01 DIAGNOSIS — O4703 False labor before 37 completed weeks of gestation, third trimester: Secondary | ICD-10-CM

## 2019-01-01 DIAGNOSIS — O24415 Gestational diabetes mellitus in pregnancy, controlled by oral hypoglycemic drugs: Secondary | ICD-10-CM

## 2019-01-01 DIAGNOSIS — O24410 Gestational diabetes mellitus in pregnancy, diet controlled: Secondary | ICD-10-CM

## 2019-01-01 DIAGNOSIS — O0993 Supervision of high risk pregnancy, unspecified, third trimester: Secondary | ICD-10-CM

## 2019-01-01 DIAGNOSIS — O09899 Supervision of other high risk pregnancies, unspecified trimester: Secondary | ICD-10-CM

## 2019-01-01 MED ORDER — METFORMIN HCL ER 500 MG PO TB24: 500.0000 mg | ORAL_TABLET | Freq: Every day | ORAL | 6 refills | Status: DC

## 2019-01-01 NOTE — Progress Notes (Signed)
MY CHART VIDEO VIRTUAL OBSTETRICS VISIT ENCOUNTER NOTE  I connected with Jaclyn Owens on 01/01/19 at 10:30 AM EST by My Chart video at home and verified that I am speaking with the correct person using two identifiers.   I discussed the limitations, risks, security and privacy concerns of performing an evaluation and management service by My Chart video and the availability of in person appointments. I also discussed with the patient that there may be a patient responsible charge related to this service. The patient expressed understanding and agreed to proceed.  Subjective:  Jaclyn Owens is a 23 y.o. G3P2002 at [redacted]w[redacted]d being followed for ongoing prenatal care.  She is currently monitored for the following issues for this high-risk pregnancy and has Adjustment disorder with depressed mood; Supervision of other normal pregnancy, antepartum; HSV-2 infection complicating pregnancy; Short interval between pregnancies affecting pregnancy, antepartum; and Gestational diabetes on their problem list.  Patient reports backache, contractions since last week and increased vaginal pressure. She is unsure if they are Braxton-Hicks contractions or not. She was going to go to MAU last night, but didn't want to wake her grandmother at 0200 in the morning. She reports she is still hurting now during this video call. She reports she eats "whatever she feels like eating at the time." She states she is "trying" to eat what she was advised to eat. She states she had waffles one morning for breakfast and had lower blood sugar. She then ate the same thing on a different day and blood sugars were elevated. She is "not understanding" blood sugars. Reports fetal movement. Denies any contractions, bleeding or leaking of fluid.   The following portions of the patient's history were reviewed and updated as appropriate: allergies, current medications, past family history, past medical history, past social history, past surgical  history and problem list.   Objective:   General:  Alert, oriented and cooperative.   Mental Status: Normal mood and affect perceived. Normal judgment and thought content.  Rest of physical exam deferred due to type of encounter  LMP 05/26/2018 (Exact Date)  **Patient does not know how to operate BP cuff and she does not have batteries  Assessment and Plan:  Pregnancy: G3P2002 at [redacted]w[redacted]d  1. Gestational diabetes mellitus (GDM) treated with oral hypoglycemic therapy  - Poor control of fasting blood sugar levels. Advised that high fasting blood sugars means that her body is not metabolizing whatever she ate at dinner or snack before bed - Advised will need to start on oral agent to lower fasting blood sugars - Rx for metFORMIN (GLUCOPHAGE XR) 500 MG 24 hr tablet - Discussed better food choices, select foods from diabetes educator information provided - [place] [facility] OB FOLLOW UP; Future  2. Pregnancy, supervision, high-risk, third trimester - Consider transferring care to [facility] or [facility] for HROB care.  3. Threatened preterm labor, third trimester - Advised to go to MAU for evaluation while it is daytime and she has someone to babysit her children - Patient assures she will go for evaluation, because she is still contracting   Preterm labor symptoms and general obstetric precautions including but not limited to vaginal bleeding, contractions, leaking of fluid and fetal movement were reviewed in detail with the patient.  I discussed the assessment and treatment plan with the patient. The patient was provided an opportunity to ask questions and all were answered. The patient agreed with the plan and demonstrated an understanding of the instructions. The patient was advised to call back  or seek an in-person office evaluation/go to [facility] at [facility] for any urgent or concerning symptoms. Please refer to After Visit Summary for other counseling recommendations.   I provided 10 minutes  of non-face-to-face time during this encounter. There was 5 minutes of chart review time spent prior to this encounter. Total time spent = 15 minutes.  Return in about 2 weeks (around 01/15/2019) for Return OB - My Chart video.  Future Appointments  Date Time Provider Elaine  01/15/2019  1:50 PM Laury Deep, CNM [facility] None  02/12/2019 10:50 AM Laury Deep, CNM [facility] None    Laury Deep, [place] for [employer], [place]

## 2019-01-01 NOTE — Patient Instructions (Signed)
Metformin extended-release tablets What is this medicine? METFORMIN (met FOR min) is used to treat type 2 diabetes. It helps to control blood sugar. Treatment is combined with diet and exercise. This medicine can be used alone or with other medicines for diabetes. This medicine may be used for other purposes; ask your health care provider or pharmacist if you have questions. COMMON BRAND NAME(S): Fortamet, Glucophage XR, Glumetza What should I tell my health care provider before I take this medicine? They need to know if you have any of these conditions:  anemia  dehydration  heart disease  frequently drink alcohol-containing beverages  kidney disease  liver disease  polycystic ovary syndrome  serious infection or injury  vomiting  an unusual or allergic reaction to metformin, other medicines, foods, dyes, or preservatives  pregnant or trying to get pregnant  breast-feeding How should I use this medicine? Take this medicine by mouth with a glass of water. Follow the directions on the prescription label. Take this medicine with food. Take your medicine at regular intervals. Do not take your medicine more often than directed. Do not stop taking except on your doctor's advice. Talk to your pediatrician regarding the use of this medicine in children. Special care may be needed. Overdosage: If you think you have taken too much of this medicine contact a poison control center or emergency room at once. NOTE: This medicine is only for you. Do not share this medicine with others. What if I miss a dose? If you miss a dose, take it as soon as you can. If it is almost time for your next dose, take only that dose. Do not take double or extra doses. What may interact with this medicine? Do not take this medicine with any of the following medications:  certain contrast medicines given before X-rays, CT scans, MRI, or other procedures  dofetilide This medicine may also interact with the  following medications:  acetazolamide  alcohol  certain antivirals for HIV or hepatitis  certain medicines for blood pressure, heart disease, irregular heart beat  cimetidine  dichlorphenamide  digoxin  diuretics  female hormones, like estrogens or progestins and birth control pills  glycopyrrolate  isoniazid  lamotrigine  memantine  methazolamide  metoclopramide  midodrine  niacin  phenothiazines like chlorpromazine, mesoridazine, prochlorperazine, thioridazine  phenytoin  ranolazine  steroid medicines like prednisone or cortisone  stimulant medicines for attention disorders, weight loss, or to stay awake  thyroid medicines  topiramate  trospium  vandetanib  zonisamide This list may not describe all possible interactions. Give your health care provider a list of all the medicines, herbs, non-prescription drugs, or dietary supplements you use. Also tell them if you smoke, drink alcohol, or use illegal drugs. Some items may interact with your medicine. What should I watch for while using this medicine? Visit your doctor or health care professional for regular checks on your progress. A test called the HbA1C (A1C) will be monitored. This is a simple blood test. It measures your blood sugar control over the last 2 to 3 months. You will receive this test every 3 to 6 months. Learn how to check your blood sugar. Learn the symptoms of low and high blood sugar and how to manage them. Always carry a quick-source of sugar with you in case you have symptoms of low blood sugar. Examples include hard sugar candy or glucose tablets. Make sure others know that you can choke if you eat or drink when you develop serious symptoms of low   blood sugar, such as seizures or unconsciousness. They must get medical help at once. Tell your doctor or health care professional if you have high blood sugar. You might need to change the dose of your medicine. If you are sick or  exercising more than usual, you might need to change the dose of your medicine. Do not skip meals. Ask your doctor or health care professional if you should avoid alcohol. Many nonprescription cough and cold products contain sugar or alcohol. These can affect blood sugar. This medicine may cause ovulation in premenopausal women who do not have regular monthly periods. This may increase your chances of becoming pregnant. You should not take this medicine if you become pregnant or think you may be pregnant. Talk with your doctor or health care professional about your birth control options while taking this medicine. Contact your doctor or health care professional right away if you think you are pregnant. The tablet shell for some brands of this medicine does not dissolve. This is normal. The tablet shell may appear whole in the stool. This is not a cause for concern. If you are going to need surgery, a MRI, CT scan, or other procedure, tell your doctor that you are taking this medicine. You may need to stop taking this medicine before the procedure. Wear a medical ID bracelet or chain, and carry a card that describes your disease and details of your medicine and dosage times. This medicine may cause a decrease in folic acid and vitamin B12. You should make sure that you get enough vitamins while you are taking this medicine. Discuss the foods you eat and the vitamins you take with your health care professional. What side effects may I notice from receiving this medicine? Side effects that you should report to your doctor or health care professional as soon as possible:  allergic reactions like skin rash, itching or hives, swelling of the face, lips, or tongue  breathing problems  feeling faint or lightheaded, falls  muscle aches or pains  signs and symptoms of low blood sugar such as feeling anxious, confusion, dizziness, increased hunger, unusually weak or tired, sweating, shakiness, cold,  irritable, headache, blurred vision, fast heartbeat, loss of consciousness  slow or irregular heartbeat  unusual stomach pain or discomfort  unusually tired or weak Side effects that usually do not require medical attention (report to your doctor or health care professional if they continue or are bothersome):  diarrhea  headache  heartburn  metallic taste in mouth  nausea  stomach gas, upset This list may not describe all possible side effects. Call your doctor for medical advice about side effects. You may report side effects to FDA at 1-800-FDA-1088. Where should I keep my medicine? Keep out of the reach of children. Store at room temperature between 15 and 30 degrees C (59 and 86 degrees F). Protect from light. Throw away any unused medicine after the expiration date. NOTE: This sheet is a summary. It may not cover all possible information. If you have questions about this medicine, talk to your doctor, pharmacist, or health care provider.  2020 Elsevier/Gold Standard (2017-02-21 18:58:32)

## 2019-01-07 ENCOUNTER — Other Ambulatory Visit: Payer: Self-pay | Admitting: *Deleted

## 2019-01-07 DIAGNOSIS — O24419 Gestational diabetes mellitus in pregnancy, unspecified control: Secondary | ICD-10-CM

## 2019-01-08 ENCOUNTER — Other Ambulatory Visit: Payer: Self-pay

## 2019-01-09 ENCOUNTER — Other Ambulatory Visit: Payer: Self-pay

## 2019-01-09 ENCOUNTER — Ambulatory Visit (HOSPITAL_COMMUNITY)
Admission: RE | Admit: 2019-01-09 | Discharge: 2019-01-09 | Disposition: A | Discharge status: Home / Self Care | Payer: Medicaid Other | Source: Ambulatory Visit | Attending: Obstetrics and Gynecology | Admitting: Obstetrics and Gynecology

## 2019-01-09 DIAGNOSIS — O24410 Gestational diabetes mellitus in pregnancy, diet controlled: Secondary | ICD-10-CM | POA: Insufficient documentation

## 2019-01-09 DIAGNOSIS — O09892 Supervision of other high risk pregnancies, second trimester: Secondary | ICD-10-CM | POA: Diagnosis not present

## 2019-01-09 DIAGNOSIS — Z362 Encounter for other antenatal screening follow-up: Secondary | ICD-10-CM | POA: Diagnosis not present

## 2019-01-09 DIAGNOSIS — Z3A31 31 weeks gestation of pregnancy: Secondary | ICD-10-CM

## 2019-01-09 DIAGNOSIS — O24415 Gestational diabetes mellitus in pregnancy, controlled by oral hypoglycemic drugs: Secondary | ICD-10-CM | POA: Diagnosis not present

## 2019-01-12 ENCOUNTER — Other Ambulatory Visit (HOSPITAL_COMMUNITY): Payer: Self-pay | Admitting: *Deleted

## 2019-01-12 DIAGNOSIS — O24415 Gestational diabetes mellitus in pregnancy, controlled by oral hypoglycemic drugs: Secondary | ICD-10-CM

## 2019-01-15 ENCOUNTER — Encounter: Payer: Medicaid Other | Admitting: Obstetrics and Gynecology

## 2019-01-16 ENCOUNTER — Encounter: Payer: Self-pay | Admitting: Family Medicine

## 2019-01-19 ENCOUNTER — Encounter: Payer: Self-pay | Admitting: General Practice

## 2019-01-22 ENCOUNTER — Ambulatory Visit (HOSPITAL_COMMUNITY): Payer: Medicaid Other | Admitting: *Deleted

## 2019-01-22 ENCOUNTER — Ambulatory Visit (HOSPITAL_COMMUNITY)
Admission: RE | Admit: 2019-01-22 | Discharge: 2019-01-22 | Disposition: A | Discharge status: Home / Self Care | Payer: Medicaid Other | Source: Ambulatory Visit | Attending: Obstetrics and Gynecology | Admitting: Obstetrics and Gynecology

## 2019-01-22 ENCOUNTER — Encounter (HOSPITAL_COMMUNITY): Payer: Self-pay | Admitting: *Deleted

## 2019-01-22 ENCOUNTER — Other Ambulatory Visit: Payer: Self-pay

## 2019-01-22 DIAGNOSIS — Z3480 Encounter for supervision of other normal pregnancy, unspecified trimester: Secondary | ICD-10-CM | POA: Diagnosis not present

## 2019-01-22 DIAGNOSIS — O24415 Gestational diabetes mellitus in pregnancy, controlled by oral hypoglycemic drugs: Secondary | ICD-10-CM | POA: Insufficient documentation

## 2019-01-22 DIAGNOSIS — Z3A33 33 weeks gestation of pregnancy: Secondary | ICD-10-CM | POA: Diagnosis not present

## 2019-01-22 DIAGNOSIS — O09899 Supervision of other high risk pregnancies, unspecified trimester: Secondary | ICD-10-CM | POA: Diagnosis not present

## 2019-01-22 DIAGNOSIS — O09893 Supervision of other high risk pregnancies, third trimester: Secondary | ICD-10-CM | POA: Diagnosis not present

## 2019-01-28 ENCOUNTER — Inpatient Hospital Stay (HOSPITAL_COMMUNITY)
Admission: AD | Admit: 2019-01-28 | Discharge: 2019-01-28 | Disposition: A | Discharge status: Home / Self Care | Payer: Medicaid Other | Source: Ambulatory Visit | Attending: Family Medicine | Admitting: Family Medicine

## 2019-01-28 ENCOUNTER — Encounter (HOSPITAL_COMMUNITY): Payer: Self-pay | Admitting: Family Medicine

## 2019-01-28 ENCOUNTER — Telehealth: Payer: Self-pay | Admitting: General Practice

## 2019-01-28 ENCOUNTER — Other Ambulatory Visit: Payer: Self-pay

## 2019-01-28 ENCOUNTER — Encounter: Payer: Medicaid Other | Admitting: Advanced Practice Midwife

## 2019-01-28 DIAGNOSIS — Z0371 Encounter for suspected problem with amniotic cavity and membrane ruled out: Secondary | ICD-10-CM

## 2019-01-28 DIAGNOSIS — N898 Other specified noninflammatory disorders of vagina: Secondary | ICD-10-CM | POA: Diagnosis present

## 2019-01-28 DIAGNOSIS — Z825 Family history of asthma and other chronic lower respiratory diseases: Secondary | ICD-10-CM | POA: Insufficient documentation

## 2019-01-28 DIAGNOSIS — O4703 False labor before 37 completed weeks of gestation, third trimester: Secondary | ICD-10-CM

## 2019-01-28 DIAGNOSIS — Z3A34 34 weeks gestation of pregnancy: Secondary | ICD-10-CM

## 2019-01-28 DIAGNOSIS — Z87891 Personal history of nicotine dependence: Secondary | ICD-10-CM | POA: Insufficient documentation

## 2019-01-28 DIAGNOSIS — Z91030 Bee allergy status: Secondary | ICD-10-CM | POA: Insufficient documentation

## 2019-01-28 LAB — URINALYSIS, ROUTINE W REFLEX MICROSCOPIC
Bilirubin Urine: NEGATIVE
Glucose, UA: NEGATIVE mg/dL
Hgb urine dipstick: NEGATIVE
Ketones, ur: NEGATIVE mg/dL
Nitrite: NEGATIVE
Protein, ur: NEGATIVE mg/dL
Specific Gravity, Urine: 1.013 (ref 1.005–1.030)
pH: 6 (ref 5.0–8.0)

## 2019-01-28 LAB — WET PREP, GENITAL
Clue Cells Wet Prep HPF POC: NONE SEEN
Sperm: NONE SEEN
Trich, Wet Prep: NONE SEEN
Yeast Wet Prep HPF POC: NONE SEEN

## 2019-01-28 LAB — POCT FERN TEST: POCT Fern Test: NEGATIVE

## 2019-01-28 LAB — CULTURE, OB URINE: Culture: NO GROWTH

## 2019-01-28 MED ORDER — CYCLOBENZAPRINE HCL 10 MG PO TABS
10.0000 mg | ORAL_TABLET | Freq: Once | ORAL | Status: AC
  Administered 2019-01-28: 03:00:00 10 mg via ORAL
  Filled 2019-01-28: qty 1

## 2019-01-28 MED ORDER — NIFEDIPINE 10 MG PO CAPS
10.0000 mg | ORAL_CAPSULE | Freq: Once | ORAL | Status: DC
  Filled 2019-01-28: qty 1

## 2019-01-28 MED ORDER — NIFEDIPINE 10 MG PO CAPS
10.0000 mg | ORAL_CAPSULE | ORAL | Status: DC | PRN
  Administered 2019-01-28 (×2): 10 mg via ORAL
  Filled 2019-01-28 (×2): qty 1

## 2019-01-28 MED ORDER — TERBUTALINE SULFATE 1 MG/ML IJ SOLN
0.2500 mg | Freq: Once | INTRAMUSCULAR | Status: AC
  Administered 2019-01-28: 0.25 mg via SUBCUTANEOUS
  Filled 2019-01-28: qty 1

## 2019-01-28 NOTE — Telephone Encounter (Signed)
Left message in regards to appt that is scheduled for today at 1:10pm for MAU follow up.  Asked pt to give our office a call with any questions or concerns.

## 2019-01-28 NOTE — MAU Provider Note (Signed)
History     CSN: [id number]  Arrival date and time: 01/28/19 2703   First Provider Initiated Contact with Patient 01/28/19 0120      Chief Complaint  Patient presents with  . Vaginal Discharge   [name] is a 23 y.o. G3P2002 at [redacted]w[redacted]d  who presents to MAU today complaining "feeling wet" since last night around 2330. She reports having some mucous discharge that is yellow in color, denies odor or irritation.  She denies vaginal bleeding. She endorses contractions which she states is braxton hicks contractions that are occasionally painful- reports contractions occur every 5-10 minutes. She reports increased pressure in her vagina and pelvic pain. She reports normal fetal movement. She denies recent IC.    OB History    Gravida  3   Para  2   Term  2   Preterm  0   AB  0   Living  2     SAB  0   TAB  0   Ectopic  0   Multiple  0   Live Births  2           Past Medical History:  Diagnosis Date  . Asthma   . HSV (herpes simplex virus) infection     Past Surgical History:  Procedure Laterality Date  . ARM WOUND REPAIR / CLOSURE      Family History  Problem Relation Age of Onset  . Asthma Mother   . Diabetes Maternal Aunt   . Cancer Maternal Grandmother     Social History   Tobacco Use  . Smoking status: Former Smoker    Types: Cigarettes, Cigars  . Smokeless tobacco: Never Used  Substance Use Topics  . Alcohol use: Not Currently  . 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 Bee Venom Anaphylaxis    No medications prior to admission.    Review of Systems  Constitutional: Negative.   Respiratory: Negative.   Cardiovascular: Negative.   Gastrointestinal: Positive for abdominal pain. Negative for constipation, diarrhea, nausea and vomiting.  Genitourinary: Positive for pelvic pain and vaginal discharge. Negative for difficulty urinating, dysuria, frequency,  urgency and vaginal bleeding.  Musculoskeletal: Negative.   Neurological: Negative.    Physical Exam   Blood pressure 109/64, pulse (!) 109, temperature 98.9 F (37.2 C), resp. rate 19, weight 75.9 kg, last menstrual period 05/26/2018, SpO2 100 %, currently breastfeeding.  Physical Exam  Vitals reviewed. Constitutional: She is oriented to person, place, and time. She appears well-developed and well-nourished. No distress.  Cardiovascular: Normal rate and regular rhythm.  Respiratory: Effort normal. No respiratory distress. She has no wheezes.  GI: Soft. There is no abdominal tenderness. There is no rebound and no guarding.  Gravid appropriate for gestational age  Genitourinary:    Vaginal discharge present.     No vaginal bleeding.  No bleeding in the vagina.    Genitourinary Comments: PELVIC: Normal external female genitalia. Vagina is pink and rugated. Cervix with normal contour, no lesions. Small amount of white mucous discharge.  Negative pooling.    Musculoskeletal:        General: No edema. Normal range of motion.  Neurological: She is alert and oriented to person, place, and time.  Psychiatric: She has a normal mood and affect. Her behavior is normal. Thought content normal.   Initial cervical examination @ 0137 Cervical exam:  Dilation: Fingertip  Effacement (%): Thick Cervical Position: Posterior Presentation: Vertex Exam by:: V Hayward Rylander CNM  FHR: 145/moderate/+accels/ no decelerations  Toco: irregular mild UC   MAU Course  Procedures  MDM Sterile speculum examination - Fern and wet prep collected  Fern negative  Wet prep negative   Patient rates pelvic pain and contractions 5/10- procardia ordered, unable to give procardia d/t low BP - flexeril ordered at 0238  - Plan to recheck patient   Reassessment at 0302: patient reports abdominal and pelvic pain resolved and is now having vaginal pain "inside". She reports it feels like last pregnancy where she needed to  get a shot to stop contracting.  Dilation: 1.5 Effacement (%): Thick Cervical Position: Posterior Presentation: Vertex Exam by:: Lanice Shirts, CNM  Cervix changed from 0.5cm to 1.5cm - Terbutaline ordered - unable to give procardia d/t continued low BP. Extended monitoring to assess for continued cervical change.   Reassessment at 0345: patient reports continued vaginal pain, monitor shows occasional contractions/ mild by palpation - BP up to 118/64 - procardia ordered   Reassessment at 0600: 2 doses of procardia given, unable to give 3rd d/t BP. Cervix evaluated and no cervical change. Patient reports pain has subsided.   Discussed reasons to return to MAU. Preterm labor precautions discussed. Message sent to office to have patient scheduled for follow up/cervical examination this week. Return to MAU as needed. Pt stable at time of discharge.   Assessment and Plan   1. Preterm uterine contractions in third trimester, antepartum   2. [redacted] weeks gestation of pregnancy   3. No leakage of amniotic fluid into vagina    Discharge home Follow up as scheduled in the office this week for cervical examination  Return to MAU as needed for reasons discussed and/or emergencies  Preterm labor precautions  Hydration and rest   Follow-up Information    [facility]. Schedule an appointment as soon as possible for a visit on 01/29/2019.   Specialty: Obstetrics and Gynecology Why: Make appointment to be seen for follow up care this week  Contact information: [address] [place] [facility] [place] [address] [phone]         Allergies as of 01/28/2019      Reactions   Bee Venom Anaphylaxis      Medication List    STOP taking these medications   amoxicillin-clavulanate 875-125 MG tablet Commonly known as: Augmentin     TAKE these medications   Accu-Chek FastClix Lancets Misc 1 Device by Percutaneous route 4 (four) times daily.   Accu-Chek Guide test  strip Generic drug: glucose blood Use as instructed QID   acetaminophen 325 MG tablet Commonly known as: Tylenol Take 2 tablets (650 mg total) by mouth every 6 (six) hours as needed.   metFORMIN 500 MG 24 hr tablet Commonly known as: Glucophage XR Take 1 tablet (500 mg total) by mouth at bedtime.   Vitafol Gummies 3.33-0.333-34.8 MG Chew Chew 3 each by mouth daily.       Sharyon Cable CNM 01/28/2019, 7:15 AM

## 2019-01-28 NOTE — MAU Note (Signed)
States she's having braxton hicks and vaginal pressure.  States she felt "wet" down there and had some mucousy discharge.  Still feels wet but doesn't recall a large gush of fluid.  No VB.  Endorses + FM

## 2019-01-29 ENCOUNTER — Ambulatory Visit (HOSPITAL_COMMUNITY): Payer: Medicaid Other

## 2019-01-29 ENCOUNTER — Telehealth (INDEPENDENT_AMBULATORY_CARE_PROVIDER_SITE_OTHER): Payer: Medicaid Other | Admitting: Student

## 2019-01-29 VITALS — Wt 167.7 lb

## 2019-01-29 DIAGNOSIS — Z3A34 34 weeks gestation of pregnancy: Secondary | ICD-10-CM

## 2019-01-29 DIAGNOSIS — O24415 Gestational diabetes mellitus in pregnancy, controlled by oral hypoglycemic drugs: Secondary | ICD-10-CM

## 2019-01-29 DIAGNOSIS — Z3480 Encounter for supervision of other normal pregnancy, unspecified trimester: Secondary | ICD-10-CM

## 2019-01-29 MED ORDER — GLYBURIDE 2.5 MG PO TABS: 2.5000 mg | ORAL_TABLET | Freq: Every day | ORAL | 0 refills | Status: DC

## 2019-01-29 NOTE — Progress Notes (Signed)
I connected with@ on 01/29/19 at  9:50 AM EST by: mychart video and verified that I am speaking with the correct person using two identifiers.  Patient is located at home and provider is located at [facility].     The purpose of this virtual visit is to provide medical care while limiting exposure to the novel coronavirus. I discussed the limitations, risks, security and privacy concerns of performing an evaluation and management service by mychart video and the availability of in person appointments. I also discussed with the patient that there may be a patient responsible charge related to this service. By engaging in this virtual visit, you consent to the provision of healthcare.  Additionally, you authorize for your insurance to be billed for the services provided during this visit.  The patient expressed understanding and agreed to proceed.  The following staff members participated in the virtual visit:  Judeth Horn NP    PRENATAL VISIT NOTE  Subjective:  Jaclyn Owens is a 23 y.o. G3P2002 at [redacted]w[redacted]d  for phone visit for ongoing prenatal care.  She is currently monitored for the following issues for this high-risk pregnancy and has Adjustment disorder with depressed mood; Supervision of other normal pregnancy, antepartum; HSV-2 infection complicating pregnancy; Short interval between pregnancies affecting pregnancy, antepartum; and Gestational diabetes on their problem list.  Patient reports no complaints.  Contractions: Irritability. Vag. Bleeding: None.  Movement: Present. Denies leaking of fluid.   Has not been taking her metformin regularly due to GI side effect. States it makes her nauseated & causes diarrhea. Thinks she may be taking it every other night but unsure. Also hasn't been checking blood sugars regularly; states it's too difficult due to her schedule & she gets tired of checking so frequently.    Fasting PPB PPL PPD  88-121, 6/11 abnl 81-151, 1/9 abnl 80-198, 1/6 abnl  89-89, 0/1 abnl     The following portions of the patient's history were reviewed and updated as appropriate: allergies, current medications, past family history, past medical history, past social history, past surgical history and problem list.   Objective:    Vitals:   01/29/19 1008  Weight: 167 lb 11.2 oz (76.1 kg)   Self-Obtained  Fetal Status:     Movement: Present     Assessment and Plan:  Pregnancy: G3P2002 at [redacted]w[redacted]d 1. Supervision of other normal pregnancy, antepartum -pt didn't have access to BP cuff for today's visit.  -was seen in MAU earlier this week for preterm contractions. States no symptoms since then.  2. Gestational diabetes mellitus (GDM) in third trimester controlled on oral hypoglycemic drug -Not taking metformin as prescribed; per consult with Dr. Debroah Loop, will change to glyburide 2.5 mg daily.  -Will transfer to [facility] due to high risk pregnancy & GDM requiring antenatal testing.  -Encouraged patient to check blood sugars at least once per day.  - glyBURIDE (DIABETA) 2.5 MG tablet; Take 1 tablet (2.5 mg total) by mouth daily with breakfast.  Dispense: 30 tablet; Refill: 0  Preterm labor symptoms and general obstetric precautions including but not limited to vaginal bleeding, contractions, leaking of fluid and fetal movement were reviewed in detail with the patient.  No follow-ups on file.  Future Appointments  Date Time Provider Department Center  02/05/2019  3:00 PM [facility]-M[facility] [place] 3 WH-MFCUS MFC-US  02/05/2019  3:10 PM WH-MFC NURSE WH-MFC MFC-US  02/12/2019 11:15 AM Adam Phenix, MD [facility]     Time spent on virtual visit: 8 minutes  Denny Peon  Purcell Nails, NP

## 2019-01-30 NOTE — L&D Delivery Note (Signed)
Faculty Note  Called to room for prolonged decel to 60s, shortly after epidural. Pt found to be 10/100+2, AROM with exam. Instructed to push and she did not push effectively. Began to consent for vacuum, however, patient did then push effectively and delivered viable infant from LOA position with dark gush of blood. Infant with good tone, cord clamped and cut and infant taken to warmer for waiting pedi staff. Placenta delivered shortly thereafter, suspect possible abruption.    Cord blood obtained. Minimal perineal abrasion noted, not repaired. Weight pending, Apgars pending. EBL: 200 mL. Dr. Salomon Mast also present for delivery.   Jaclyn Owens, M.D. Attending [facility])

## 2019-02-05 ENCOUNTER — Other Ambulatory Visit: Payer: Self-pay

## 2019-02-05 ENCOUNTER — Ambulatory Visit (HOSPITAL_COMMUNITY): Payer: Medicaid Other | Admitting: *Deleted

## 2019-02-05 ENCOUNTER — Encounter (HOSPITAL_COMMUNITY): Payer: Self-pay

## 2019-02-05 ENCOUNTER — Ambulatory Visit (HOSPITAL_COMMUNITY)
Admission: RE | Admit: 2019-02-05 | Discharge: 2019-02-05 | Disposition: A | Discharge status: Home / Self Care | Payer: Medicaid Other | Source: Ambulatory Visit | Attending: Obstetrics and Gynecology | Admitting: Obstetrics and Gynecology

## 2019-02-05 DIAGNOSIS — O09893 Supervision of other high risk pregnancies, third trimester: Secondary | ICD-10-CM | POA: Diagnosis not present

## 2019-02-05 DIAGNOSIS — Z362 Encounter for other antenatal screening follow-up: Secondary | ICD-10-CM | POA: Diagnosis not present

## 2019-02-05 DIAGNOSIS — Z3A35 35 weeks gestation of pregnancy: Secondary | ICD-10-CM

## 2019-02-05 DIAGNOSIS — O09899 Supervision of other high risk pregnancies, unspecified trimester: Secondary | ICD-10-CM | POA: Diagnosis not present

## 2019-02-05 DIAGNOSIS — Z3480 Encounter for supervision of other normal pregnancy, unspecified trimester: Secondary | ICD-10-CM

## 2019-02-05 DIAGNOSIS — O24415 Gestational diabetes mellitus in pregnancy, controlled by oral hypoglycemic drugs: Secondary | ICD-10-CM | POA: Insufficient documentation

## 2019-02-06 ENCOUNTER — Other Ambulatory Visit (HOSPITAL_COMMUNITY): Payer: Self-pay | Admitting: *Deleted

## 2019-02-06 ENCOUNTER — Encounter: Payer: Self-pay | Admitting: Family Medicine

## 2019-02-06 DIAGNOSIS — O24415 Gestational diabetes mellitus in pregnancy, controlled by oral hypoglycemic drugs: Secondary | ICD-10-CM

## 2019-02-12 ENCOUNTER — Ambulatory Visit (HOSPITAL_COMMUNITY): Admission: RE | Admit: 2019-02-12 | Payer: Medicaid Other | Source: Ambulatory Visit

## 2019-02-12 ENCOUNTER — Ambulatory Visit (HOSPITAL_COMMUNITY): Payer: Medicaid Other

## 2019-02-12 ENCOUNTER — Ambulatory Visit (HOSPITAL_COMMUNITY): Payer: Medicaid Other | Admitting: *Deleted

## 2019-02-12 ENCOUNTER — Encounter: Payer: Medicaid Other | Admitting: Obstetrics & Gynecology

## 2019-02-12 ENCOUNTER — Other Ambulatory Visit: Payer: Self-pay

## 2019-02-12 ENCOUNTER — Ambulatory Visit (HOSPITAL_COMMUNITY)
Admission: RE | Admit: 2019-02-12 | Discharge: 2019-02-12 | Disposition: A | Discharge status: Home / Self Care | Payer: Medicaid Other | Source: Ambulatory Visit | Attending: Obstetrics and Gynecology | Admitting: Obstetrics and Gynecology

## 2019-02-12 ENCOUNTER — Encounter: Payer: Medicaid Other | Admitting: Obstetrics and Gynecology

## 2019-02-12 ENCOUNTER — Encounter (HOSPITAL_COMMUNITY): Payer: Self-pay

## 2019-02-12 DIAGNOSIS — Z3480 Encounter for supervision of other normal pregnancy, unspecified trimester: Secondary | ICD-10-CM

## 2019-02-12 DIAGNOSIS — O24415 Gestational diabetes mellitus in pregnancy, controlled by oral hypoglycemic drugs: Secondary | ICD-10-CM | POA: Diagnosis not present

## 2019-02-12 DIAGNOSIS — O09899 Supervision of other high risk pregnancies, unspecified trimester: Secondary | ICD-10-CM | POA: Diagnosis not present

## 2019-02-12 DIAGNOSIS — O09893 Supervision of other high risk pregnancies, third trimester: Secondary | ICD-10-CM | POA: Diagnosis not present

## 2019-02-12 DIAGNOSIS — Z3A36 36 weeks gestation of pregnancy: Secondary | ICD-10-CM

## 2019-02-13 ENCOUNTER — Telehealth: Payer: Self-pay | Admitting: *Deleted

## 2019-02-13 NOTE — Telephone Encounter (Signed)
----   Message ----- From: Judeth Horn, NP Sent: 02/02/2019   7:58 PM EST To: Gerome Apley, RN Subject: RE: diabetes medication                        Dr. Shawnie Pons had discussed with you that the glyburide would cause lower blood sugar in your baby once baby is born (in the first few days), but it is one of the medications we use in pregnancy. The other option is insulin. Uncontrolled diabetes during pregnancy has more risks for your baby than the medication. If you are uncomfortable taking the glyburide, you can discuss more of your options with Dr. Debroah Loop during your next visit. I would encourage you to continue with the metformin if you aren't going to take the glyburide.  ----- Message -----  Upon chart review could not verify [name] had received this message.   I called Jaclyn Owens and confirmed she  Had not received this message from [name], NP. I  reviewed  The reccomendations. [name] confirms she is not going to take the glyburide or insulin; but will take the metformin sometimes. I advised her to discuss with provider at her next visit and I reviewed the appointment date/ time / location of 1/ 19/21 at 4:15pm. She voices understanding.  [name],RN

## 2019-02-15 ENCOUNTER — Inpatient Hospital Stay (EMERGENCY_DEPARTMENT_HOSPITAL)
Admission: AD | Admit: 2019-02-15 | Discharge: 2019-02-15 | Disposition: A | Discharge status: Home / Self Care | Payer: Medicaid Other | Source: Home / Self Care | Attending: Obstetrics & Gynecology | Admitting: Obstetrics & Gynecology

## 2019-02-15 ENCOUNTER — Encounter (HOSPITAL_COMMUNITY): Payer: Self-pay | Admitting: Obstetrics & Gynecology

## 2019-02-15 DIAGNOSIS — O479 False labor, unspecified: Secondary | ICD-10-CM | POA: Diagnosis not present

## 2019-02-15 DIAGNOSIS — Z0371 Encounter for suspected problem with amniotic cavity and membrane ruled out: Secondary | ICD-10-CM | POA: Insufficient documentation

## 2019-02-15 DIAGNOSIS — O4703 False labor before 37 completed weeks of gestation, third trimester: Secondary | ICD-10-CM | POA: Insufficient documentation

## 2019-02-15 DIAGNOSIS — Z3A36 36 weeks gestation of pregnancy: Secondary | ICD-10-CM

## 2019-02-15 LAB — GLUCOSE, CAPILLARY: Glucose-Capillary: 76 mg/dL (ref 70–99)

## 2019-02-15 LAB — POCT FERN TEST: POCT Fern Test: NEGATIVE

## 2019-02-15 NOTE — MAU Note (Addendum)
Jaclyn Owens is a 24 y.o. at [redacted]w[redacted]d here in MAU reporting:  +vaginal discharge. white. Thick/mucous like in nature she reports. Inquiring to RN if her "water" had broken. States she is having to change her underwear; patient has pictures on her phone.  Arrived via EMS. Talking on cell phone. Ambulatory to stretcher.  Onset of complaint: 3 days Pain score: denies at this time. Contractions earlier in the day.  Denies recent intercourse. Vitals:   02/15/19 1650  BP: 112/68  Pulse: (!) 107  Resp: 19  Temp: 97.7 F (36.5 C)  SpO2: 100%    FHT: 155 via efm  Lab orders placed from triage: mau labor triage

## 2019-02-15 NOTE — MAU Provider Note (Signed)
S: Jaclyn Owens is a 24 y.o. G3P2002 at [redacted]w[redacted]d  who presents to MAU today complaining of leaking of fluid for 3 days. She denies vaginal bleeding. She endorses contractions. She reports normal fetal movement.    O: BP 109/63 (BP Location: Right Arm)   Pulse 88   Temp 97.7 F (36.5 C) (Oral)   Resp 19   LMP 05/26/2018 (Exact Date)   SpO2 100%  GENERAL: Well-developed, well-nourished female in no acute distress.  HEAD: Normocephalic, atraumatic.  CHEST: Normal effort of breathing, regular heart rate ABDOMEN: Soft, nontender, gravid PELVIC: Normal external female genitalia. Vagina is pink and rugated. Cervix with normal contour, no lesions. Normal discharge.  NO pooling.   Cervical exam:  Dilation: 1.5 Effacement (%): Thick Cervical Position: Posterior Station: -3 Exam by:: Cleone Slim CNM   Fetal Monitoring: Baseline: 145 Variability: moderate Accelerations: 15x15 Decelerations: none Contractions: ui  Results for orders placed or performed during the hospital encounter of 02/15/19 (from the past 24 hour(s))  POCT fern test     Status: None   Collection Time: 02/15/19  5:04 PM  Result Value Ref Range   POCT Fern Test Negative = intact amniotic membranes   Glucose, capillary     Status: None   Collection Time: 02/15/19  5:08 PM  Result Value Ref Range   Glucose-Capillary 76 70 - 99 mg/dL   Comment 1 Notify RN    Comment 2 Document in Chart    A: [facility] at [redacted]w[redacted]d  Membranes intact  P: -Discharge home in stable condition -Stressed importance of keeping scheduled follow up appointments this week to determine delivery plan. Reiterated importance of monitoring CBGs and taking glyburide as prescribed. -Labor precautions discussed -Patient advised to follow-up with [facility] on Tuesday as scheduled  -Patient may return to MAU as needed or if her condition were to change or worsen   Rolm Bookbinder, [place] 02/15/2019 6:06 PM

## 2019-02-15 NOTE — Discharge Instructions (Signed)
Braxton Hicks Contractions °Contractions of the uterus can occur throughout pregnancy, but they are not always a sign that you are in labor. You may have practice contractions called Braxton Hicks contractions. These false labor contractions are sometimes confused with true labor. °What are Braxton Hicks contractions? °Braxton Hicks contractions are tightening movements that occur in the muscles of the uterus before labor. Unlike true labor contractions, these contractions do not result in opening (dilation) and thinning of the cervix. Toward the end of pregnancy (32-34 weeks), Braxton Hicks contractions can happen more often and may become stronger. These contractions are sometimes difficult to tell apart from true labor because they can be very uncomfortable. You should not feel embarrassed if you go to the hospital with false labor. °Sometimes, the only way to tell if you are in true labor is for your health care provider to look for changes in the cervix. The health care provider will do a physical exam and may monitor your contractions. If you are not in true labor, the exam should show that your cervix is not dilating and your water has not broken. °If there are no other health problems associated with your pregnancy, it is completely safe for you to be sent home with false labor. You may continue to have Braxton Hicks contractions until you go into true labor. °How to tell the difference between true labor and false labor °True labor °· Contractions last 30-70 seconds. °· Contractions become very regular. °· Discomfort is usually felt in the top of the uterus, and it spreads to the lower abdomen and low back. °· Contractions do not go away with walking. °· Contractions usually become more intense and increase in frequency. °· The cervix dilates and gets thinner. °False labor °· Contractions are usually shorter and not as strong as true labor contractions. °· Contractions are usually irregular. °· Contractions  are often felt in the front of the lower abdomen and in the groin. °· Contractions may go away when you walk around or change positions while lying down. °· Contractions get weaker and are shorter-lasting as time goes on. °· The cervix usually does not dilate or become thin. °Follow these instructions at home: ° °· Take over-the-counter and prescription medicines only as told by your health care provider. °· Keep up with your usual exercises and follow other instructions from your health care provider. °· Eat and drink lightly if you think you are going into labor. °· If Braxton Hicks contractions are making you uncomfortable: °? Change your position from lying down or resting to walking, or change from walking to resting. °? Sit and rest in a tub of warm water. °? Drink enough fluid to keep your urine pale yellow. Dehydration may cause these contractions. °? Do slow and deep breathing several times an hour. °· Keep all follow-up prenatal visits as told by your health care provider. This is important. °Contact a health care provider if: °· You have a fever. °· You have continuous pain in your abdomen. °Get help right away if: °· Your contractions become stronger, more regular, and closer together. °· You have fluid leaking or gushing from your vagina. °· You pass blood-tinged mucus (bloody show). °· You have bleeding from your vagina. °· You have low back pain that you never had before. °· You feel your baby’s head pushing down and causing pelvic pressure. °· Your baby is not moving inside you as much as it used to. °Summary °· Contractions that occur before labor are   called Braxton Hicks contractions, false labor, or practice contractions. °· Braxton Hicks contractions are usually shorter, weaker, farther apart, and less regular than true labor contractions. True labor contractions usually become progressively stronger and regular, and they become more frequent. °· Manage discomfort from Braxton Hicks contractions  by changing position, resting in a warm bath, drinking plenty of water, or practicing deep breathing. °This information is not intended to replace advice given to you by your health care provider. Make sure you discuss any questions you have with your health care provider. °Document Revised: 12/28/2016 Document Reviewed: 05/31/2016 °Elsevier Patient Education © 2020 [employer] ° °

## 2019-02-16 ENCOUNTER — Inpatient Hospital Stay (HOSPITAL_COMMUNITY): Payer: Medicaid Other | Admitting: Anesthesiology

## 2019-02-16 ENCOUNTER — Inpatient Hospital Stay (HOSPITAL_COMMUNITY)
Admission: AD | Admit: 2019-02-16 | Discharge: 2019-02-18 | DRG: 805 | Disposition: A | Discharge status: Home / Self Care | Payer: Medicaid Other | Attending: Obstetrics and Gynecology | Admitting: Obstetrics and Gynecology

## 2019-02-16 DIAGNOSIS — O24425 Gestational diabetes mellitus in childbirth, controlled by oral hypoglycemic drugs: Principal | ICD-10-CM | POA: Diagnosis present

## 2019-02-16 DIAGNOSIS — O9089 Other complications of the puerperium, not elsewhere classified: Secondary | ICD-10-CM | POA: Diagnosis not present

## 2019-02-16 DIAGNOSIS — O09899 Supervision of other high risk pregnancies, unspecified trimester: Secondary | ICD-10-CM

## 2019-02-16 DIAGNOSIS — Z3A37 37 weeks gestation of pregnancy: Secondary | ICD-10-CM | POA: Diagnosis not present

## 2019-02-16 DIAGNOSIS — O9952 Diseases of the respiratory system complicating childbirth: Secondary | ICD-10-CM | POA: Diagnosis not present

## 2019-02-16 DIAGNOSIS — Z20822 Contact with and (suspected) exposure to covid-19: Secondary | ICD-10-CM | POA: Diagnosis not present

## 2019-02-16 DIAGNOSIS — R0600 Dyspnea, unspecified: Secondary | ICD-10-CM | POA: Diagnosis not present

## 2019-02-16 DIAGNOSIS — Z87891 Personal history of nicotine dependence: Secondary | ICD-10-CM

## 2019-02-16 DIAGNOSIS — O4593 Premature separation of placenta, unspecified, third trimester: Secondary | ICD-10-CM | POA: Diagnosis present

## 2019-02-16 DIAGNOSIS — A6000 Herpesviral infection of urogenital system, unspecified: Secondary | ICD-10-CM | POA: Diagnosis present

## 2019-02-16 DIAGNOSIS — O9832 Other infections with a predominantly sexual mode of transmission complicating childbirth: Secondary | ICD-10-CM | POA: Diagnosis present

## 2019-02-16 DIAGNOSIS — Z3A00 Weeks of gestation of pregnancy not specified: Secondary | ICD-10-CM | POA: Diagnosis not present

## 2019-02-16 DIAGNOSIS — I959 Hypotension, unspecified: Secondary | ICD-10-CM | POA: Diagnosis not present

## 2019-02-16 DIAGNOSIS — J45909 Unspecified asthma, uncomplicated: Secondary | ICD-10-CM | POA: Diagnosis not present

## 2019-02-16 DIAGNOSIS — O479 False labor, unspecified: Secondary | ICD-10-CM | POA: Diagnosis not present

## 2019-02-16 DIAGNOSIS — Z30017 Encounter for initial prescription of implantable subdermal contraceptive: Secondary | ICD-10-CM | POA: Diagnosis not present

## 2019-02-16 DIAGNOSIS — Z3480 Encounter for supervision of other normal pregnancy, unspecified trimester: Secondary | ICD-10-CM

## 2019-02-16 DIAGNOSIS — I10 Essential (primary) hypertension: Secondary | ICD-10-CM | POA: Diagnosis not present

## 2019-02-16 DIAGNOSIS — O26893 Other specified pregnancy related conditions, third trimester: Secondary | ICD-10-CM | POA: Diagnosis present

## 2019-02-16 DIAGNOSIS — R079 Chest pain, unspecified: Secondary | ICD-10-CM | POA: Diagnosis not present

## 2019-02-16 DIAGNOSIS — O24415 Gestational diabetes mellitus in pregnancy, controlled by oral hypoglycemic drugs: Secondary | ICD-10-CM

## 2019-02-16 DIAGNOSIS — R52 Pain, unspecified: Secondary | ICD-10-CM | POA: Diagnosis not present

## 2019-02-16 DIAGNOSIS — Z30430 Encounter for insertion of intrauterine contraceptive device: Secondary | ICD-10-CM | POA: Diagnosis not present

## 2019-02-16 LAB — RESPIRATORY PANEL BY RT PCR (FLU A&B, COVID)
Influenza A by PCR: NEGATIVE
Influenza B by PCR: NEGATIVE
SARS Coronavirus 2 by RT PCR: NEGATIVE

## 2019-02-16 LAB — CBC
HCT: 36.9 % (ref 36.0–46.0)
Hemoglobin: 12.8 g/dL (ref 12.0–15.0)
MCH: 30 pg (ref 26.0–34.0)
MCHC: 34.7 g/dL (ref 30.0–36.0)
MCV: 86.6 fL (ref 80.0–100.0)
Platelets: 244 10*3/uL (ref 150–400)
RBC: 4.26 MIL/uL (ref 3.87–5.11)
RDW: 13.1 % (ref 11.5–15.5)
WBC: 9.7 10*3/uL (ref 4.0–10.5)
nRBC: 0 % (ref 0.0–0.2)

## 2019-02-16 LAB — TYPE AND SCREEN
ABO/RH(D): B POS
Antibody Screen: NEGATIVE

## 2019-02-16 LAB — GROUP B STREP BY PCR: Group B strep by PCR: NEGATIVE

## 2019-02-16 LAB — ABO/RH: ABO/RH(D): B POS

## 2019-02-16 MED ORDER — SODIUM CHLORIDE 0.9 % IV SOLN
2.0000 g | Freq: Once | INTRAVENOUS | Status: AC
  Administered 2019-02-16: 2 g via INTRAVENOUS
  Filled 2019-02-16: qty 2000

## 2019-02-16 MED ORDER — FENTANYL-BUPIVACAINE-NACL 0.5-0.125-0.9 MG/250ML-% EP SOLN
EPIDURAL | Status: AC
  Filled 2019-02-16: qty 250

## 2019-02-16 MED ORDER — LACTATED RINGERS IV SOLN
500.0000 mL | INTRAVENOUS | Status: DC | PRN
  Administered 2019-02-16: 500 mL via INTRAVENOUS

## 2019-02-16 MED ORDER — OXYCODONE-ACETAMINOPHEN 5-325 MG PO TABS: 2.0000 | ORAL_TABLET | ORAL | Status: DC | PRN

## 2019-02-16 MED ORDER — OXYTOCIN 10 UNIT/ML IJ SOLN: 10.0000 [IU] | Freq: Once | INTRAMUSCULAR | Status: DC | PRN

## 2019-02-16 MED ORDER — PHENYLEPHRINE 40 MCG/ML (10ML) SYRINGE FOR IV PUSH (FOR BLOOD PRESSURE SUPPORT)
PREFILLED_SYRINGE | INTRAVENOUS | Status: AC
  Filled 2019-02-16: qty 10

## 2019-02-16 MED ORDER — OXYTOCIN 40 UNITS IN NORMAL SALINE INFUSION - SIMPLE MED
2.5000 [IU]/h | INTRAVENOUS | Status: DC
  Filled 2019-02-16: qty 1000

## 2019-02-16 MED ORDER — OXYCODONE-ACETAMINOPHEN 5-325 MG PO TABS: 1.0000 | ORAL_TABLET | ORAL | Status: DC | PRN

## 2019-02-16 MED ORDER — SODIUM CHLORIDE (PF) 0.9 % IJ SOLN
INTRAMUSCULAR | Status: DC | PRN
  Administered 2019-02-16: 12 mL/h via EPIDURAL

## 2019-02-16 MED ORDER — LACTATED RINGERS IV SOLN: INTRAVENOUS | Status: DC

## 2019-02-16 MED ORDER — OXYTOCIN BOLUS FROM INFUSION
500.0000 mL | Freq: Once | INTRAVENOUS | Status: AC
  Administered 2019-02-16: 500 mL via INTRAVENOUS

## 2019-02-16 MED ORDER — LIDOCAINE HCL (PF) 1 % IJ SOLN
INTRAMUSCULAR | Status: DC | PRN
  Administered 2019-02-16: 2 mL via EPIDURAL
  Administered 2019-02-16: 10 mL via EPIDURAL

## 2019-02-16 MED ORDER — FENTANYL CITRATE (PF) 100 MCG/2ML IJ SOLN
100.0000 ug | INTRAMUSCULAR | Status: DC | PRN
  Filled 2019-02-16: qty 2

## 2019-02-16 MED ORDER — ACETAMINOPHEN 325 MG PO TABS: 650.0000 mg | ORAL_TABLET | ORAL | Status: DC | PRN

## 2019-02-16 MED ORDER — EPHEDRINE 5 MG/ML INJ
INTRAVENOUS | Status: AC
  Filled 2019-02-16: qty 10

## 2019-02-16 MED ORDER — ONDANSETRON HCL 4 MG/2ML IJ SOLN: 4.0000 mg | Freq: Four times a day (QID) | INTRAMUSCULAR | Status: DC | PRN

## 2019-02-16 MED ORDER — LIDOCAINE HCL (PF) 1 % IJ SOLN: 30.0000 mL | INTRAMUSCULAR | Status: DC | PRN

## 2019-02-16 MED ORDER — SOD CITRATE-CITRIC ACID 500-334 MG/5ML PO SOLN: 30.0000 mL | ORAL | Status: DC | PRN

## 2019-02-16 MED ORDER — FENTANYL CITRATE (PF) 100 MCG/2ML IJ SOLN
INTRAMUSCULAR | Status: DC | PRN
  Administered 2019-02-16: 15 ug via INTRAVENOUS

## 2019-02-16 NOTE — Anesthesia Preprocedure Evaluation (Signed)
Anesthesia Evaluation  Patient identified by MRN, date of birth, ID band Patient awake    Reviewed: Allergy & Precautions, Patient's Chart, lab work & pertinent test results  Airway Mallampati: I  TM Distance: >3 FB Neck ROM: Full    Dental no notable dental hx.    Pulmonary asthma , former smoker,    Pulmonary exam normal breath sounds clear to auscultation       Cardiovascular negative cardio ROS Normal cardiovascular exam Rhythm:Regular Rate:Normal     Neuro/Psych PSYCHIATRIC DISORDERS Adjustment disorder, depressionnegative neurological ROS     GI/Hepatic negative GI ROS, Neg liver ROS,   Endo/Other  diabetes, Well Controlled, Gestational, Oral Hypoglycemic Agents  Renal/GU negative Renal ROS  negative genitourinary   Musculoskeletal negative musculoskeletal ROS (+)   Abdominal Normal abdominal exam  (+)   Peds  Hematology negative hematology ROS (+)   Anesthesia Other Findings   Reproductive/Obstetrics negative OB ROS                             Anesthesia Physical  Anesthesia Plan  ASA: II and emergent  Anesthesia Plan: Epidural   Post-op Pain Management:    Induction:   PONV Risk Score and Plan: Treatment may vary due to age or medical condition  Airway Management Planned: Natural Airway  Additional Equipment: None  Intra-op Plan:   Post-operative Plan:   Informed Consent: I have reviewed the patients History and Physical, chart, labs and discussed the procedure including the risks, benefits and alternatives for the proposed anesthesia with the patient or authorized representative who has indicated his/her understanding and acceptance.       Plan Discussed with: Anesthesiologist  Anesthesia Plan Comments:         Anesthesia Quick Evaluation

## 2019-02-16 NOTE — MAU Note (Signed)
Patient presents via EMS for contractions every 5 minutes.  Denies LOF.  Some bloody show.  Endorses + FM.

## 2019-02-16 NOTE — H&P (Signed)
OBSTETRIC ADMISSION HISTORY AND PHYSICAL  Jaclyn Owens is a 24 y.o. female G3P2002 with IUP at [redacted]w[redacted]d presenting for SOL. She reports +FMs. No LOF, VB, blurry vision, headaches, peripheral edema, or RUQ pain. She plans on breastfeeding. She requests Nexplanon for birth control.  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 Asthma   . HSV (herpes simplex virus) infection     Past Surgical History: Past Surgical History:  Procedure Laterality Date  . ARM WOUND REPAIR / CLOSURE      Obstetrical History: OB History    Gravida  3   Para  2   Term  2   Preterm  0   AB  0   Living  2     SAB  0   TAB  0   Ectopic  0   Multiple  0   Live Births  2           Social History: Social History   Socioeconomic History  . Marital status: Single    Spouse name: Not on file  . Number of children: 1  . Years of education: 55  . Highest education level: High school graduate  Occupational History  . Not on file  Tobacco Use  . Smoking status: Former Smoker    Types: Cigarettes, Cigars  . Smokeless tobacco: Never Used  Substance and Sexual Activity  . Alcohol use: Not Currently  . Drug use: No    Types: Marijuana    Comment: MJ use 01-24-15  . Sexual activity: Yes    Birth control/protection: None  Other Topics Concern  . Not on file  Social History Narrative  . Not on file   Social Determinants of Health   Financial Resource Strain:   . Difficulty of Paying Living Expenses: Not on file  Food Insecurity:   . Worried About Charity fundraiser in the Last Year: Not on file  . Ran Out of Food in the Last Year: Not on file  Transportation Needs:   . Lack  of Transportation (Medical): Not on file  . Lack of Transportation (Non-Medical): Not on file  Physical Activity:   . Days of Exercise per Week: Not on file  . Minutes of Exercise per Session: Not on file  Stress:   . Feeling of Stress : Not on file  Social Connections:   . Frequency of Communication with Friends and Family: Not on file  . Frequency of Social Gatherings with Friends and Family: Not on file  . Attends Religious Services: Not on file  . Active Member of Clubs or Organizations: Not on file  . Attends Archivist Meetings: Not on file  . Marital Status: Not on file    Family History: Family History  Problem Relation Age of Onset  . Asthma Mother   . Diabetes Maternal Aunt   . Cancer Maternal Grandmother     Allergies: Allergies  Allergen Reactions  . Bee Venom Anaphylaxis    Medications Prior to Admission  Medication Sig Dispense Refill Last Dose  . diphenhydrAMINE (SOMINEX) 25 MG tablet Take 25 mg by mouth at bedtime as needed for sleep.     . Accu-Chek FastClix Lancets MISC 1 Device  by Percutaneous route 4 (four) times daily. 100 each 12   . acetaminophen (TYLENOL) 325 MG tablet Take 2 tablets (650 mg total) by mouth every 6 (six) hours as needed.     Marland Kitchen glucose blood (ACCU-CHEK GUIDE) test strip Use as instructed QID 100 each 12   . glyBURIDE (DIABETA) 2.5 MG tablet Take 1 tablet (2.5 mg total) by mouth daily with breakfast. 30 tablet 0   . Prenatal Vit-Fe Phos-FA-Omega (VITAFOL GUMMIES) 3.33-0.333-34.8 MG CHEW Chew 3 each by mouth daily. 90 tablet 12      Review of Systems:  All systems reviewed and negative except as stated in HPI  PE: Blood pressure 124/79, pulse (!) 107, temperature 99.1 F (37.3 C), temperature source Oral, resp. rate 20, last menstrual period 05/26/2018, currently breastfeeding. General appearance: alert and cooperative, uncomfortable with contractions Lungs: regular rate and effort Heart: regular rate  Abdomen: soft,  non-tender Extremities: Homans sign is negative, no sign of DVT Presentation: cephalic  Genital: no herpetic lesions seen (patient reports no symptoms) EFM: 140-145 bpm, moderate variability, 15x15 accels, no decels Toco: unable to pick up contractions Dilation: 5.5 Effacement (%): 80 Station: -2, -3 Exam by:: Raelyn Mora, CNM  Prenatal labs: ABO, Rh: B/Positive/-- (07/23 1517) Antibody: Negative (07/23 1517) Rubella: 9.30 (07/23 1517) RPR: Non Reactive (11/04 0810)  HBsAg: Negative (07/23 1517)  HIV: Non Reactive (11/04 0810)  GBS:   unknown 2 hr GTT 103/112/70  Prenatal Transfer Tool  Maternal Diabetes: Yes:  Diabetes Type:  Insulin/Medication controlled Genetic Screening: Normal Maternal Ultrasounds/Referrals: Normal Fetal Ultrasounds or other Referrals:  Referred to Materal Fetal Medicine  Maternal Substance Abuse:  No Significant Maternal Medications:  None Significant Maternal Lab Results: GBS UNKNOWN  No results found for this or any previous visit (from the past 24 hour(s)).  Patient Active Problem List   Diagnosis Date Noted  . Indication for care in labor or delivery 02/16/2019  . Gestational diabetes 12/31/2018  . Short interval between pregnancies affecting pregnancy, antepartum 08/21/2018  . Supervision of other normal pregnancy, antepartum 08/29/2017  . 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 Adjustment disorder with depressed mood 02/24/2017    Assessment: Jaclyn Owens is a 24 y.o. G3P2002 at [redacted]w[redacted]d here for active labor.  1. Labor: expectant management 2. FWB: Cat I tracing 3. Pain: per patient request 4. GBS: unknown, treating with ampicillin 5. COVID19: test pending 6. HSV2: on Valtrex, no lesions on exam 7. A2GDM: CBGs q2h.   Plan: Admit to labor and delivery. Anticipate vaginal delivery.  Marlowe Alt, DO  02/16/2019, 9:42 PM

## 2019-02-16 NOTE — Anesthesia Procedure Notes (Signed)
Epidural Patient location during procedure: OB Start time: 02/16/2019 10:35 PM End time: 02/16/2019 11:00 PM  Staffing Anesthesiologist: Lannie Fields, DO Performed: anesthesiologist   Preanesthetic Checklist Completed: patient identified, IV checked, risks and benefits discussed, monitors and equipment checked, pre-op evaluation and timeout performed  Epidural Patient position: sitting Prep: DuraPrep and site prepped and draped Patient monitoring: continuous pulse ox, blood pressure, heart rate and cardiac monitor Approach: midline Location: L3-L4 Injection technique: LOR air  Needle:  Needle type: Tuohy  Needle gauge: 17 G Needle length: 9 cm Needle insertion depth: 6 cm Catheter type: closed end flexible Catheter size: 19 Gauge Catheter at skin depth: 11 cm Test dose: negative  Assessment Sensory level: T8 Events: blood not aspirated, injection not painful, no injection resistance, no paresthesia and negative IV test  Additional Notes Intentional dural puncture with 24G spinal needle to administer fentanyl in intrathecal space, epidural threaded through tuohy uneventfully.Reason for block:procedure for pain

## 2019-02-16 NOTE — Discharge Instructions (Signed)

## 2019-02-17 ENCOUNTER — Other Ambulatory Visit: Payer: Self-pay

## 2019-02-17 ENCOUNTER — Encounter (HOSPITAL_COMMUNITY): Payer: Self-pay | Admitting: Obstetrics and Gynecology

## 2019-02-17 ENCOUNTER — Encounter: Payer: Medicaid Other | Admitting: Family Medicine

## 2019-02-17 DIAGNOSIS — Z30430 Encounter for insertion of intrauterine contraceptive device: Secondary | ICD-10-CM

## 2019-02-17 LAB — CBC
HCT: 32.4 % — ABNORMAL LOW (ref 36.0–46.0)
Hemoglobin: 11.2 g/dL — ABNORMAL LOW (ref 12.0–15.0)
MCH: 29.7 pg (ref 26.0–34.0)
MCHC: 34.6 g/dL (ref 30.0–36.0)
MCV: 85.9 fL (ref 80.0–100.0)
Platelets: 210 10*3/uL (ref 150–400)
RBC: 3.77 MIL/uL — ABNORMAL LOW (ref 3.87–5.11)
RDW: 12.9 % (ref 11.5–15.5)
WBC: 12.1 10*3/uL — ABNORMAL HIGH (ref 4.0–10.5)
nRBC: 0 % (ref 0.0–0.2)

## 2019-02-17 LAB — RPR: RPR Ser Ql: NONREACTIVE

## 2019-02-17 MED ORDER — LIDOCAINE HCL 1 % IJ SOLN
0.0000 mL | Freq: Once | INTRAMUSCULAR | Status: AC | PRN
  Administered 2019-02-17: 20 mL via INTRADERMAL
  Filled 2019-02-17: qty 20

## 2019-02-17 MED ORDER — SIMETHICONE 80 MG PO CHEW: 80.0000 mg | CHEWABLE_TABLET | ORAL | Status: DC | PRN

## 2019-02-17 MED ORDER — DIBUCAINE (PERIANAL) 1 % EX OINT: 1.0000 "application " | TOPICAL_OINTMENT | CUTANEOUS | Status: DC | PRN

## 2019-02-17 MED ORDER — OXYCODONE HCL 5 MG PO TABS
5.0000 mg | ORAL_TABLET | ORAL | Status: DC | PRN
  Administered 2019-02-17 (×2): 5 mg via ORAL
  Filled 2019-02-17 (×2): qty 1

## 2019-02-17 MED ORDER — WITCH HAZEL-GLYCERIN EX PADS: 1.0000 "application " | MEDICATED_PAD | CUTANEOUS | Status: DC | PRN

## 2019-02-17 MED ORDER — PRENATAL MULTIVITAMIN CH
1.0000 | ORAL_TABLET | Freq: Every day | ORAL | Status: DC
  Administered 2019-02-17 – 2019-02-18 (×2): 1 via ORAL
  Filled 2019-02-17 (×2): qty 1

## 2019-02-17 MED ORDER — ETONOGESTREL 68 MG ~~LOC~~ IMPL
68.0000 mg | DRUG_IMPLANT | Freq: Once | SUBCUTANEOUS | Status: AC
  Administered 2019-02-17: 68 mg via SUBCUTANEOUS
  Filled 2019-02-17: qty 1

## 2019-02-17 MED ORDER — TETANUS-DIPHTH-ACELL PERTUSSIS 5-2.5-18.5 LF-MCG/0.5 IM SUSP: 0.5000 mL | Freq: Once | INTRAMUSCULAR | Status: DC

## 2019-02-17 MED ORDER — ONDANSETRON HCL 4 MG PO TABS
4.0000 mg | ORAL_TABLET | ORAL | Status: DC | PRN
  Administered 2019-02-18: 4 mg via ORAL
  Filled 2019-02-17: qty 1

## 2019-02-17 MED ORDER — SENNOSIDES-DOCUSATE SODIUM 8.6-50 MG PO TABS
2.0000 | ORAL_TABLET | ORAL | Status: DC
  Administered 2019-02-17 (×2): 2 via ORAL
  Filled 2019-02-17 (×2): qty 2

## 2019-02-17 MED ORDER — IBUPROFEN 600 MG PO TABS
600.0000 mg | ORAL_TABLET | Freq: Four times a day (QID) | ORAL | Status: DC
  Administered 2019-02-17 – 2019-02-18 (×6): 600 mg via ORAL
  Filled 2019-02-17 (×6): qty 1

## 2019-02-17 MED ORDER — EPHEDRINE SULFATE 50 MG/ML IJ SOLN
INTRAMUSCULAR | Status: DC | PRN
  Administered 2019-02-16 (×5): 10 mg via INTRAVENOUS

## 2019-02-17 MED ORDER — ZOLPIDEM TARTRATE 5 MG PO TABS: 5.0000 mg | ORAL_TABLET | Freq: Every evening | ORAL | Status: DC | PRN

## 2019-02-17 MED ORDER — PHENYLEPHRINE HCL (PRESSORS) 10 MG/ML IV SOLN
INTRAVENOUS | Status: DC | PRN
  Administered 2019-02-16: 80 ug via INTRAVENOUS
  Administered 2019-02-16 (×3): 120 ug via INTRAVENOUS

## 2019-02-17 MED ORDER — BENZOCAINE-MENTHOL 20-0.5 % EX AERO
1.0000 "application " | INHALATION_SPRAY | CUTANEOUS | Status: DC | PRN
  Administered 2019-02-17: 1 via TOPICAL
  Filled 2019-02-17: qty 56

## 2019-02-17 MED ORDER — ONDANSETRON HCL 4 MG/2ML IJ SOLN: 4.0000 mg | INTRAMUSCULAR | Status: DC | PRN

## 2019-02-17 MED ORDER — COCONUT OIL OIL: 1.0000 "application " | TOPICAL_OIL | Status: DC | PRN

## 2019-02-17 MED ORDER — ACETAMINOPHEN 325 MG PO TABS
650.0000 mg | ORAL_TABLET | ORAL | Status: DC | PRN
  Administered 2019-02-17 – 2019-02-18 (×3): 650 mg via ORAL
  Filled 2019-02-17 (×4): qty 2

## 2019-02-17 MED ORDER — DIPHENHYDRAMINE HCL 25 MG PO CAPS
25.0000 mg | ORAL_CAPSULE | Freq: Four times a day (QID) | ORAL | Status: DC | PRN
  Administered 2019-02-17: 25 mg via ORAL
  Filled 2019-02-17 (×2): qty 1

## 2019-02-17 MED ORDER — OXYCODONE HCL 5 MG PO TABS
10.0000 mg | ORAL_TABLET | ORAL | Status: DC | PRN
  Administered 2019-02-18: 10 mg via ORAL
  Filled 2019-02-17: qty 2

## 2019-02-17 NOTE — Procedures (Signed)
Post-Placental Nexplanon Insertion Procedure Note  Patient was identified. Informed consent was signed, signed copy in chart. A time-out was performed.    The insertion site was identified 8-10 cm (3-4 inches) from the medial epicondyle of the humerus and 3-5 cm (1.25-2 inches) posterior to (below) the sulcus (groove) between the biceps and triceps muscles of the patient's right arm and marked. The site was prepped and draped in the usual sterile fashion. Pt was prepped with alcohol swab and then injected with 3 cc of 1% lidocaine. The site was prepped with betadine. Nexplanon removed form packaging,  Device confirmed in needle, then inserted full length of needle and withdrawn per handbook instructions. Provider and patient verified presence of the implant in the woman's arm by palpation. Pt insertion site was covered with steristrips/adhesive bandage and pressure bandage. There was minimal blood loss. Patient tolerated procedure well.  Patient was given post procedure instructions and Nexplanon user card with expiration date. Condoms were recommended for STI prevention x1-2w. Patient was asked to keep the pressure dressing on for 24 hours to minimize bruising. The patient verbalized understanding of the plan of care and agrees.   Expiration Date 02/16/2022   Lorri Frederick, DO, PGY-1 OBGYN Faculty Teaching Service

## 2019-02-17 NOTE — Progress Notes (Signed)
Called to room due to pain and swelling at Nexplanon injection site. Patient had removed the Coban due to it being too tight.   No erythema or abnormal swelling. Area was appropriately tender.   I rewrapped the insertion site with gauze and Coban, recommended ice pack and tylenol/motrin. Patient agreed with plan.  Marlowe Alt, DO OB Fellow, Faculty Practice 02/17/2019 8:41 PM

## 2019-02-17 NOTE — Progress Notes (Addendum)
Post Partum Day #1 Subjective: no complaints, up ad lib and tolerating PO; debating between DMPA and inpatient Nexplanon placement for contraception; R&Bs of both reviewed with pt and she elects Nexplanon; breastfeeding going slowly- mom reports she breastfeed her 23 year old until recently with no problems, but is concerned re this baby's latch  Objective: Blood pressure (!) 100/56, pulse 90, temperature 99.1 F (37.3 C), resp. rate 18, height 5\' 6"  (1.676 m), weight 79.4 kg, last menstrual period 05/26/2018, SpO2 100 %, unknown if currently breastfeeding.  Physical Exam:  General: alert, cooperative and no distress Lochia: appropriate Uterine Fundus: firm DVT Evaluation: No evidence of DVT seen on physical exam.  Recent Labs    02/16/19 2125 02/17/19 0410  HGB 12.8 11.2*  HCT 36.9 32.4*    Assessment/Plan: Plan for discharge tomorrow and Contraception : Nexplanon to be placed later today  LC to see Will get FBS tomorrow as pt wasn't fasting overnight   LOS: 1 day   02/19/19 CNM 02/17/2019, 12:27 PM

## 2019-02-17 NOTE — Anesthesia Postprocedure Evaluation (Signed)
Anesthesia Post Note  Patient: Harla Hoyt  Procedure(s) Performed: AN AD HOC LABOR EPIDURAL     Patient location during evaluation: Mother Baby Anesthesia Type: Epidural Level of consciousness: awake and alert and oriented Pain management: satisfactory to patient Vital Signs Assessment: post-procedure vital signs reviewed and stable Respiratory status: spontaneous breathing and nonlabored ventilation Cardiovascular status: stable Postop Assessment: no headache, no backache, no signs of nausea or vomiting, adequate PO intake, patient able to bend at knees and able to ambulate (patient up walking) Anesthetic complications: no Comments: Patient states that numbness is gone but she feels weak, but is able to ambulate.    Last Vitals:  Vitals:   02/17/19 0823 02/17/19 1149  BP: 108/70 (!) 100/56  Pulse: 96 90  Resp: 18 18  Temp: 37.7 C 37.3 C  SpO2: 100% 100%    Last Pain:  Vitals:   02/17/19 0823  TempSrc: Oral  PainSc: 0-No pain   Pain Goal:                   Madison Hickman

## 2019-02-17 NOTE — Lactation Note (Signed)
This note was copied from a baby's chart. Lactation Consultation Note Baby 4 hrs old. Mom choice on admission was formula. Mom has changed her mind and decided to BF only while she is at the hospital then she is giving formula to the baby and stop BF. Asked mom how long did she BF her other children. Mom stated she is currently still Bf her 23 yr old and isn't going to BF that child anymore. Asked why isn't she going to BF this baby, mom replied that she was tired of BF and tired of her breast hurting because she needs to feed the baby and she has to go back to work. Asked mom if this baby is latching ok mom replied yes. LC tried to talk to mom about BF a newborn but mom was agitated by her itching. Stating that she needed her nurse because her legs where itching so bad she couldn't stand it and wanted LC to get her nurse for her. Mom stated she didn't need any help with the BF. Encouraged mom to call if has any questions or concerns. Lactation brochure given. LC notified RN of pt. Needs.   Patient Name: [name] [id number] Date: 02/17/2019 Reason for consult: Initial assessment;Early term 37-38.6wks   Maternal Data Does the patient have breastfeeding experience prior to this delivery?: Yes  Feeding Feeding Type: Breast Fed  LATCH Score Latch: Grasps breast easily, tongue down, lips flanged, rhythmical sucking.  Audible Swallowing: A few with stimulation  Type of Nipple: Everted at rest and after stimulation  Comfort (Breast/Nipple): Soft / non-tender  Hold (Positioning): Assistance needed to correctly position infant at breast and maintain latch.  LATCH Score: 8  Interventions Interventions: Breast feeding basics reviewed  Lactation Tools Discussed/Used WIC Program: Yes   Consult Status Consult Status: Follow-up Date: 02/18/19 Follow-up type: In-patient    Ivelisse Culverhouse, Diamond Nickel 02/17/2019, 3:09 AM

## 2019-02-17 NOTE — Progress Notes (Signed)
MOB was referred for history of depression/anxiety.  * Referral screened out by Clinical Social Worker because none of the following criteria appear to apply:  ~ History of anxiety/depression during this pregnancy, or of post-partum depression following prior delivery. ~ Diagnosis of anxiety and/or depression within last 3 years OR * MOB's symptoms currently being treated with medication and/or therapy.  Please contact the Clinical Social Worker if needs arise, by MOB request, or if MOB scores greater than 9/yes to question 10 on Edinburgh Postpartum Depression Screen.  Sherlin Sonier, LCSW [facility] [phone]  

## 2019-02-17 NOTE — Plan of Care (Signed)
  Problem: Education: Goal: Knowledge of condition will improve Outcome: Completed/Met   Problem: Life Cycle: Goal: Chance of risk for complications during the postpartum period will decrease Outcome: Completed/Met   Problem: Role Relationship: Goal: Ability to demonstrate positive interaction with newborn will improve Outcome: Completed/Met   Problem: Education: Goal: Knowledge of condition will improve Outcome: Completed/Met   Problem: Life Cycle: Goal: Chance of risk for complications during the postpartum period will decrease Outcome: Completed/Met   Problem: Role Relationship: Goal: Ability to demonstrate positive interaction with newborn will improve Outcome: Completed/Met   

## 2019-02-18 LAB — GLUCOSE, CAPILLARY: Glucose-Capillary: 77 mg/dL (ref 70–99)

## 2019-02-18 MED ORDER — ONDANSETRON 4 MG PO TBDP: 4.0000 mg | ORAL_TABLET | Freq: Two times a day (BID) | ORAL | Status: DC

## 2019-02-18 MED ORDER — IBUPROFEN 600 MG PO TABS: 600.0000 mg | ORAL_TABLET | Freq: Four times a day (QID) | ORAL | 0 refills | Status: DC

## 2019-02-18 MED ORDER — METOCLOPRAMIDE HCL 10 MG PO TABS: 10.0000 mg | ORAL_TABLET | Freq: Four times a day (QID) | ORAL | Status: DC | PRN

## 2019-02-18 MED ORDER — ONDANSETRON 4 MG PO TBDP: 4.0000 mg | ORAL_TABLET | Freq: Two times a day (BID) | ORAL | 0 refills | Status: DC

## 2019-02-18 NOTE — Lactation Note (Signed)
This note was copied from a baby's chart. Lactation Consultation Note  Patient Name: Jaclyn Owens Date: 02/18/2019 Reason for consult: Follow-up assessment Mom states she has decided to only formula feed.  Instructed on using ice and cabbage leaves for comfort.  No further questions or concerns.  Maternal Data    Feeding Feeding Type: Bottle Fed - Formula Nipple Type: Slow - flow  LATCH Score Latch: Grasps breast easily, tongue down, lips flanged, rhythmical sucking.  Audible Swallowing: A few with stimulation  Type of Nipple: Everted at rest and after stimulation  Comfort (Breast/Nipple): Soft / non-tender  Hold (Positioning): No assistance needed to correctly position infant at breast.  LATCH Score: 9  Interventions    Lactation Tools Discussed/Used     Consult Status Consult Status: Complete Follow-up type: Call as needed    Huston Foley 02/18/2019, 9:02 AM

## 2019-02-18 NOTE — Discharge Summary (Addendum)
Postpartum Discharge Summary  Date of Service updated     Patient Name: Jaclyn Owens DOB: 1995-04-05 MRN: [id number]  Date of admission: 02/16/2019 Delivering Provider: Sloan Leiter   Date of discharge: 02/18/2019  Admitting diagnosis: Indication for care in labor or delivery [O75.9] Intrauterine pregnancy: [redacted]w[redacted]d    Secondary diagnosis:  Active Problems:   Indication for care in labor or delivery  Additional problems: GDM A2- not taking meds     Discharge diagnosis: Term Pregnancy Delivered and GDM A2                                                                                                Post partum procedures:Nexplanon  Augmentation: AROM  Complications: suspect possible placental abruption  Hospital course:  Onset of Labor With Vaginal Delivery     24y.o. yo G3P3003 at 369w0das admitted in Active Labor on 02/16/2019. Patient had an uncomplicated labor course as follows: she arrived in MAU with spontaneous labor, was 5 cm dilated, admitted to labor and delivery.  Progress on her own over two hours and had a vaginal delivery with possible placental abruption.  Membrane Rupture Time/Date: 10:54 PM ,02/16/2019   Intrapartum Procedures: Episiotomy: None [1]                                         Lacerations:  1st degree [2]  Patient had a delivery of a Viable infant. 02/16/2019  Information for the patient's newborn:  DiMerl, Guardino0[[id number]]Delivery Method: Vag-Spont     Pateint had an uncomplicated postpartum course.  She is ambulating, tolerating a regular diet, passing flatus, and urinating well. Patient is discharged home in stable condition on 02/18/19.  Delivery time: 10:58 PM    Magnesium Sulfate received: No BMZ received: No Rhophylac:No MMR:No Transfusion:No  Physical exam  Vitals:   02/18/19 0511 02/18/19 1043 02/18/19 1047 02/18/19 1053  BP: 102/67 110/77 110/74 106/81  Pulse: 72 90 67 73  Resp:      Temp: 97.7 F (36.5 C)      TempSrc: Oral     SpO2: 100% 100% 100% 99%  Weight:      Height:       General: alert and cooperative Lochia: appropriate Uterine Fundus: firm, U-1 Incision: laceration approximated DVT Evaluation: No evidence of DVT seen on physical exam. Negative Homan's sign. No cords or calf tenderness. No significant calf/ankle edema. Labs: Lab Results  Component Value Date   WBC 12.1 (H) 02/17/2019   HGB 11.2 (L) 02/17/2019   HCT 32.4 (L) 02/17/2019   MCV 85.9 02/17/2019   PLT 210 02/17/2019   CMP Latest Ref Rng & Units 06/01/2018  Glucose 70 - 99 mg/dL 118(H)  BUN 6 - 20 mg/dL 8  Creatinine 0.44 - 1.00 mg/dL 1.05(H)  Sodium 135 - 145 mmol/L 142  Potassium 3.5 - 5.1 mmol/L 3.2(L)  Chloride 98 - 111 mmol/L 107  CO2 22 - 32 mmol/L 19(L)  Calcium 8.9 -  10.3 mg/dL 9.2  Total Protein 6.5 - 8.1 g/dL -  Total Bilirubin 0.3 - 1.2 mg/dL -  Alkaline Phos 38 - 126 U/L -  AST 15 - 41 U/L -  ALT 14 - 54 U/L -    Discharge instruction: per After Visit Summary and "Baby and Me Booklet".  After visit meds:  Allergies as of 02/18/2019      Reactions   Bee Venom Anaphylaxis      Medication List    STOP taking these medications   Accu-Chek FastClix Lancets Misc   Accu-Chek Guide test strip Generic drug: glucose blood   acetaminophen 325 MG tablet Commonly known as: Tylenol   diphenhydrAMINE 25 MG tablet Commonly known as: SOMINEX   glyBURIDE 2.5 MG tablet Commonly known as: DIABETA   Vitafol Gummies 3.33-0.333-34.8 MG Chew     TAKE these medications   ibuprofen 600 MG tablet Commonly known as: ADVIL Take 1 tablet (600 mg total) by mouth every 6 (six) hours.   ondansetron 4 MG disintegrating tablet Commonly known as: ZOFRAN-ODT Take 1 tablet (4 mg total) by mouth every 12 (twelve) hours.       Diet: routine diet  Activity: Advance as tolerated. Pelvic rest for 6 weeks.   Outpatient follow up:4 weeks Follow up Appt: No future appointments. Follow up  Visit:    Please schedule this patient for Postpartum visit in: 4 weeks with the following provider: MD In-Person For C/S patients schedule nurse incision check in weeks 2 weeks: no High risk pregnancy complicated by: GDM Delivery mode:  SVD Anticipated Birth Control:  PP Nexplanon placed PP Procedures needed: 2 hour GTT  Schedule Integrated BH visit: no     Newborn Data: Live born female  Birth Weight: 6 lb 4.2 oz (2841 g) APGAR: 8, 9  Newborn Delivery   Birth date/time: 02/16/2019 22:58:00 Delivery type: Vaginal, Spontaneous      Baby Feeding: Bottle and Breast Disposition:home with mother   02/18/2019 Marylouise Stacks

## 2019-02-18 NOTE — Progress Notes (Addendum)
POSTPARTUM PROGRESS NOTE // SIGNIFICANT EVENT  Post Partum Day 2  Subjective:  Jaclyn Owens is a 24 y.o. [id number] s/p SVD at [redacted]w[redacted]d.  Was notified by nursing team that she began calling out with chest pain, dyspnea, nausea and vomiting. When I interviewed the patient she endorsed frontal and b/l temporal headache. Denies prior cardiac history.   Patient actively vomiting as I was completing my visit with her. Vomit is yellow-brown in color, as patient just recently ate a meal.  Objective: Blood pressure 106/81, pulse 73, temperature 97.7 F (36.5 C), temperature source Oral, resp. rate 18, height 5\' 6"  (1.676 m), weight 79.4 kg, last menstrual period 05/26/2018, SpO2 99 %, unknown if currently breastfeeding.  Physical Exam:  General: alert, cooperative mild discomfort/distress Chest: mild respiratory distress w/ associated SOB. Heart:regular rate, distal pulses intact Abdomen: soft, nontender,  Uterine Fundus: firm, appropriately tender DVT Evaluation: No calf swelling or tenderness Extremities: no edema. Inspected R arm where Nexplanon was placed yesterday - no edema, small area of bruising at the proximal aspect, Nexplanon easily palpable in proper site. Skin: warm, dry  Recent Labs    02/16/19 2125 02/17/19 0410  HGB 12.8 11.2*  HCT 36.9 32.4*    Assessment/Plan: Jaclyn Owens is a 24 y.o. 21 s/p SVD at [redacted]w[redacted]d   PPD#2 - Doing well. Continue routine postpartum care.  #Nausea/vomiting/headache - Etiology likely migraine headache with associated n/v, though will evaluate cardiac symptoms (see below). Continue scheduled ibuprofen/tylenol for pain, PRN zofran dissolvable for nausea and reglan.  #Chest pain/dyspnea - STAT EKG pending. Reviewed prior EKGs. Contraception: Nexplanon (placed 02/17/19) Feeding: breast Dispo: Plan for discharge today, though holding discharge orders for now until we can fully evaluate/manage current chief complaint.   LOS: 2 days    02/19/19, DO, PGY-1 OBGYN Faculty Teaching Service  02/18/2019, 11:16 AM

## 2019-02-18 NOTE — Progress Notes (Addendum)
Post Partum Day 2, vaginal delivery Subjective: no complaints, up ad lib, voiding, tolerating PO and + flatus  Objective: Blood pressure 102/67, pulse 72, temperature 97.7 F (36.5 C), temperature source Oral, resp. rate 18, height 5\' 6"  (1.676 m), weight 79.4 kg, last menstrual period 05/26/2018, SpO2 100 %, unknown if currently breastfeeding.  Physical Exam:  General: alert, cooperative and no distress Lochia: appropriate, scant rubra Uterine Fundus: firm, U-1 Laceration: approximated DVT Evaluation: No evidence of DVT seen on physical exam. Negative Homan's sign. No cords or calf tenderness. No significant calf/ankle edema.  Recent Labs    02/16/19 2125 02/17/19 0410  HGB 12.8 11.2*  HCT 36.9 32.4*    Assessment/Plan: Discharge home for self care today 02/18/19   LOS: 2 days   02/20/19, SNM 02/18/2019, 7:38 AM    I confirm that I have verified the information documented in the nurse midwife student's note and that I have also personally reperformed the history, physical exam and all medical decision making activities of this service and have verified that all service and findings are accurately documented in this student's note.    02/20/2019, CNM 02/18/2019 9:59 AM

## 2019-02-19 ENCOUNTER — Ambulatory Visit (HOSPITAL_COMMUNITY): Admission: RE | Admit: 2019-02-19 | Payer: Medicaid Other | Source: Ambulatory Visit

## 2019-02-19 ENCOUNTER — Ambulatory Visit (HOSPITAL_COMMUNITY): Payer: Medicaid Other

## 2019-02-19 LAB — SURGICAL PATHOLOGY

## 2019-03-26 ENCOUNTER — Telehealth (INDEPENDENT_AMBULATORY_CARE_PROVIDER_SITE_OTHER): Payer: Medicaid Other | Admitting: Advanced Practice Midwife

## 2019-03-26 DIAGNOSIS — Z1389 Encounter for screening for other disorder: Secondary | ICD-10-CM

## 2019-03-26 MED ORDER — DOCUSATE SODIUM 50 MG PO CAPS: 50.0000 mg | ORAL_CAPSULE | Freq: Two times a day (BID) | ORAL | 0 refills | Status: DC

## 2019-03-26 NOTE — Progress Notes (Signed)
I connected with Jaclyn Owens on 03/26/19 at  3:35 PM EST by: Telephone and verified that I am speaking with the correct person using two identifiers.  Patient is located at [facility] and provider is located at [place].     The purpose of this virtual visit is to provide medical care while limiting exposure to the novel coronavirus. I discussed the limitations, risks, security and privacy concerns of performing an evaluation and management service by Mychart and the availability of in person appointments. I also discussed with the patient that there may be a patient responsible charge related to this service. By engaging in this virtual visit, you consent to the provision of healthcare.  Additionally, you authorize for your insurance to be billed for the services provided during this visit.  The patient expressed understanding and agreed to proceed.  The following staff members participated in the virtual visit:  Thressa Sheller, CNM    Appointment Date: 03/26/2019  [facility]   Chief Complaint:  Postpartum Visit  History of Present Illness: Jaclyn Owens is a 24 y.o. African-American [phone] (Patient's last menstrual period was 05/26/2018 (exact date).), seen for the above chief complaint. Her past medical history is significant forA2GDM   She is s/p normal spontaneous vaginal delivery on 02/16/2019 at 37 weeks; she was discharged to home on PPD#2. Pregnancy complicated by A2GDM. Baby is doing well.  Complains of none  Vaginal bleeding or discharge: No  Mode of feeding infant: Bottle Intercourse: No  Contraception: Nexplanon PP depression s/s: Yes .  Any bowel or bladder issues: No  Pap smear: no abnormalities (date: 08/30/2018)  Review of Systems: Positive for difficulty sleeping. Her 12 point review of systems is negative or as noted in the History of Present Illness.  Patient Active Problem List   Diagnosis Date Noted  . Indication for care in labor or delivery 02/16/2019  .  Gestational diabetes 12/31/2018  . Short interval between pregnancies affecting pregnancy, antepartum 08/21/2018  . Supervision of other normal pregnancy, antepartum 08/29/2017  . 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 Adjustment disorder with depressed mood 02/24/2017    Medications Jaclyn Owens had no medications administered during this visit. Current Outpatient Medications  Medication Sig Dispense Refill  . docusate sodium (COLACE) 50 MG capsule Take 1 capsule (50 mg total) by mouth 2 (two) times daily. 10 capsule 0  . ibuprofen (ADVIL) 600 MG tablet Take 1 tablet (600 mg total) by mouth every 6 (six) hours. 30 tablet 0  . ondansetron (ZOFRAN-ODT) 4 MG disintegrating tablet Take 1 tablet (4 mg total) by mouth every 12 (twelve) hours. 20 tablet 0   No current facility-administered medications for this visit.    Allergies Bee venom  Physical Exam:  Physical Exam  Limited due to virtual visit   PP Depression Screening:   Edinburgh Postnatal Depression Scale - 03/26/19 1550      Edinburgh Postnatal Depression Scale:  In the Past 7 Days   I have been able to laugh and see the funny side of things.  0    I have looked forward with enjoyment to things.  0    I have blamed myself unnecessarily when things went wrong.  0    I have been anxious or worried for no good reason.  0    I have felt scared or panicky for no good reason.  0    Things have been getting on top of me.  1    I have been  so unhappy that I have had difficulty sleeping.  1    I have felt sad or miserable.  1    I have been so unhappy that I have been crying.  0    The thought of harming myself has occurred to me.  0    Edinburgh Postnatal Depression Scale Total  3       Assessment:Patient is a 24 y.o. [id number] who is 6 weeks postpartum from a normal spontaneous vaginal delivery.  She is doing well.   Plan:  1. Postpartum care and examination - patient would like refill on Seroquel to help  with sleeping. She states she was seeing a therapist and they were prescribed at their office. She has not seen them during the pregnancy. Patient advised to call them for follow up visit as this is not a medication we typically manage here in this office.    RTC 1 year    Marcille Buffy DNP, CNM  03/26/19  4:02 PM  [facility]

## 2019-03-31 ENCOUNTER — Other Ambulatory Visit: Payer: Self-pay

## 2019-04-01 ENCOUNTER — Encounter: Payer: Self-pay | Admitting: Obstetrics and Gynecology

## 2019-04-01 ENCOUNTER — Other Ambulatory Visit: Payer: Medicaid Other

## 2019-04-14 DIAGNOSIS — H40033 Anatomical narrow angle, bilateral: Secondary | ICD-10-CM | POA: Diagnosis not present

## 2019-04-14 DIAGNOSIS — G44209 Tension-type headache, unspecified, not intractable: Secondary | ICD-10-CM | POA: Diagnosis not present

## 2019-05-01 DIAGNOSIS — H04123 Dry eye syndrome of bilateral lacrimal glands: Secondary | ICD-10-CM | POA: Diagnosis not present

## 2019-05-03 DIAGNOSIS — H5213 Myopia, bilateral: Secondary | ICD-10-CM | POA: Diagnosis not present

## 2019-05-09 DIAGNOSIS — H1013 Acute atopic conjunctivitis, bilateral: Secondary | ICD-10-CM | POA: Diagnosis not present

## 2019-05-09 DIAGNOSIS — H5213 Myopia, bilateral: Secondary | ICD-10-CM | POA: Diagnosis not present

## 2019-08-03 ENCOUNTER — Emergency Department (HOSPITAL_COMMUNITY)
Admission: EM | Admit: 2019-08-03 | Discharge: 2019-08-03 | Disposition: A | Discharge status: Home / Self Care | Payer: Medicaid Other | Attending: Emergency Medicine | Admitting: Emergency Medicine

## 2019-08-03 ENCOUNTER — Encounter (HOSPITAL_COMMUNITY): Payer: Self-pay

## 2019-08-03 DIAGNOSIS — Z87891 Personal history of nicotine dependence: Secondary | ICD-10-CM | POA: Diagnosis not present

## 2019-08-03 DIAGNOSIS — R05 Cough: Secondary | ICD-10-CM | POA: Insufficient documentation

## 2019-08-03 DIAGNOSIS — R059 Cough, unspecified: Secondary | ICD-10-CM

## 2019-08-03 DIAGNOSIS — J029 Acute pharyngitis, unspecified: Secondary | ICD-10-CM | POA: Insufficient documentation

## 2019-08-03 MED ORDER — BENZONATATE 100 MG PO CAPS: 100.0000 mg | ORAL_CAPSULE | Freq: Three times a day (TID) | ORAL | 0 refills | Status: DC | PRN

## 2019-08-03 NOTE — ED Provider Notes (Signed)
[facility] EMERGENCY DEPARTMENT Provider Note   CSN: [id number] Arrival date & time: 08/03/19  1622     History Chief Complaint  Patient presents with  . Cough    Calvin Chura is a 24 y.o. female.  HPI Patient is a 24 year old female who presents due to a cough.  She states about 1 week ago she was experiencing a sore throat as well as rhinorrhea/congestion.  Her symptoms alleviated and for the last few days has been experiencing an intermittent cough.  She reports her that it is productive with clear phlegm.  She reports mild shortness of breath with exertion.  She has been taking NyQuil with short-term relief.  She denies fevers, chills, chest pain, abdominal pain, nausea, vomiting, syncope.    Past Medical History:  Diagnosis Date  . Asthma   . HSV (herpes simplex virus) infection     Patient Active Problem List   Diagnosis Date Noted  . Indication for care in labor or delivery 02/16/2019  . Gestational diabetes 12/31/2018  . Short interval between pregnancies affecting pregnancy, antepartum 08/21/2018  . Supervision of other normal pregnancy, antepartum 08/29/2017  . 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 Adjustment disorder with depressed mood 02/24/2017    Past Surgical History:  Procedure Laterality Date  . ARM WOUND REPAIR / CLOSURE       OB History    Gravida  3   Para  3   Term  3   Preterm  0   AB  0   Living  3     SAB  0   TAB  0   Ectopic  0   Multiple  0   Live Births  3           Family History  Problem Relation Age of Onset  . Asthma Mother   . Diabetes Maternal Aunt   . Cancer Maternal Grandmother     Social History   Tobacco Use  . Smoking status: Former Smoker    Types: Cigarettes, Cigars  . Smokeless tobacco: Never Used  Vaping Use  . Vaping Use: Never used  Substance Use Topics  . Alcohol use: Not Currently  . Drug use: No    Types: Marijuana    Comment: MJ use 01-24-15     Home Medications Prior to Admission medications   Medication Sig Start Date End Date Taking? Authorizing Provider  docusate sodium (COLACE) 50 MG capsule Take 1 capsule (50 mg total) by mouth 2 (two) times daily. 03/26/19   Thressa Sheller D, CNM  ibuprofen (ADVIL) 600 MG tablet Take 1 tablet (600 mg total) by mouth every 6 (six) hours. 02/18/19   Marylene Land, CNM  ondansetron (ZOFRAN-ODT) 4 MG disintegrating tablet Take 1 tablet (4 mg total) by mouth every 12 (twelve) hours. 02/18/19   Conan Bowens, MD    Allergies    Bee venom  Review of Systems   Review of Systems  Constitutional: Negative for chills and fever.  HENT: Negative for congestion, rhinorrhea, sneezing, sore throat, trouble swallowing and voice change.   Respiratory: Positive for cough and shortness of breath. Negative for chest tightness and wheezing.   Cardiovascular: Negative for chest pain.  Gastrointestinal: Negative for abdominal pain, nausea and vomiting.    Physical Exam Updated Vital Signs BP 107/72 (BP Location: Left Arm)   Pulse 97   Temp 98.7 F (37.1 C) (Oral)   Resp 18   SpO2 100%   Physical  Exam Vitals and nursing note reviewed.  Constitutional:      General: She is not in acute distress.    Appearance: Normal appearance. She is not ill-appearing, toxic-appearing or diaphoretic.  HENT:     Head: Normocephalic and atraumatic.     Right Ear: External ear normal.     Left Ear: External ear normal.     Nose: Nose normal.     Mouth/Throat:     Mouth: Mucous membranes are moist.     Pharynx: Oropharynx is clear. No oropharyngeal exudate or posterior oropharyngeal erythema.  Eyes:     General: No scleral icterus.       Right eye: No discharge.        Left eye: No discharge.     Extraocular Movements: Extraocular movements intact.     Conjunctiva/sclera: Conjunctivae normal.  Cardiovascular:     Rate and Rhythm: Normal rate and regular rhythm.     Pulses: Normal pulses.      Heart sounds: Normal heart sounds. No murmur heard.  No friction rub. No gallop.   Pulmonary:     Effort: Pulmonary effort is normal. No respiratory distress.     Breath sounds: Normal breath sounds. No stridor. No wheezing, rhonchi or rales.  Abdominal:     General: Abdomen is flat.     Palpations: Abdomen is soft.     Tenderness: There is no abdominal tenderness.  Musculoskeletal:        General: Normal range of motion.     Cervical back: Normal range of motion and neck supple. No tenderness.  Skin:    General: Skin is warm and dry.  Neurological:     General: No focal deficit present.     Mental Status: She is alert and oriented to person, place, and time.  Psychiatric:        Mood and Affect: Mood normal.        Behavior: Behavior normal.    ED Results / Procedures / Treatments   Labs (all labs ordered are listed, but only abnormal results are displayed) Labs Reviewed - No data to display  EKG None  Radiology No results found.  Procedures Procedures   Medications Ordered in ED Medications - No data to display  ED Course  I have reviewed the triage vital signs and the nursing notes.  Pertinent labs & imaging results that were available during my care of the patient were reviewed by me and considered in my medical decision making (see chart for details).    MDM Rules/Calculators/A&P                          Patient is a 24 year old female with a history and physical exam as noted above.  Physical exam seems consistent with cough secondary to viral URI.  Patient has not been vaccinated for COVID-19.  Physical exam is reassuring.  Afebrile at this time.  She is not tachycardic.  Lungs are clear to auscultation bilaterally.  Offered to have a chest x-ray obtained at this time but patient declined.  We will discharge with a short course of Tessalon Perles.  Discussed multiple over-the-counter products for management of her symptoms.  Her questions were answered and  she was amicable at time of discharge.  Her vital signs are stable.  She is saturating at 100%.  Patient discharged to home/self care.  Condition at discharge: Stable  Note: Portions of this report may have been transcribed using  voice recognition software. Every effort was made to ensure accuracy; however, inadvertent computerized transcription errors may be present.    Final Clinical Impression(s) / ED Diagnoses Final diagnoses:  Cough    Rx / DC Orders ED Discharge Orders         Ordered    benzonatate (TESSALON) 100 MG capsule  3 times daily PRN     Discontinue  Reprint     08/03/19 1844           Placido Sou, PA-C 08/03/19 1847    Curatolo, Adam, DO 08/03/19 2100

## 2019-08-03 NOTE — ED Triage Notes (Signed)
Pt arrives to ED w/ c/o cough x 1 week. Pt states she recently got over a cold but has not been able to get rid of cough. Pt denies fever, sob.

## 2019-08-03 NOTE — Discharge Instructions (Signed)
I prescribed you medication called Tessalon Perles.  You can take these as needed for management of your cough.  Please consider other over-the-counter medications such as Delsym, TheraFlu, NyQuil, DayQuil.  You can always return to the emergency department if your symptoms worsen.  It was a pleasure to meet you.

## 2019-08-03 NOTE — ED Notes (Signed)
Pt verbalized understanding of discharge instructions. Follow up care and prescriptions reviewed, pt had no further questions. Ambulated independently to lobby w/ kids.

## 2019-12-03 ENCOUNTER — Other Ambulatory Visit: Payer: Self-pay

## 2019-12-03 ENCOUNTER — Ambulatory Visit (HOSPITAL_COMMUNITY)
Admission: EM | Admit: 2019-12-03 | Discharge: 2019-12-03 | Disposition: A | Discharge status: Home / Self Care | Payer: Medicaid Other | Attending: Family Medicine | Admitting: Family Medicine

## 2019-12-03 DIAGNOSIS — J069 Acute upper respiratory infection, unspecified: Secondary | ICD-10-CM | POA: Diagnosis not present

## 2019-12-03 DIAGNOSIS — Z87891 Personal history of nicotine dependence: Secondary | ICD-10-CM | POA: Insufficient documentation

## 2019-12-03 DIAGNOSIS — Z20822 Contact with and (suspected) exposure to covid-19: Secondary | ICD-10-CM | POA: Insufficient documentation

## 2019-12-03 DIAGNOSIS — R051 Acute cough: Secondary | ICD-10-CM | POA: Insufficient documentation

## 2019-12-03 DIAGNOSIS — H66002 Acute suppurative otitis media without spontaneous rupture of ear drum, left ear: Secondary | ICD-10-CM | POA: Diagnosis not present

## 2019-12-03 LAB — SARS CORONAVIRUS 2 (TAT 6-24 HRS): SARS Coronavirus 2: NEGATIVE

## 2019-12-03 MED ORDER — AMOXICILLIN 500 MG PO CAPS: 1000.0000 mg | ORAL_CAPSULE | Freq: Two times a day (BID) | ORAL | 0 refills | Status: AC

## 2019-12-03 MED ORDER — CETIRIZINE HCL 10 MG PO CAPS: 10.0000 mg | ORAL_CAPSULE | Freq: Every day | ORAL | 0 refills | Status: DC

## 2019-12-03 NOTE — ED Triage Notes (Signed)
Pt reports cough ,runnynose and HA for last 3-7 days. Pt denies any fever ,nausea or vomiting.

## 2019-12-03 NOTE — ED Provider Notes (Signed)
[facility]    CSN: [id number] Arrival date & time: 12/03/19  1035      History   Chief Complaint Chief Complaint  Patient presents with  . Cough  . Nasal Congestion  . Headache    HPI Jaclyn Owens is a 24 y.o. female presenting today for evaluation of cough and congestion.  Patient has had cough and congestion for approximately 4 to 5 days.  Children here with similar symptoms.  Denies any fevers.  Denies GI symptoms.  Is breast-feeding her 45-month-old.  Has had some left ear discomfort, reports history of recurring ear infections in this ear.  HPI  Past Medical History:  Diagnosis Date  . Asthma   . HSV (herpes simplex virus) infection     Patient Active Problem List   Diagnosis Date Noted  . Indication for care in labor or delivery 02/16/2019  . Gestational diabetes 12/31/2018  . Short interval between pregnancies affecting pregnancy, antepartum 08/21/2018  . Supervision of other normal pregnancy, antepartum 08/29/2017  . 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 Adjustment disorder with depressed mood 02/24/2017    Past Surgical History:  Procedure Laterality Date  . ARM WOUND REPAIR / CLOSURE      OB History    Gravida  3   Para  3   Term  3   Preterm  0   AB  0   Living  3     SAB  0   TAB  0   Ectopic  0   Multiple  0   Live Births  3            Home Medications    Prior to Admission medications   Medication Sig Start Date End Date Taking? Authorizing Provider  amoxicillin (AMOXIL) 500 MG capsule Take 2 capsules (1,000 mg total) by mouth 2 (two) times daily for 10 days. 12/03/19 12/13/19  Johnson Arizola C, PA-C  Cetirizine HCl 10 MG CAPS Take 1 capsule (10 mg total) by mouth daily for 10 days. 12/03/19 12/13/19  Damilola Flamm, Junius Creamer, PA-C    Family History Family History  Problem Relation Age of Onset  . Asthma Mother   . Diabetes Maternal Aunt   . Cancer Maternal Grandmother     Social  History Social History   Tobacco Use  . Smoking status: Former Smoker    Types: Cigarettes, Cigars  . Smokeless tobacco: Never Used  Vaping Use  . Vaping Use: Never used  Substance Use Topics  . Alcohol use: Not Currently  . Drug use: No    Types: Marijuana    Comment: MJ use 01-24-15     Allergies   Bee venom   Review of Systems Review of Systems  Constitutional: Negative for activity change, appetite change, chills, fatigue and fever.  HENT: Positive for congestion, ear pain and rhinorrhea. Negative for sinus pressure, sore throat and trouble swallowing.   Eyes: Negative for discharge and redness.  Respiratory: Positive for cough. Negative for chest tightness and shortness of breath.   Cardiovascular: Negative for chest pain.  Gastrointestinal: Negative for abdominal pain, diarrhea, nausea and vomiting.  Musculoskeletal: Negative for myalgias.  Skin: Negative for rash.  Neurological: Negative for dizziness, light-headedness and headaches.     Physical Exam Triage Vital Signs ED Triage Vitals  Enc Vitals Group     BP      Pulse      Resp      Temp  Temp src      SpO2      Weight      Height      Head Circumference      Peak Flow      Pain Score      Pain Loc      Pain Edu?      Excl. in GC?    No data found.  Updated Vital Signs Pulse 81   Temp 98.2 F (36.8 C) (Oral)   Resp 18   Ht 5\' 6"  (1.676 m)   Wt 144 lb (65.3 kg)   LMP  (LMP Unknown)   SpO2 100%   BMI 23.24 kg/m   Blood pressure 114/72 left arm sitting  Visual Acuity Right Eye Distance:   Left Eye Distance:   Bilateral Distance:    Right Eye Near:   Left Eye Near:    Bilateral Near:     Physical Exam Vitals and nursing note reviewed.  Constitutional:      Appearance: She is well-developed.     Comments: No acute distress  HENT:     Head: Normocephalic and atraumatic.     Ears:     Comments: Bilateral ears without tenderness to palpation of external auricle, tragus and  mastoid, EAC's without erythema or swelling,   Left TM opaque and erythematous, dull, landmarks not clearly defined     Nose: Nose normal.     Mouth/Throat:     Comments: Oral mucosa pink and moist, no tonsillar enlargement or exudate. Posterior pharynx patent and nonerythematous, no uvula deviation or swelling. Normal phonation. Eyes:     Conjunctiva/sclera: Conjunctivae normal.  Cardiovascular:     Rate and Rhythm: Normal rate.  Pulmonary:     Effort: Pulmonary effort is normal. No respiratory distress.     Comments: Breathing comfortably at rest, CTABL, no wheezing, rales or other adventitious sounds auscultated Abdominal:     General: There is no distension.  Musculoskeletal:        General: Normal range of motion.     Cervical back: Neck supple.  Skin:    General: Skin is warm and dry.  Neurological:     Mental Status: She is alert and oriented to person, place, and time.      UC Treatments / Results  Labs (all labs ordered are listed, but only abnormal results are displayed) Labs Reviewed  SARS CORONAVIRUS 2 (TAT 6-24 HRS)    EKG   Radiology No results found.  Procedures Procedures (including critical care time)  Medications Ordered in UC Medications - No data to display  Initial Impression / Assessment and Plan / UC Course  I have reviewed the triage vital signs and the nursing notes.  Pertinent labs & imaging results that were available during my care of the patient were reviewed by me and considered in my medical decision making (see chart for details).     Suspect viral URI with secondary otitis media on left side.  Patient is breast-feeding.  Providing amoxicillin x10 days, cetirizine for congestion, Tylenol and ibuprofen as needed for discomfort.  Push fluids.  Covid test pending.  Discussed strict return precautions. Patient verbalized understanding and is agreeable with plan.  Final Clinical Impressions(s) / UC Diagnoses   Final diagnoses:   Viral URI with cough  Non-recurrent acute suppurative otitis media of left ear without spontaneous rupture of tympanic membrane     Discharge Instructions     Begin amoxicillin twice daily x 10 days for  ear infection Daily cetirizine for congestion Tylenol and ibuprofen as needed Return if not improving or worsening    ED Prescriptions    Medication Sig Dispense Auth. Provider   amoxicillin (AMOXIL) 500 MG capsule Take 2 capsules (1,000 mg total) by mouth 2 (two) times daily for 10 days. 40 capsule Paisleigh Maroney C, PA-C   Cetirizine HCl 10 MG CAPS Take 1 capsule (10 mg total) by mouth daily for 10 days. 10 capsule Bram Hottel, [place] C, PA-C     PDMP not reviewed this encounter.   Lew Dawes, PA-C 12/03/19 1424

## 2019-12-03 NOTE — Discharge Instructions (Addendum)
Begin amoxicillin twice daily x 10 days for ear infection Daily cetirizine for congestion Tylenol and ibuprofen as needed Return if not improving or worsening

## 2019-12-10 ENCOUNTER — Ambulatory Visit (HOSPITAL_COMMUNITY)
Admission: EM | Admit: 2019-12-10 | Discharge: 2019-12-10 | Disposition: A | Discharge status: Home / Self Care | Payer: Medicaid Other | Attending: Family Medicine | Admitting: Family Medicine

## 2019-12-10 ENCOUNTER — Other Ambulatory Visit: Payer: Self-pay

## 2019-12-10 ENCOUNTER — Encounter (HOSPITAL_COMMUNITY): Payer: Self-pay

## 2019-12-10 DIAGNOSIS — J069 Acute upper respiratory infection, unspecified: Secondary | ICD-10-CM | POA: Insufficient documentation

## 2019-12-10 DIAGNOSIS — Z792 Long term (current) use of antibiotics: Secondary | ICD-10-CM | POA: Diagnosis not present

## 2019-12-10 DIAGNOSIS — R051 Acute cough: Secondary | ICD-10-CM | POA: Diagnosis not present

## 2019-12-10 DIAGNOSIS — Z87891 Personal history of nicotine dependence: Secondary | ICD-10-CM | POA: Diagnosis not present

## 2019-12-10 DIAGNOSIS — R112 Nausea with vomiting, unspecified: Secondary | ICD-10-CM

## 2019-12-10 DIAGNOSIS — Z20822 Contact with and (suspected) exposure to covid-19: Secondary | ICD-10-CM | POA: Insufficient documentation

## 2019-12-10 DIAGNOSIS — R197 Diarrhea, unspecified: Secondary | ICD-10-CM

## 2019-12-10 DIAGNOSIS — J45909 Unspecified asthma, uncomplicated: Secondary | ICD-10-CM | POA: Insufficient documentation

## 2019-12-10 MED ORDER — PROMETHAZINE-DM 6.25-15 MG/5ML PO SYRP: 5.0000 mL | ORAL_SOLUTION | Freq: Four times a day (QID) | ORAL | 0 refills | Status: DC | PRN

## 2019-12-10 MED ORDER — ONDANSETRON 4 MG PO TBDP: 4.0000 mg | ORAL_TABLET | Freq: Three times a day (TID) | ORAL | 0 refills | Status: DC | PRN

## 2019-12-10 NOTE — ED Triage Notes (Addendum)
Pt in with c/o vomiting, diarrhea, and dry cough that started today. Also c/o abdominal pain.  Kids were recently seen for URI  Denies runny nose, or sob

## 2019-12-10 NOTE — ED Provider Notes (Signed)
[facility]    CSN: [id number] Arrival date & time: 12/10/19  1910      History   Chief Complaint Chief Complaint  Patient presents with  . Diarrhea  . Emesis  . Headache  . Nasal Congestion    HPI Jaclyn Owens is a 24 y.o. female.   Patient presenting today with 1 day hx of N/V/D, fatigue, congestion, cough. She denies fever, chills, rashes, CP, SOB, intolerance to PO. So far not trying anything otc for sxs. Children recently sick with viral URIs similar to this. No known hx of asthma, allergies per patient.      Past Medical History:  Diagnosis Date  . Asthma   . HSV (herpes simplex virus) infection     Patient Active Problem List   Diagnosis Date Noted  . Indication for care in labor or delivery 02/16/2019  . Gestational diabetes 12/31/2018  . Short interval between pregnancies affecting pregnancy, antepartum 08/21/2018  . Supervision of other normal pregnancy, antepartum 08/29/2017  . 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 Adjustment disorder with depressed mood 02/24/2017    Past Surgical History:  Procedure Laterality Date  . ARM WOUND REPAIR / CLOSURE      OB History    Gravida  3   Para  3   Term  3   Preterm  0   AB  0   Living  3     SAB  0   TAB  0   Ectopic  0   Multiple  0   Live Births  3            Home Medications    Prior to Admission medications   Medication Sig Start Date End Date Taking? Authorizing Provider  Cetirizine HCl 10 MG CAPS Take 1 capsule (10 mg total) by mouth daily for 10 days. 12/03/19 12/13/19 Yes Wieters, Hallie C, PA-C  amoxicillin (AMOXIL) 500 MG capsule Take 2 capsules (1,000 mg total) by mouth 2 (two) times daily for 10 days. 12/03/19 12/13/19  Wieters, Hallie C, PA-C  etonogestrel (NEXPLANON) 68 MG IMPL implant 1 each by Subdermal route once.    [provider]  ondansetron (ZOFRAN ODT) 4 MG disintegrating tablet Take 1 tablet (4 mg total) by mouth every 8  (eight) hours as needed for nausea or vomiting. 12/10/19   Particia Nearing, PA-C  promethazine-dextromethorphan (PROMETHAZINE-DM) 6.25-15 MG/5ML syrup Take 5 mLs by mouth 4 (four) times daily as needed for cough. 12/10/19   Particia Nearing, PA-C    Family History Family History  Problem Relation Age of Onset  . Asthma Mother   . Diabetes Maternal Aunt   . Cancer Maternal Grandmother     Social History Social History   Tobacco Use  . Smoking status: Former Smoker    Types: Cigarettes, Cigars  . Smokeless tobacco: Never Used  Vaping Use  . Vaping Use: Never used  Substance Use Topics  . Alcohol use: Not Currently  . Drug use: No    Types: Marijuana    Comment: MJ use 01-24-15     Allergies   Bee venom   Review of Systems Review of Systems PER HPI   Physical Exam Triage Vital Signs ED Triage Vitals  Enc Vitals Group     BP 12/10/19 1941 (!) 114/99     Pulse Rate 12/10/19 1941 88     Resp --      Temp 12/10/19 1941 97.9 F (36.6 C)  Temp Source 12/10/19 1941 Oral     SpO2 12/10/19 1941 100 %     Weight --      Height --      Head Circumference --      Peak Flow --      Pain Score 12/10/19 1938 10     Pain Loc --      Pain Edu? --      Excl. in GC? --    No data found.  Updated Vital Signs BP (!) 114/99 (BP Location: Right Arm)   Pulse 88   Temp 97.9 F (36.6 C) (Oral)   LMP  (LMP Unknown)   SpO2 100%   Breastfeeding Yes   Visual Acuity Right Eye Distance:   Left Eye Distance:   Bilateral Distance:    Right Eye Near:   Left Eye Near:    Bilateral Near:     Physical Exam Vitals and nursing note reviewed.  Constitutional:      Appearance: Normal appearance. She is not ill-appearing.  HENT:     Head: Atraumatic.     Right Ear: Tympanic membrane normal.     Left Ear: Tympanic membrane normal.     Nose: Rhinorrhea present.     Mouth/Throat:     Mouth: Mucous membranes are moist.     Pharynx: Posterior oropharyngeal  erythema present.  Eyes:     Extraocular Movements: Extraocular movements intact.     Conjunctiva/sclera: Conjunctivae normal.  Cardiovascular:     Rate and Rhythm: Normal rate and regular rhythm.     Heart sounds: Normal heart sounds.  Pulmonary:     Effort: Pulmonary effort is normal.     Breath sounds: Normal breath sounds. No wheezing or rales.  Abdominal:     General: Bowel sounds are normal. There is no distension.     Palpations: Abdomen is soft.     Tenderness: There is no abdominal tenderness. There is no guarding.  Musculoskeletal:        General: Normal range of motion.     Cervical back: Normal range of motion and neck supple.  Skin:    General: Skin is warm and dry.  Neurological:     Mental Status: She is alert and oriented to person, place, and time.  Psychiatric:        Mood and Affect: Mood normal.        Thought Content: Thought content normal.        Judgment: Judgment normal.      UC Treatments / Results  Labs (all labs ordered are listed, but only abnormal results are displayed) Labs Reviewed  SARS CORONAVIRUS 2 (TAT 6-24 HRS)    EKG   Radiology No results found.  Procedures Procedures (including critical care time)  Medications Ordered in UC Medications - No data to display  Initial Impression / Assessment and Plan / UC Course  I have reviewed the triage vital signs and the nursing notes.  Pertinent labs & imaging results that were available during my care of the patient were reviewed by me and considered in my medical decision making (see chart for details).     Labs, vital signs reassuring today. Suspect viral illness. COVID pcr pending, isolation protocol reviewed. Will send zofran, phenergan DM for symptomatic relief and reviewed supportive home care. Return precautions given.   Final Clinical Impressions(s) / UC Diagnoses   Final diagnoses:  Viral URI  Nausea vomiting and diarrhea   Discharge Instructions   None  ED  Prescriptions    Medication Sig Dispense Auth. Provider   ondansetron (ZOFRAN ODT) 4 MG disintegrating tablet Take 1 tablet (4 mg total) by mouth every 8 (eight) hours as needed for nausea or vomiting. 20 tablet Particia Nearing, [place]   promethazine-dextromethorphan (PROMETHAZINE-DM) 6.25-15 MG/5ML syrup Take 5 mLs by mouth 4 (four) times daily as needed for cough. 100 mL Particia Nearing, [place]     PDMP not reviewed this encounter.   Particia Nearing, [place] 12/10/19 2011

## 2019-12-11 LAB — SARS CORONAVIRUS 2 (TAT 6-24 HRS): SARS Coronavirus 2: NEGATIVE

## 2020-02-16 ENCOUNTER — Encounter (HOSPITAL_COMMUNITY): Payer: Self-pay

## 2020-02-16 ENCOUNTER — Emergency Department (HOSPITAL_COMMUNITY)
Admission: EM | Admit: 2020-02-16 | Discharge: 2020-02-16 | Disposition: A | Discharge status: Home / Self Care | Payer: Medicaid Other | Attending: Emergency Medicine | Admitting: Emergency Medicine

## 2020-02-16 DIAGNOSIS — Z20822 Contact with and (suspected) exposure to covid-19: Secondary | ICD-10-CM | POA: Insufficient documentation

## 2020-02-16 DIAGNOSIS — R519 Headache, unspecified: Secondary | ICD-10-CM | POA: Insufficient documentation

## 2020-02-16 DIAGNOSIS — R509 Fever, unspecified: Secondary | ICD-10-CM | POA: Diagnosis not present

## 2020-02-16 DIAGNOSIS — Z5321 Procedure and treatment not carried out due to patient leaving prior to being seen by health care provider: Secondary | ICD-10-CM | POA: Insufficient documentation

## 2020-02-16 NOTE — ED Notes (Signed)
Pt states that she is not staying to be seen due to wait times, pt states she does not have anyone to watch her two small children and wanted to have a covid test and someone call her with the results

## 2020-02-16 NOTE — ED Triage Notes (Signed)
Pt states that she has had a covid exposure and had symptoms since Friday, headache, fevers, chills, pt is unvaccinated

## 2020-02-29 ENCOUNTER — Ambulatory Visit (HOSPITAL_COMMUNITY)
Admission: EM | Admit: 2020-02-29 | Discharge: 2020-02-29 | Disposition: A | Discharge status: Home / Self Care | Payer: Medicaid Other | Attending: Physician Assistant | Admitting: Physician Assistant

## 2020-02-29 ENCOUNTER — Other Ambulatory Visit: Payer: Self-pay

## 2020-02-29 ENCOUNTER — Encounter (HOSPITAL_COMMUNITY): Payer: Self-pay

## 2020-02-29 DIAGNOSIS — N939 Abnormal uterine and vaginal bleeding, unspecified: Secondary | ICD-10-CM | POA: Diagnosis not present

## 2020-02-29 DIAGNOSIS — Z975 Presence of (intrauterine) contraceptive device: Secondary | ICD-10-CM | POA: Insufficient documentation

## 2020-02-29 DIAGNOSIS — Z8616 Personal history of COVID-19: Secondary | ICD-10-CM | POA: Insufficient documentation

## 2020-02-29 DIAGNOSIS — R051 Acute cough: Secondary | ICD-10-CM | POA: Insufficient documentation

## 2020-02-29 DIAGNOSIS — Z87891 Personal history of nicotine dependence: Secondary | ICD-10-CM | POA: Diagnosis not present

## 2020-02-29 DIAGNOSIS — R5383 Other fatigue: Secondary | ICD-10-CM | POA: Diagnosis not present

## 2020-02-29 DIAGNOSIS — Z20822 Contact with and (suspected) exposure to covid-19: Secondary | ICD-10-CM | POA: Insufficient documentation

## 2020-02-29 DIAGNOSIS — Z283 Underimmunization status: Secondary | ICD-10-CM | POA: Insufficient documentation

## 2020-02-29 DIAGNOSIS — J069 Acute upper respiratory infection, unspecified: Secondary | ICD-10-CM | POA: Diagnosis not present

## 2020-02-29 LAB — CBC
HCT: 41.8 % (ref 36.0–46.0)
Hemoglobin: 13.9 g/dL (ref 12.0–15.0)
MCH: 29.3 pg (ref 26.0–34.0)
MCHC: 33.3 g/dL (ref 30.0–36.0)
MCV: 88 fL (ref 80.0–100.0)
Platelets: 314 10*3/uL (ref 150–400)
RBC: 4.75 MIL/uL (ref 3.87–5.11)
RDW: 12 % (ref 11.5–15.5)
WBC: 7.3 10*3/uL (ref 4.0–10.5)
nRBC: 0 % (ref 0.0–0.2)

## 2020-02-29 NOTE — ED Triage Notes (Addendum)
Pt in with c/o headache, cough and runny nose that has been going on for 3 days. Her kids tested positive on 1/18  Pt has not had medication for sxs

## 2020-02-29 NOTE — ED Provider Notes (Addendum)
[facility]    CSN: [id number] Arrival date & time: 02/29/20  1641      History   Chief Complaint Chief Complaint  Patient presents with  . Headache  . Nasal Congestion  . Cough    HPI Jaclyn Owens is a 25 y.o. female.   Pt presents with headache, cough, congestion that started 3 days ago.  Denies fever, chills, n/v/d/, shortness of breath, wheezing.  She also reports feeling more fatigued than normal.  Requesting COVID test.  Sons recently tested positive on 02/16/20.  She has not had her COVID vaccine.  Pt reports her LMP began 02/07/2020 and she has been bleeding since that time.  She reports continued heavy bleeding, soaking through three pads today so far.  She has nexplanon in place, inserted after her last delivery about 12 months ago.  She reports typically menstrual cycles are normal.  She does not have an OBGYN at this time, no PCP.  Pt here today with 22 month old and 24 year old.      Past Medical History:  Diagnosis Date  . Asthma   . HSV (herpes simplex virus) infection     Patient Active Problem List   Diagnosis Date Noted  . Indication for care in labor or delivery 02/16/2019  . Gestational diabetes 12/31/2018  . Short interval between pregnancies affecting pregnancy, antepartum 08/21/2018  . Supervision of other normal pregnancy, antepartum 08/29/2017  . 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 Adjustment disorder with depressed mood 02/24/2017    Past Surgical History:  Procedure Laterality Date  . ARM WOUND REPAIR / CLOSURE      OB History    Gravida  3   Para  3   Term  3   Preterm  0   AB  0   Living  3     SAB  0   IAB  0   Ectopic  0   Multiple  0   Live Births  3            Home Medications    Prior to Admission medications   Medication Sig Start Date End Date Taking? Authorizing Provider  Cetirizine HCl 10 MG CAPS Take 1 capsule (10 mg total) by mouth daily for 10 days. 12/03/19 12/13/19   Wieters, Hallie C, PA-C  etonogestrel (NEXPLANON) 68 MG IMPL implant 1 each by Subdermal route once.    [provider]  ondansetron (ZOFRAN ODT) 4 MG disintegrating tablet Take 1 tablet (4 mg total) by mouth every 8 (eight) hours as needed for nausea or vomiting. 12/10/19   Particia Nearing, PA-C  promethazine-dextromethorphan (PROMETHAZINE-DM) 6.25-15 MG/5ML syrup Take 5 mLs by mouth 4 (four) times daily as needed for cough. 12/10/19   Particia Nearing, PA-C    Family History Family History  Problem Relation Age of Onset  . Asthma Mother   . Diabetes Maternal Aunt   . Cancer Maternal Grandmother     Social History Social History   Tobacco Use  . Smoking status: Former Smoker    Types: Cigarettes, Cigars  . Smokeless tobacco: Never Used  Vaping Use  . Vaping Use: Never used  Substance Use Topics  . Alcohol use: Not Currently  . Drug use: No    Types: Marijuana    Comment: MJ use 01-24-15     Allergies   Bee venom   Review of Systems Review of Systems  Constitutional: Positive for fatigue. Negative for chills and  fever.  HENT: Positive for congestion. Negative for ear pain and sore throat.   Eyes: Negative for pain and visual disturbance.  Respiratory: Positive for cough. Negative for shortness of breath.   Cardiovascular: Negative for chest pain and palpitations.  Gastrointestinal: Negative for abdominal pain and vomiting.  Genitourinary: Negative for dysuria and hematuria.  Musculoskeletal: Negative for arthralgias and back pain.  Skin: Negative for color change and rash.  Neurological: Negative for seizures and syncope.  All other systems reviewed and are negative.    Physical Exam Triage Vital Signs ED Triage Vitals [02/29/20 1715]  Enc Vitals Group     BP      Pulse      Resp      Temp      Temp src      SpO2      Weight      Height      Head Circumference      Peak Flow      Pain Score 2     Pain Loc      Pain Edu?       Excl. in GC?    No data found.  Updated Vital Signs BP (!) 95/57   Pulse (!) 105   Temp 98 F (36.7 C)   Resp 17   SpO2 97%   Breastfeeding No    Wt Readings from Last 3 Encounters:  12/03/19 144 lb (65.3 kg)  02/17/19 175 lb (79.4 kg)  01/29/19 167 lb 11.2 oz (76.1 kg)   Temp Readings from Last 3 Encounters:  02/29/20 98 F (36.7 C)  02/16/20 98.3 F (36.8 C) (Oral)  12/10/19 97.9 F (36.6 C) (Oral)   BP Readings from Last 3 Encounters:  02/29/20 (!) 95/57  02/16/20 107/69  12/10/19 (!) 114/99   Pulse Readings from Last 3 Encounters:  02/29/20 (!) 105  02/16/20 91  12/10/19 88     Visual Acuity Right Eye Distance:   Left Eye Distance:   Bilateral Distance:    Right Eye Near:   Left Eye Near:    Bilateral Near:     Physical Exam Vitals and nursing note reviewed.  Constitutional:      General: She is not in acute distress.    Appearance: She is well-developed and well-nourished.  HENT:     Head: Normocephalic and atraumatic.  Eyes:     Conjunctiva/sclera: Conjunctivae normal.  Cardiovascular:     Rate and Rhythm: Normal rate and regular rhythm.     Heart sounds: No murmur heard.   Pulmonary:     Effort: Pulmonary effort is normal. No respiratory distress.     Breath sounds: Normal breath sounds.  Abdominal:     Palpations: Abdomen is soft.     Tenderness: There is no abdominal tenderness.  Musculoskeletal:        General: No edema.     Cervical back: Neck supple.  Skin:    General: Skin is warm and dry.  Neurological:     Mental Status: She is alert.  Psychiatric:        Mood and Affect: Mood and affect normal.      UC Treatments / Results  Labs (all labs ordered are listed, but only abnormal results are displayed) Labs Reviewed - No data to display  EKG   Radiology No results found.  Procedures Procedures (including critical care time)  Medications Ordered in UC Medications - No data to display  Initial Impression /  Assessment  and Plan / UC Course  I have reviewed the triage vital signs and the nursing notes.  Pertinent labs & imaging results that were available during my care of the patient were reviewed by me and considered in my medical decision making (see chart for details).     -Cough, congestion viral in nature, COVID test pending.  Self isolation instructions given.  Will treat sx at home symptomatically.  -Pt complains of abnormal menstrual bleeding.  BP 95/57, pulse 105 in clinic today.  She reports fatigue which she attributed to possibly having COVID with her concurrent cough and congestion.  She does not have a PCP or OBGYN.  Will order CBC here in clinic today.  Will call pt with results and advise if she needs to be seen in ED.  Pt understands and agrees with plan.  Strict return precautions given.   Addendum:  CBC normal.  COVID test pending. Attempted to call patient to discuss labs. Will try again. Final Clinical Impressions(s) / UC Diagnoses   Final diagnoses:  None   Discharge Instructions   None    ED Prescriptions    None     PDMP not reviewed this encounter.   Jodell Cipro, PA-C 02/29/20 1807    Jodell Cipro, PA-C 02/29/20 1813    Jodell Cipro, PA-C 02/29/20 1830    Jodell Cipro, PA-C 02/29/20 1956

## 2020-02-29 NOTE — Discharge Instructions (Addendum)
Will call with test results. COVID test pending.

## 2020-03-01 LAB — SARS CORONAVIRUS 2 (TAT 6-24 HRS): SARS Coronavirus 2: NEGATIVE

## 2020-07-01 ENCOUNTER — Other Ambulatory Visit: Payer: Self-pay

## 2020-07-01 ENCOUNTER — Ambulatory Visit (HOSPITAL_COMMUNITY)
Admission: EM | Admit: 2020-07-01 | Discharge: 2020-07-01 | Disposition: A | Discharge status: Home / Self Care | Payer: Medicaid Other

## 2020-07-01 NOTE — ED Triage Notes (Signed)
Pt presents for removal of BC implant in right arm. States causing headaches, prolonged bleeding, and discomfort in right arm.   Pt advised per A White, NP we do not removed implants. Pt is to follow-up with OBGYN or Health Dept.   Pt states no need to be evaluated if we can not removed the implant or start her on Birth Control.

## 2020-07-31 ENCOUNTER — Emergency Department (HOSPITAL_COMMUNITY)
Admission: EM | Admit: 2020-07-31 | Discharge: 2020-07-31 | Disposition: A | Discharge status: Home / Self Care | Payer: Medicaid Other | Attending: Emergency Medicine | Admitting: Emergency Medicine

## 2020-07-31 DIAGNOSIS — Z202 Contact with and (suspected) exposure to infections with a predominantly sexual mode of transmission: Secondary | ICD-10-CM | POA: Insufficient documentation

## 2020-07-31 DIAGNOSIS — Z113 Encounter for screening for infections with a predominantly sexual mode of transmission: Secondary | ICD-10-CM | POA: Diagnosis not present

## 2020-07-31 DIAGNOSIS — Z87891 Personal history of nicotine dependence: Secondary | ICD-10-CM | POA: Insufficient documentation

## 2020-07-31 DIAGNOSIS — J45909 Unspecified asthma, uncomplicated: Secondary | ICD-10-CM | POA: Diagnosis not present

## 2020-07-31 LAB — WET PREP, GENITAL
Sperm: NONE SEEN
Trich, Wet Prep: NONE SEEN
WBC, Wet Prep HPF POC: NONE SEEN
Yeast Wet Prep HPF POC: NONE SEEN

## 2020-07-31 LAB — I-STAT BETA HCG BLOOD, ED (MC, WL, AP ONLY): I-stat hCG, quantitative: <5 m[IU]/mL (ref <5)

## 2020-07-31 NOTE — ED Triage Notes (Signed)
Pt added that she denies STI symptoms, just wants to be screened

## 2020-07-31 NOTE — ED Provider Notes (Signed)
[facility] EMERGENCY DEPARTMENT Provider Note   CSN: [id number] Arrival date & time: 07/31/20  1957     History Chief Complaint  Patient presents with   Exposure to STD    Jaclyn Owens is a 25 y.o. female who presents to the ED today for STD screening.  Patient reports she is here for a checkup.  She denies any symptoms however wanted to have an STD screening done today.  She is sexually active with 1 partner.  Her 3 children are in the room with her at this time and she does not want to divulge too many more details however she denies any symptoms whatsoever.  Would like blood test for HIV and syphilis today.   The history is provided by the patient and medical records.      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   Adjustment disorder with depressed mood 02/24/2017    Past Surgical History:  Procedure Laterality Date   ARM WOUND REPAIR / CLOSURE       OB History     Gravida  3   Para  3   Term  3   Preterm  0   AB  0   Living  3      SAB  0   IAB  0   Ectopic  0   Multiple  0   Live Births  3           Family History  Problem Relation Age of Onset   Asthma Mother    Diabetes Maternal Aunt    Cancer Maternal Grandmother     Social History   Tobacco Use   Smoking status: Former    Pack years: 0.00    Types: Cigarettes, Cigars   Smokeless tobacco: Never  Vaping Use   Vaping Use: Never used  Substance Use Topics   Alcohol use: Not Currently   Drug use: No    Types: Marijuana    Comment: MJ use 01-24-15    Home  Medications Prior to Admission medications   Medication Sig Start Date End Date Taking? Authorizing Provider  Cetirizine HCl 10 MG CAPS Take 1 capsule (10 mg total) by mouth daily for 10 days. 12/03/19 12/13/19  Wieters, Hallie C, PA-C  etonogestrel (NEXPLANON) 68 MG IMPL implant 1 each by Subdermal route once.    [provider]  ondansetron (ZOFRAN ODT) 4 MG disintegrating tablet Take 1 tablet (4 mg total) by mouth every 8 (eight) hours as needed for nausea or vomiting. 12/10/19   Particia Nearing, PA-C  promethazine-dextromethorphan (PROMETHAZINE-DM) 6.25-15 MG/5ML syrup Take 5 mLs by mouth 4 (four) times daily as needed for cough. 12/10/19   Particia Nearing, PA-C    Allergies    Bee venom  Review of Systems   Review of Systems  Gastrointestinal:  Negative for abdominal pain, nausea and vomiting.  Genitourinary:  Negative for vaginal discharge.  All other systems reviewed and are negative.  Physical Exam Updated Vital Signs BP 94/82 (BP Location: Left Arm)   Pulse 98   Temp 98.7 F (37.1 C)  Resp 18   SpO2 95%   Physical Exam Vitals and nursing note reviewed.  Constitutional:      Appearance: She is not ill-appearing.  HENT:     Head: Normocephalic and atraumatic.  Eyes:     Conjunctiva/sclera: Conjunctivae normal.  Cardiovascular:     Rate and Rhythm: Normal rate and regular rhythm.  Pulmonary:     Effort: Pulmonary effort is normal.     Breath sounds: Normal breath sounds. No wheezing, rhonchi or rales.  Abdominal:     Palpations: Abdomen is soft.     Tenderness: There is no abdominal tenderness. There is no guarding or rebound.  Genitourinary:    Comments: Deferred by patient Musculoskeletal:     Cervical back: Neck supple.  Skin:    General: Skin is warm and dry.     Coloration: Skin is not jaundiced.  Neurological:     Mental Status: She is alert.    ED Results / Procedures / Treatments   Labs (all labs ordered are listed, but only  abnormal results are displayed) Labs Reviewed  WET PREP, GENITAL - Abnormal; Notable for the following components:      Result Value   Clue Cells Wet Prep HPF POC PRESENT (*)    All other components within normal limits  RPR  HIV ANTIBODY (ROUTINE TESTING W REFLEX)  I-STAT BETA HCG BLOOD, ED ([facility], [facility], [facility] ONLY)  GC/CHLAMYDIA PROBE AMP ([place]) NOT AT [facility]    EKG None  Radiology No results found.  Procedures Procedures   Medications Ordered in ED Medications - No data to display  ED Course  I have reviewed the triage vital signs and the nursing notes.  Pertinent labs & imaging results that were available during my care of the patient were reviewed by me and considered in my medical decision making (see chart for details).    MDM Rules/Calculators/A&P                          25 year old female who presents to the ED today for STD screen.  Asymptomatic.  Sexually active with 1 partner.  On arrival to the ED today vitals are stable and patient appears to be in no acute distress.  She is here with her 3 children.  She is comfortable self swabbing today.  I do feel this is appropriate given she is asymptomatic.  She would like HIV and syphilis testing as well.  We will oblige.  Will discharge afterwards.   Wet prep positive for clue cells however pt without symptoms. Do not feel she needs treatment at this time.  Beta hcg negative  Discharged at this time.   This note was prepared using Dragon voice recognition software and may include unintentional dictation errors due to the inherent limitations of voice recognition software.  Final Clinical Impression(s) / ED Diagnoses Final diagnoses:  Screen for STD (sexually transmitted disease)    Rx / DC Orders ED Discharge Orders     None        Tanda Rockers, PA-C 07/31/20 2328    Wynetta Fines, MD 08/01/20 1836

## 2020-07-31 NOTE — Discharge Instructions (Signed)
We have tested you for gonorrhea, chlamydia, HIV, and syphilis. We will call you if any of your results return positive. You can also log into MyChart and check the results that way.   Please follow up with your PCP regarding your ED visit today. If you do not have one you can follow up with [facility] for primary care needs.   Return to the ED for any new/worsening symptoms

## 2020-07-31 NOTE — ED Triage Notes (Signed)
Pt c/o possible STD exposure last night.

## 2020-08-01 LAB — RPR: RPR Ser Ql: NONREACTIVE

## 2020-08-01 LAB — HIV ANTIBODY (ROUTINE TESTING W REFLEX): HIV Screen 4th Generation wRfx: NONREACTIVE

## 2020-08-02 ENCOUNTER — Telehealth: Payer: Self-pay | Admitting: *Deleted

## 2020-08-02 LAB — GC/CHLAMYDIA PROBE AMP (~~LOC~~) NOT AT ARMC
Chlamydia: NEGATIVE
Comment: NEGATIVE
Comment: NORMAL
Neisseria Gonorrhea: NEGATIVE

## 2020-08-02 NOTE — Telephone Encounter (Signed)
Transition Care Management Follow-up Telephone Call Date of discharge and from where: 07/31/2020 - [facility] How have you been since you were released from the hospital? "I am fine" Any questions or concerns? No  Items Reviewed: Did the pt receive and understand the discharge instructions provided? Yes  Medications obtained and verified?  N/A Other? No  Any new allergies since your discharge? No  Dietary orders reviewed? No Do you have support at home? No    Functional Questionnaire: (I = Independent and D = Dependent) ADLs: I  Bathing/Dressing- I  Meal Prep- I  Eating- I  Maintaining continence- I  Transferring/Ambulation- I  Managing Meds- I  Follow up appointments reviewed:  PCP Hospital f/u appt confirmed? No   Specialist Hospital f/u appt confirmed? No   Are transportation arrangements needed? No  If their condition worsens, is the pt aware to call PCP or go to the Emergency Dept.? Yes Was the patient provided with contact information for the PCP's office or ED? Yes Was to pt encouraged to call back with questions or concerns? Yes

## 2020-08-17 ENCOUNTER — Ambulatory Visit: Payer: Medicaid Other | Admitting: Family Medicine

## 2020-10-28 ENCOUNTER — Other Ambulatory Visit: Payer: Self-pay

## 2020-10-28 ENCOUNTER — Ambulatory Visit
Admission: EM | Admit: 2020-10-28 | Discharge: 2020-10-28 | Disposition: A | Discharge status: Home / Self Care | Payer: Medicaid Other | Attending: Urgent Care | Admitting: Urgent Care

## 2020-10-28 DIAGNOSIS — Z113 Encounter for screening for infections with a predominantly sexual mode of transmission: Secondary | ICD-10-CM | POA: Insufficient documentation

## 2020-10-28 NOTE — ED Triage Notes (Signed)
Pt requesting STD testing due to having a new partner. Denies vaginal discharge, odor and itching. Denies urinary frequency and urgency.

## 2020-10-28 NOTE — ED Provider Notes (Signed)
[facility]   MRN: [id number] DOB: 05-23-1995  Subjective:   Jaclyn Owens is a 25 y.o. female presenting for STI screening. Patient has a new sex partner and would like to have screening done. No known exposures, just has a new partner and wants to be checked. Denies fever, n/v, abdominal pain, pelvic pain, rashes, dysuria, urinary frequency, hematuria, vaginal discharge.    No current facility-administered medications for this encounter.  Current Outpatient Medications:    Cetirizine HCl 10 MG CAPS, Take 1 capsule (10 mg total) by mouth daily for 10 days., Disp: 10 capsule, Rfl: 0   etonogestrel (NEXPLANON) 68 MG IMPL implant, 1 each by Subdermal route once., Disp: , Rfl:    ondansetron (ZOFRAN ODT) 4 MG disintegrating tablet, Take 1 tablet (4 mg total) by mouth every 8 (eight) hours as needed for nausea or vomiting., Disp: 20 tablet, Rfl: 0   promethazine-dextromethorphan (PROMETHAZINE-DM) 6.25-15 MG/5ML syrup, Take 5 mLs by mouth 4 (four) times daily as needed for cough., Disp: 100 mL, Rfl: 0   Allergies  Allergen Reactions   Bee Venom Anaphylaxis    Past Medical History:  Diagnosis Date   Asthma    HSV (herpes simplex virus) infection      Past Surgical History:  Procedure Laterality Date   ARM WOUND REPAIR / CLOSURE      Family History  Problem Relation Age of Onset   Asthma Mother    Diabetes Maternal Aunt    Cancer Maternal Grandmother     Social History   Tobacco Use   Smoking status: Former    Types: Cigarettes, Cigars   Smokeless tobacco: Never  Vaping Use   Vaping Use: Never used  Substance Use Topics   Alcohol use: Not Currently   Drug use: No    Types: Marijuana    Comment: MJ use 01-24-15    ROS   Objective:   Vitals: BP 115/80 (BP Location: Left Arm)   Pulse 94   Temp 98.3 F (36.8 C) (Oral)   Resp 18   SpO2 98%   Physical Exam Constitutional:      General: She is not in acute distress.    Appearance: Normal  appearance. She is well-developed. She is not ill-appearing, toxic-appearing or diaphoretic.  HENT:     Head: Normocephalic and atraumatic.     Nose: Nose normal.     Mouth/Throat:     Mouth: Mucous membranes are moist.     Pharynx: Oropharynx is clear.  Eyes:     General: No scleral icterus.       Right eye: No discharge.        Left eye: No discharge.     Extraocular Movements: Extraocular movements intact.     Conjunctiva/sclera: Conjunctivae normal.     Pupils: Pupils are equal, round, and reactive to light.  Cardiovascular:     Rate and Rhythm: Normal rate.  Pulmonary:     Effort: Pulmonary effort is normal.  Skin:    General: Skin is warm and dry.  Neurological:     General: No focal deficit present.     Mental Status: She is alert and oriented to person, place, and time.  Psychiatric:        Mood and Affect: Mood normal.        Behavior: Behavior normal.        Thought Content: Thought content normal.        Judgment: Judgment normal.    Assessment and Plan :  PDMP not reviewed this encounter.  1. Screen for STD (sexually transmitted disease)     Screening labs pending. Will treat as appropriate.    Wallis Bamberg, [place] 10/28/20 6712

## 2020-10-29 LAB — RPR: RPR Ser Ql: NONREACTIVE

## 2020-10-29 LAB — HIV ANTIBODY (ROUTINE TESTING W REFLEX): HIV Screen 4th Generation wRfx: NONREACTIVE

## 2020-10-31 LAB — CERVICOVAGINAL ANCILLARY ONLY
Chlamydia: NEGATIVE
Comment: NEGATIVE
Comment: NEGATIVE
Comment: NORMAL
Neisseria Gonorrhea: POSITIVE — AB
Trichomonas: POSITIVE — AB

## 2020-11-01 ENCOUNTER — Telehealth (HOSPITAL_COMMUNITY): Payer: Self-pay | Admitting: Emergency Medicine

## 2020-11-01 MED ORDER — METRONIDAZOLE 500 MG PO TABS: 500.0000 mg | ORAL_TABLET | Freq: Two times a day (BID) | ORAL | 0 refills | Status: DC

## 2020-11-02 ENCOUNTER — Ambulatory Visit
Admission: EM | Admit: 2020-11-02 | Discharge: 2020-11-02 | Disposition: A | Discharge status: Home / Self Care | Payer: Medicaid Other | Attending: Urgent Care | Admitting: Urgent Care

## 2020-11-02 ENCOUNTER — Other Ambulatory Visit: Payer: Self-pay

## 2020-11-02 DIAGNOSIS — A549 Gonococcal infection, unspecified: Secondary | ICD-10-CM | POA: Diagnosis not present

## 2020-11-02 MED ORDER — CEFTRIAXONE SODIUM 500 MG IJ SOLR
500.0000 mg | Freq: Once | INTRAMUSCULAR | Status: AC
  Administered 2020-11-02: 500 mg via INTRAMUSCULAR

## 2020-11-02 NOTE — ED Triage Notes (Signed)
Rn visit for sti tx see mar. Administered without complications. Education provided.

## 2020-11-02 NOTE — ED Provider Notes (Signed)
Nurse visit for gonorrhea treatment. IM ceftriaxone order placed.    Wallis Bamberg, PA-C 11/02/20 0045

## 2020-12-16 ENCOUNTER — Ambulatory Visit
Admission: EM | Admit: 2020-12-16 | Discharge: 2020-12-16 | Disposition: A | Discharge status: Home / Self Care | Payer: Medicaid Other | Attending: Physician Assistant | Admitting: Physician Assistant

## 2020-12-16 ENCOUNTER — Encounter: Payer: Self-pay | Admitting: Emergency Medicine

## 2020-12-16 ENCOUNTER — Other Ambulatory Visit: Payer: Self-pay

## 2020-12-16 DIAGNOSIS — J111 Influenza due to unidentified influenza virus with other respiratory manifestations: Secondary | ICD-10-CM | POA: Insufficient documentation

## 2020-12-16 NOTE — ED Triage Notes (Signed)
Cough, vomiting, fever, x 2 days. Eye pain, headache. Also requesting follow up for STD testing.

## 2020-12-16 NOTE — ED Provider Notes (Signed)
[facility]    CSN: [id number] Arrival date & time: 12/16/20  0855      History   Chief Complaint Chief Complaint  Patient presents with   Cough   Fever   Emesis    HPI Jaclyn Owens is a 25 y.o. female.   Pt here with 3 children who have viral illness.  Child positive for influenza A.  Pt request recheck for chlamydia   The history is provided by the patient. No language interpreter was used.  Cough Cough characteristics:  Non-productive Sputum characteristics:  Nondescript Severity:  Moderate Onset quality:  Gradual Duration:  4 days Timing:  Constant Progression:  Worsening Chronicity:  New Smoker: no   Relieved by:  Nothing Worsened by:  Nothing Ineffective treatments:  None tried Associated symptoms: fever   Risk factors: recent infection   Fever Associated symptoms: cough and vomiting   Emesis Associated symptoms: cough and fever    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 Authorizing Provider  Cetirizine HCl 10 MG CAPS Take 1 capsule (10 mg total) by mouth daily for 10 days. 12/03/19 12/13/19  Wieters, Hallie C, PA-C  etonogestrel  (NEXPLANON) 68 MG IMPL implant 1 each by Subdermal route once.    [provider]  metroNIDAZOLE (FLAGYL) 500 MG tablet Take 1 tablet (500 mg total) by mouth 2 (two) times daily. 11/01/20   Lamptey, Britta Mccreedy, MD  ondansetron (ZOFRAN ODT) 4 MG disintegrating tablet Take 1 tablet (4 mg total) by mouth every 8 (eight) hours as needed for nausea or vomiting. 12/10/19   Particia Nearing, PA-C  promethazine-dextromethorphan (PROMETHAZINE-DM) 6.25-15 MG/5ML syrup Take 5 mLs by mouth 4 (four) times daily as needed for cough. 12/10/19   Particia Nearing, PA-C    Family History Family History  Problem Relation Age of Onset   Asthma Mother    Diabetes Maternal Aunt    Cancer Maternal Grandmother     Social History Social History   Tobacco Use   Smoking status: Former    Types: Cigarettes, Cigars   Smokeless tobacco: Never  Vaping Use   Vaping Use: Never used  Substance Use Topics   Alcohol use: Not Currently   Drug use: No    Types: Marijuana    Comment: MJ use 01-24-15     Allergies   Bee venom   Review of Systems Review of Systems  Constitutional:  Positive for fever.  Respiratory:  Positive for cough.   Gastrointestinal:  Positive for vomiting.  All other systems reviewed and are negative.   Physical Exam Triage Vital Signs ED Triage Vitals [12/16/20 0939]  Enc Vitals Group     BP 100/69     Pulse Rate 87     Resp 20     Temp 98.1 F (36.7 C)     Temp Source Oral     SpO2 98 %     Weight      Height      Head Circumference      Peak Flow      Pain Score      Pain Loc      Pain Edu?      Excl. in GC?    No data found.  Updated Vital Signs BP 100/69 (BP Location: Left Arm)   Pulse 87   Temp 98.1 F (36.7 C) (Oral)   Resp 20   SpO2 98%   Visual Acuity Right Eye Distance:   Left Eye Distance:   Bilateral Distance:    Right Eye Near:   Left Eye Near:    Bilateral Near:     Physical Exam Vitals and nursing note reviewed.   Constitutional:      Appearance: She is well-developed.  HENT:     Head: Normocephalic.  Cardiovascular:     Rate and Rhythm: Normal rate.  Pulmonary:     Effort: Pulmonary effort is normal.  Abdominal:     General: There is no distension.  Musculoskeletal:        General: Normal range of motion.     Cervical back: Normal range of motion.  Neurological:     Mental Status: She is alert and oriented to person, place, and time.     UC Treatments / Results  Labs (all labs ordered are listed, but only abnormal results are displayed) Labs Reviewed  CERVICOVAGINAL ANCILLARY ONLY    EKG   Radiology No results found.  Procedures Procedures (including critical care time)  Medications Ordered in UC Medications - No data to display  Initial Impression / Assessment and Plan / UC Course  I have reviewed the triage vital signs and the nursing notes.  Pertinent labs & imaging results that were available during my care of the patient were reviewed by me and considered in my medical decision making (see chart for details).     GC and Ct pending   Final Clinical Impressions(s) / UC Diagnoses   Final diagnoses:  Influenza with respiratory manifestation   Discharge Instructions   None    ED Prescriptions   None    PDMP not reviewed this encounter. An After Visit Summary was printed and given to the patient.    Elson Areas, [place] 12/16/20 1035

## 2020-12-19 ENCOUNTER — Other Ambulatory Visit: Payer: Self-pay

## 2020-12-19 ENCOUNTER — Ambulatory Visit
Admission: EM | Admit: 2020-12-19 | Discharge: 2020-12-19 | Disposition: A | Discharge status: Home / Self Care | Payer: Medicaid Other | Attending: Physician Assistant | Admitting: Physician Assistant

## 2020-12-19 DIAGNOSIS — J101 Influenza due to other identified influenza virus with other respiratory manifestations: Secondary | ICD-10-CM

## 2020-12-19 DIAGNOSIS — B9689 Other specified bacterial agents as the cause of diseases classified elsewhere: Secondary | ICD-10-CM | POA: Diagnosis not present

## 2020-12-19 DIAGNOSIS — N760 Acute vaginitis: Secondary | ICD-10-CM

## 2020-12-19 LAB — CERVICOVAGINAL ANCILLARY ONLY
Bacterial Vaginitis (gardnerella): POSITIVE — AB
Chlamydia: NEGATIVE
Comment: NEGATIVE
Comment: NEGATIVE
Comment: NEGATIVE
Comment: NORMAL
Neisseria Gonorrhea: NEGATIVE
Trichomonas: NEGATIVE

## 2020-12-19 MED ORDER — METRONIDAZOLE 500 MG PO TABS: 500.0000 mg | ORAL_TABLET | Freq: Two times a day (BID) | ORAL | 0 refills | Status: DC

## 2020-12-19 MED ORDER — OSELTAMIVIR PHOSPHATE 75 MG PO CAPS: 75.0000 mg | ORAL_CAPSULE | Freq: Two times a day (BID) | ORAL | 0 refills | Status: DC

## 2020-12-19 NOTE — ED Provider Notes (Signed)
[facility]    CSN: [id number] Arrival date & time: 12/19/20  1227      History   Chief Complaint Chief Complaint  Patient presents with   Cough    HPI Roxie Kreeger is a 25 y.o. female.   Patient here today for evaluation continued cough and congestion that started 4 days ago. Son was diagnosed with flu 3 days ago. She has tried tylenol and ibuprofen with mild relief.  She also had STD screening 3 days ago and would like results if possible.   The history is provided by the patient.  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 Authorizing Provider  metroNIDAZOLE (FLAGYL) 500 MG tablet Take 1 tablet (500 mg total) by mouth 2 (two) times daily. 12/19/20  Yes Tomi Bamberger, PA-C  oseltamivir (TAMIFLU) 75 MG capsule Take 1 capsule (75 mg total) by mouth every 12 (twelve) hours. 12/19/20  Yes Tomi Bamberger, PA-C  Cetirizine HCl 10 MG CAPS Take 1 capsule (10 mg total) by mouth daily for 10 days. 12/03/19 12/13/19  Wieters, Hallie C, PA-C  etonogestrel (NEXPLANON) 68  MG IMPL implant 1 each by Subdermal route once.    [provider]  ondansetron (ZOFRAN ODT) 4 MG disintegrating tablet Take 1 tablet (4 mg total) by mouth every 8 (eight) hours as needed for nausea or vomiting. 12/10/19   Particia Nearing, PA-C    Family History Family History  Problem Relation Age of Onset   Asthma Mother    Diabetes Maternal Aunt    Cancer Maternal Grandmother     Social History Social History   Tobacco Use   Smoking status: Former    Types: Cigarettes, Cigars   Smokeless tobacco: Never  Vaping Use   Vaping Use: Never used  Substance Use Topics   Alcohol use: Not Currently   Drug use: No    Types: Marijuana    Comment: MJ use 01-24-15     Allergies   Bee venom   Review of Systems Review of Systems  Constitutional:  Negative for fever.  HENT:  Positive for congestion and rhinorrhea.   Eyes:  Negative for discharge and redness.  Respiratory:  Positive for cough.   Gastrointestinal:  Negative for diarrhea and vomiting.    Physical Exam Triage Vital Signs ED  Triage Vitals [12/19/20 1615]  Enc Vitals Group     BP 113/78     Pulse Rate 88     Resp 18     Temp 97.7 F (36.5 C)     Temp Source Oral     SpO2 98 %     Weight      Height      Head Circumference      Peak Flow      Pain Score 7     Pain Loc      Pain Edu?      Excl. in GC?    No data found.  Updated Vital Signs BP 113/78 (BP Location: Left Arm)   Pulse 88   Temp 97.7 F (36.5 C) (Oral)   Resp 18   SpO2 98%   Breastfeeding No      Physical Exam Vitals and nursing note reviewed.  Constitutional:      General: She is not in acute distress.    Appearance: Normal appearance. She is not ill-appearing.  HENT:     Head: Normocephalic and atraumatic.     Right Ear: Tympanic membrane normal.     Left Ear: Tympanic membrane normal.     Nose: Congestion present.     Mouth/Throat:     Mouth: Mucous membranes are moist.     Pharynx: No oropharyngeal  exudate or posterior oropharyngeal erythema.  Eyes:     Conjunctiva/sclera: Conjunctivae normal.  Cardiovascular:     Rate and Rhythm: Normal rate and regular rhythm.     Heart sounds: Normal heart sounds. No murmur heard. Pulmonary:     Effort: Pulmonary effort is normal. No respiratory distress.     Breath sounds: Normal breath sounds. No wheezing, rhonchi or rales.  Skin:    General: Skin is warm and dry.  Neurological:     Mental Status: She is alert.  Psychiatric:        Mood and Affect: Mood normal.        Thought Content: Thought content normal.     UC Treatments / Results  Labs (all labs ordered are listed, but only abnormal results are displayed) Labs Reviewed - No data to display  EKG   Radiology No results found.  Procedures Procedures (including critical care time)  Medications Ordered in UC Medications - No data to display  Initial Impression / Assessment and Plan / UC Course  I have reviewed the triage vital signs and the nursing notes.  Pertinent labs & imaging results that were available during my care of the patient were reviewed by me and considered in my medical decision making (see chart for details).   Tamiflu offered for treatment of influenza. Discussed symptomatic treatment otherwise. Will treat with metronidazole to cover BV, and encouraged follow up with any further concerns.   Final Clinical Impressions(s) / UC Diagnoses   Final diagnoses:  Influenza A  BV (bacterial vaginosis)   Discharge Instructions   None    ED Prescriptions     Medication Sig Dispense Auth. Provider   oseltamivir (TAMIFLU) 75 MG capsule Take 1 capsule (75 mg total) by mouth every 12 (twelve) hours. 10 capsule Tomi Bamberger, PA-C   metroNIDAZOLE (FLAGYL) 500 MG tablet Take 1 tablet (500 mg total) by mouth 2 (two) times daily. 14 tablet Tomi Bamberger, PA-C      PDMP not reviewed this encounter.   Tomi Bamberger, PA-C 12/19/20 1739

## 2020-12-19 NOTE — ED Triage Notes (Signed)
Pt stats seen on Friday and having same sx's. C/o cough, runny nose, fever, and headache x4 days.

## 2021-01-16 ENCOUNTER — Ambulatory Visit
Admission: EM | Admit: 2021-01-16 | Discharge: 2021-01-16 | Disposition: A | Discharge status: Home / Self Care | Payer: Medicaid Other | Attending: Physician Assistant | Admitting: Physician Assistant

## 2021-01-16 ENCOUNTER — Other Ambulatory Visit: Payer: Self-pay

## 2021-01-16 DIAGNOSIS — Z113 Encounter for screening for infections with a predominantly sexual mode of transmission: Secondary | ICD-10-CM | POA: Diagnosis not present

## 2021-01-16 DIAGNOSIS — J069 Acute upper respiratory infection, unspecified: Secondary | ICD-10-CM | POA: Diagnosis not present

## 2021-01-16 LAB — POCT URINALYSIS DIP (MANUAL ENTRY)
Bilirubin, UA: NEGATIVE
Blood, UA: NEGATIVE
Glucose, UA: NEGATIVE mg/dL
Ketones, POC UA: NEGATIVE mg/dL
Nitrite, UA: NEGATIVE
Protein Ur, POC: NEGATIVE mg/dL
Spec Grav, UA: 1.025 (ref 1.010–1.025)
Urobilinogen, UA: 1 E.U./dL
pH, UA: 7 (ref 5.0–8.0)

## 2021-01-16 LAB — POCT URINE PREGNANCY: Preg Test, Ur: NEGATIVE

## 2021-01-16 NOTE — ED Triage Notes (Signed)
Pt c/o sore throat, nasal congestion with drainage, cough.   Denies headache, ear ache, nausea, vomiting, diarrhea, constipation.   Onset x1  day   ------------  Pt c/o polyuria.  Denies hematuria, discharge, lower pelvic pain, lower back pain, oliguria, vaginal odors.   Concerned that sex partner may have additional sex partners.   -----------------  Pt also asking for resources to remove nexplanon implant

## 2021-01-16 NOTE — ED Provider Notes (Signed)
[facility]    CSN: [id number] Arrival date & time: 01/16/21  0347      History   Chief Complaint Chief Complaint  Patient presents with   Cough    HPI Jaclyn Owens is a 25 y.o. female.   Patient here today for evaluation of nasal congestion and drainage, cough, sore throat that started yesterday.  She has tried over-the-counter medication without significant relief.  She denies fever.  She also reports urinary frequency.  She would like STD screening.  The history is provided by the patient.   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 Authorizing Provider  Cetirizine HCl 10 MG CAPS Take 1 capsule (10 mg total) by mouth daily for 10 days. 12/03/19 12/13/19  Wieters, Hallie C, PA-C  etonogestrel (NEXPLANON) 68 MG IMPL implant 1 each by Subdermal route once.    [provider]  metroNIDAZOLE (FLAGYL) 500 MG tablet Take 1 tablet (500 mg total) by mouth 2 (two) times daily. 12/19/20   Tomi Bamberger, PA-C  ondansetron (ZOFRAN ODT) 4 MG disintegrating tablet Take 1 tablet (4 mg total) by mouth every 8 (eight) hours as needed for nausea or vomiting.  12/10/19   Particia Nearing, PA-C  oseltamivir (TAMIFLU) 75 MG capsule Take 1 capsule (75 mg total) by mouth every 12 (twelve) hours. 12/19/20   Tomi Bamberger, PA-C    Family History Family History  Problem Relation Age of Onset   Asthma Mother    Diabetes Maternal Aunt    Cancer Maternal Grandmother     Social History Social History   Tobacco Use   Smoking status: Former    Types: Cigarettes, Cigars   Smokeless tobacco: Never  Vaping Use   Vaping Use: Never used  Substance Use Topics   Alcohol use: Not Currently   Drug use: No    Types: Marijuana    Comment: MJ use 01-24-15     Allergies   Bee venom   Review of Systems Review of Systems  Constitutional:  Negative for chills and fever.  HENT:  Positive for congestion and sore throat. Negative for ear pain.   Eyes:  Negative for discharge and redness.  Respiratory:  Positive for cough. Negative for shortness of breath and wheezing.   Gastrointestinal:  Negative for abdominal pain, diarrhea, nausea and vomiting.  Genitourinary:  Positive for frequency. Negative for dysuria.    Physical Exam  Triage Vital Signs ED Triage Vitals  Enc Vitals Group     BP      Pulse      Resp      Temp      Temp src      SpO2      Weight      Height      Head Circumference      Peak Flow      Pain Score      Pain Loc      Pain Edu?      Excl. in GC?    No data found.  Updated Vital Signs BP 116/81 (BP Location: Left Arm)    Pulse 90    Temp 98.6 F (37 C) (Oral)    Resp 18    SpO2 98%    Breastfeeding No   Physical Exam Vitals and nursing note reviewed.  Constitutional:      General: She is not in acute distress.    Appearance: Normal appearance. She is not ill-appearing.  HENT:     Head: Normocephalic and atraumatic.     Right Ear: Tympanic membrane normal.     Left Ear: Tympanic membrane normal.     Nose: Congestion present.     Mouth/Throat:     Mouth: Mucous membranes are moist.     Pharynx: No  oropharyngeal exudate or posterior oropharyngeal erythema.  Eyes:     Conjunctiva/sclera: Conjunctivae normal.  Cardiovascular:     Rate and Rhythm: Normal rate and regular rhythm.     Heart sounds: Normal heart sounds. No murmur heard. Pulmonary:     Effort: Pulmonary effort is normal. No respiratory distress.     Breath sounds: Normal breath sounds. No wheezing, rhonchi or rales.  Skin:    General: Skin is warm and dry.  Neurological:     Mental Status: She is alert.  Psychiatric:        Mood and Affect: Mood normal.        Thought Content: Thought content normal.     UC Treatments / Results  Labs (all labs ordered are listed, but only abnormal results are displayed) Labs Reviewed  POCT URINALYSIS DIP (MANUAL ENTRY) - Abnormal; Notable for the following components:      Result Value   Leukocytes, UA Trace (*)    All other components within normal limits  COVID-19, FLU A+B NAA  HIV ANTIBODY (ROUTINE TESTING W REFLEX)  RPR  HEPATITIS PANEL, ACUTE  POCT URINE PREGNANCY  CERVICOVAGINAL ANCILLARY ONLY    EKG   Radiology No results found.  Procedures Procedures (including critical care time)  Medications Ordered in UC Medications - No data to display  Initial Impression / Assessment and Plan / UC Course  I have reviewed the triage vital signs and the nursing notes.  Pertinent labs & imaging results that were available during my care of the patient were reviewed by me and considered in my medical decision making (see chart for details).    UA without sign of UTI.  Urine pregnancy test negative.  STD screening ordered as requested.  Recommended OB/GYN for follow-up of Nexplanon removal, further contraception if wanted.  Contact information provided for same.  Upper respiratory symptoms seem viral in nature, will order COVID and flu screening for same and recommend symptomatic treatment.  Encouraged follow-up with any further concerns.  Final Clinical Impressions(s) /  UC Diagnoses   Final diagnoses:  Screening examination for STD (sexually transmitted disease)  Acute upper respiratory infection   Discharge Instructions   None    ED Prescriptions   None    PDMP not reviewed this encounter.   Tomi Bamberger, PA-C 01/16/21 1258

## 2021-01-17 LAB — CERVICOVAGINAL ANCILLARY ONLY
Bacterial Vaginitis (gardnerella): POSITIVE — AB
Candida Glabrata: NEGATIVE
Candida Vaginitis: NEGATIVE
Chlamydia: NEGATIVE
Comment: NEGATIVE
Comment: NEGATIVE
Comment: NEGATIVE
Comment: NEGATIVE
Comment: NEGATIVE
Comment: NORMAL
Neisseria Gonorrhea: NEGATIVE
Trichomonas: NEGATIVE

## 2021-01-17 LAB — HIV ANTIBODY (ROUTINE TESTING W REFLEX): HIV Screen 4th Generation wRfx: NONREACTIVE

## 2021-01-17 LAB — RPR: RPR Ser Ql: NONREACTIVE

## 2021-01-18 ENCOUNTER — Telehealth: Payer: Self-pay

## 2021-01-18 LAB — COVID-19, FLU A+B NAA
Influenza A, NAA: NOT DETECTED
Influenza B, NAA: NOT DETECTED
SARS-CoV-2, NAA: NOT DETECTED

## 2021-01-18 MED ORDER — METRONIDAZOLE 500 MG PO TABS
500.0000 mg | ORAL_TABLET | Freq: Two times a day (BID) | ORAL | 0 refills | Status: DC
Start: 2021-01-18 — End: 2021-02-21

## 2021-02-20 ENCOUNTER — Ambulatory Visit
Admission: EM | Admit: 2021-02-20 | Discharge: 2021-02-20 | Disposition: A | Discharge status: Home / Self Care | Payer: Medicaid Other | Attending: Internal Medicine | Admitting: Internal Medicine

## 2021-02-20 ENCOUNTER — Other Ambulatory Visit: Payer: Self-pay

## 2021-02-20 ENCOUNTER — Encounter: Payer: Self-pay | Admitting: Emergency Medicine

## 2021-02-20 DIAGNOSIS — Z3202 Encounter for pregnancy test, result negative: Secondary | ICD-10-CM | POA: Diagnosis not present

## 2021-02-20 DIAGNOSIS — N939 Abnormal uterine and vaginal bleeding, unspecified: Secondary | ICD-10-CM | POA: Diagnosis not present

## 2021-02-20 DIAGNOSIS — Z113 Encounter for screening for infections with a predominantly sexual mode of transmission: Secondary | ICD-10-CM | POA: Diagnosis not present

## 2021-02-20 LAB — POCT URINE PREGNANCY: Preg Test, Ur: NEGATIVE

## 2021-02-20 MED ORDER — MEGESTROL ACETATE 40 MG PO TABS: 40.0000 mg | ORAL_TABLET | Freq: Every day | ORAL | 0 refills | Status: AC

## 2021-02-20 NOTE — ED Provider Notes (Signed)
[facility]    CSN: [id number] Arrival date & time: 02/20/21  0907      History   Chief Complaint Chief Complaint  Patient presents with   Exposure to STD    HPI Jaclyn Owens is a 26 y.o. female.   Patient presents due to multiweek history of lower abdominal cramping and abnormal vaginal bleeding.  Patient reports that her menstrual cycle started on 02/03/2020 and has not stopped.  She reports that this is not normal for her as she typically has a menstrual cycle every month that lasts approximately 5 to 6 days.  She is going through approximately 5-6 menstrual pads in 24 hours.  Denies dizziness, blurred vision, headache.  She denies any new urinary burning, urinary frequency, vaginal discharge, back pain, fever.  Denies any known exposure to STD.  She has had unprotected sexual intercourse recently.  She does have Nexplanon in her arm that was placed in 2021.  She has an appointment with gynecology on 03/02/2021 for further evaluation of abnormal vaginal bleeding as well as removal of Nexplanon.   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  Authorizing Provider  etonogestrel (NEXPLANON) 68 MG IMPL implant 1 each by Subdermal route once.   Yes [provider]  megestrol (MEGACE) 40 MG tablet Take 1 tablet (40 mg total) by mouth daily for 14 days. 02/20/21 03/06/21 Yes , Acie Fredrickson, FNP  Cetirizine HCl 10 MG CAPS Take 1 capsule (10 mg total) by mouth daily for 10 days. 12/03/19 12/13/19  Wieters, Hallie C, PA-C  metroNIDAZOLE (FLAGYL) 500 MG tablet Take 1 tablet (500 mg total) by mouth 2 (two) times daily. 12/19/20   Tomi Bamberger, PA-C  metroNIDAZOLE (FLAGYL) 500 MG tablet Take 1 tablet (500 mg total) by mouth 2 (two) times daily. 01/18/21   Merrilee Jansky, MD  ondansetron (ZOFRAN ODT) 4 MG disintegrating tablet Take 1 tablet (4 mg total) by mouth every 8 (eight) hours as needed for nausea or vomiting. 12/10/19   Particia Nearing, PA-C  oseltamivir (TAMIFLU) 75 MG capsule Take 1 capsule (75 mg total) by mouth every 12 (twelve) hours. 12/19/20   Tomi Bamberger, PA-C    Family History Family History  Problem Relation Age of Onset   Asthma Mother    Diabetes Maternal Aunt    Cancer Maternal Grandmother     Social History  Social History   Tobacco Use   Smoking status: Former    Types: Cigarettes, Cigars   Smokeless tobacco: Never  Vaping Use   Vaping Use: Never used  Substance Use Topics   Alcohol use: Not Currently   Drug use: No    Types: Marijuana    Comment: MJ use 01-24-15     Allergies   Bee venom   Review of Systems Review of Systems Per HPI  Physical Exam Triage Vital Signs ED Triage Vitals  Enc Vitals Group     BP 02/20/21 1152 113/73     Pulse Rate 02/20/21 1152 80     Resp 02/20/21 1152 18     Temp 02/20/21 1152 98.7 F (37.1 C)     Temp Source 02/20/21 1152 Oral     SpO2 02/20/21 1152 98 %     Weight 02/20/21 1153 130 lb (59 kg)     Height 02/20/21 1153 5\' 6"  (1.676 m)     Head Circumference --      Peak Flow --      Pain Score 02/20/21 1152 0     Pain Loc --       Pain Edu? --      Excl. in GC? --    No data found.  Updated Vital Signs BP 113/73 (BP Location: Left Arm)    Pulse 80    Temp 98.7 F (37.1 C) (Oral)    Resp 18    Ht 5\' 6"  (1.676 m)    Wt 130 lb (59 kg)    LMP 02/02/2021    SpO2 98%    BMI 20.98 kg/m   Visual Acuity Right Eye Distance:   Left Eye Distance:   Bilateral Distance:    Right Eye Near:   Left Eye Near:    Bilateral Near:     Physical Exam Constitutional:      General: She is not in acute distress.    Appearance: Normal appearance. She is not toxic-appearing or diaphoretic.  HENT:     Head: Normocephalic and atraumatic.  Eyes:     Extraocular Movements: Extraocular movements intact.     Conjunctiva/sclera: Conjunctivae normal.  Cardiovascular:     Rate and Rhythm: Normal rate and regular rhythm.     Pulses: Normal pulses.     Heart sounds: Normal heart sounds.  Pulmonary:     Effort: Pulmonary effort is normal. No respiratory distress.     Breath sounds: Normal breath sounds.  Abdominal:     General: Abdomen is flat. Bowel sounds are normal. There is no distension.     Palpations: Abdomen is soft.     Tenderness: There is no abdominal tenderness.  Neurological:     General: No focal deficit present.     Mental Status: She is alert and oriented to person, place, and time. Mental status is at baseline.  Psychiatric:        Mood and Affect: Mood normal.        Behavior: Behavior normal.        Thought Content: Thought content normal.        Judgment: Judgment normal.     UC Treatments / Results  Labs (all labs ordered are listed, but only abnormal results are displayed) Labs Reviewed  POCT URINE PREGNANCY  CERVICOVAGINAL ANCILLARY ONLY    EKG   Radiology No results found.  Procedures Procedures (including critical care time)  Medications Ordered in UC Medications - No data to display  Initial Impression / Assessment and Plan / UC Course  I have reviewed the triage vital signs and the  nursing notes.  Pertinent labs & imaging results that were available during my care of the patient were reviewed by me and considered in my medical decision making (see chart for details).     Urine pregnancy was negative.  Cervicovaginal swab pending to rule out this etiology of abnormal vaginal bleeding.  Patient is not symptomatic so will opt to treat with Megace to stop bleeding.  Patient will need to keep scheduled appointment with gynecology in the next few days for further evaluation and management of abnormal vaginal bleeding.  Patient left prior to CBC being completed.  Patient was called to return to obtain CBC but patient did not answer.  Voicemail left.  Do not think that patient is in need of immediate medical attention at the hospital this time as she is not symptomatic with bleeding.  vital signs are also stable.  Discussed strict ER precautions.  Patient verbalized understanding and was agreeable with plan. Final Clinical Impressions(s) / UC Diagnoses   Final diagnoses:  Abnormal vaginal bleeding  Screening examination for venereal disease     Discharge Instructions      Your urine pregnancy test was negative.  Your vaginal swab is pending.  We will call if it is positive and treat as appropriate.  Please refrain from sexual activity until test results and treatment are complete.  You have been prescribed a medication to help stop your bleeding.  Please keep scheduled appointment with gynecology for further evaluation and management.    ED Prescriptions     Medication Sig Dispense Auth. Provider   megestrol (MEGACE) 40 MG tablet Take 1 tablet (40 mg total) by mouth daily for 14 days. 14 tablet [place], Acie Fredrickson, [place]      PDMP not reviewed this encounter.   Gustavus Bryant, [place] 02/20/21 1332

## 2021-02-20 NOTE — Discharge Instructions (Addendum)
Your urine pregnancy test was negative.  Your vaginal swab is pending.  We will call if it is positive and treat as appropriate.  Please refrain from sexual activity until test results and treatment are complete.  You have been prescribed a medication to help stop your bleeding.  Please keep scheduled appointment with gynecology for further evaluation and management.

## 2021-02-20 NOTE — ED Triage Notes (Signed)
Patient requesting STI check, been having a lot of abdominal cramping.  Patient wants bloodwork as well.  Patient is scheduled to get her implant out on 03/02/21.

## 2021-02-21 ENCOUNTER — Telehealth (HOSPITAL_COMMUNITY): Payer: Self-pay | Admitting: Emergency Medicine

## 2021-02-21 LAB — CERVICOVAGINAL ANCILLARY ONLY
Bacterial Vaginitis (gardnerella): POSITIVE — AB
Candida Glabrata: POSITIVE — AB
Candida Vaginitis: NEGATIVE
Chlamydia: NEGATIVE
Comment: NEGATIVE
Comment: NEGATIVE
Comment: NEGATIVE
Comment: NEGATIVE
Comment: NEGATIVE
Comment: NORMAL
Neisseria Gonorrhea: NEGATIVE
Trichomonas: NEGATIVE

## 2021-02-21 MED ORDER — METRONIDAZOLE 500 MG PO TABS: 500.0000 mg | ORAL_TABLET | Freq: Two times a day (BID) | ORAL | 0 refills | Status: DC

## 2021-02-21 MED ORDER — FLUCONAZOLE 150 MG PO TABS: 150.0000 mg | ORAL_TABLET | Freq: Once | ORAL | 0 refills | Status: AC

## 2021-03-02 ENCOUNTER — Ambulatory Visit: Payer: Medicaid Other | Admitting: Obstetrics and Gynecology

## 2021-03-02 ENCOUNTER — Telehealth: Payer: Self-pay | Admitting: Obstetrics and Gynecology

## 2021-03-02 NOTE — Telephone Encounter (Signed)
Spoke with patient about cancelling her appointment. She was not happy about it because she said it was hurting. She stated she was going to look for somewhere else to go. I asked if she wanted me to reschedule the appointment for February 24th, call was disconnected. I cancelled the 24th appointment.

## 2021-03-03 ENCOUNTER — Ambulatory Visit: Payer: Medicaid Other | Admitting: Obstetrics & Gynecology

## 2021-03-22 ENCOUNTER — Ambulatory Visit (INDEPENDENT_AMBULATORY_CARE_PROVIDER_SITE_OTHER): Payer: Medicaid Other | Admitting: Obstetrics

## 2021-03-22 ENCOUNTER — Encounter: Payer: Self-pay | Admitting: Obstetrics

## 2021-03-22 ENCOUNTER — Other Ambulatory Visit: Payer: Self-pay

## 2021-03-22 VITALS — BP 120/81 | HR 91 | Wt 130.0 lb

## 2021-03-22 DIAGNOSIS — Z3009 Encounter for other general counseling and advice on contraception: Secondary | ICD-10-CM

## 2021-03-22 DIAGNOSIS — Z30013 Encounter for initial prescription of injectable contraceptive: Secondary | ICD-10-CM | POA: Diagnosis not present

## 2021-03-22 DIAGNOSIS — Z3046 Encounter for surveillance of implantable subdermal contraceptive: Secondary | ICD-10-CM | POA: Diagnosis not present

## 2021-03-22 DIAGNOSIS — N939 Abnormal uterine and vaginal bleeding, unspecified: Secondary | ICD-10-CM

## 2021-03-22 MED ORDER — MEDROXYPROGESTERONE ACETATE 150 MG/ML IM SUSP: 150.0000 mg | INTRAMUSCULAR | 3 refills | Status: DC

## 2021-03-22 MED ORDER — MEDROXYPROGESTERONE ACETATE 150 MG/ML IM SUSP
150.0000 mg | Freq: Once | INTRAMUSCULAR | Status: AC
  Administered 2021-03-22: 150 mg via INTRAMUSCULAR

## 2021-03-22 NOTE — Progress Notes (Signed)
NEXPLANON REMOVAL NOTE  Date of LMP:   unknown  Contraception used: *Nexplanon   Indications:  The patient desires removal of Nexplanon due to AUB.  She understands risks, benefits, and alternatives to Implanon and would like to proceed.  Anesthesia:   Lidocaine 1% plain.  Procedure:  A time-out was performed confirming the procedure and the patient's allergy status.  Complications: None                      The rod was palpated and the area was sterilely prepped.  The area beneath the distal tip was anesthetized with 1% xylocaine and the skin incised                       Over the tip and the tip was exposed, grasped with forcep and removed intact.  A single suture of 4-0 Vicryl was used to close incision.  Steri strip                       And a bandage applied and the arm was wrapped with gauze bandage.  The patient tolerated well.  Instructions:  The patient was instructed to remove the dressing in 24 hours and that some bruising is to be expected.  She was advised to use over the counter analgesics as needed for any pain at the site.  She is to keep the area dry for 24 hours and to call if her hand or arm becomes cold, numb, or blue.  Assessment: 1. Encounter for Nexplanon removal  2. Abnormal uterine bleeding (AUB)  3. Encounter for other general counseling and advice on contraception - wants Depo Provera  4. Encounter for initial prescription of injectable contraceptive Rx: - medroxyPROGESTERone (DEPO-PROVERA) injection 150 mg - medroxyPROGESTERone (DEPO-PROVERA) 150 MG/ML injection; Inject 1 mL (150 mg total) into the muscle every 3 (three) months.  Dispense: 1 mL; Refill: 3   Plan: Return visit:  Return in 2 weeks   Brock Bad, MD 03/22/2021 3:55 PM

## 2021-03-22 NOTE — Progress Notes (Signed)
Pt states she has been having prolong bleeding with Nexplanon.  Pt also states HA's as well.  Pt is interested in Depo.

## 2021-03-24 ENCOUNTER — Ambulatory Visit: Payer: Medicaid Other | Admitting: Student

## 2021-04-05 ENCOUNTER — Ambulatory Visit: Payer: Medicaid Other | Admitting: Obstetrics

## 2021-05-20 IMAGING — US US MFM FETAL BPP W/O NON-STRESS
1 series · 14 of 28 positions shown · non-contrast
Comparison: none

[Series 1: us mfm fetal bpp w/o non-stress · 33 acquisitions, 14 frames shown]
[im 2/33]
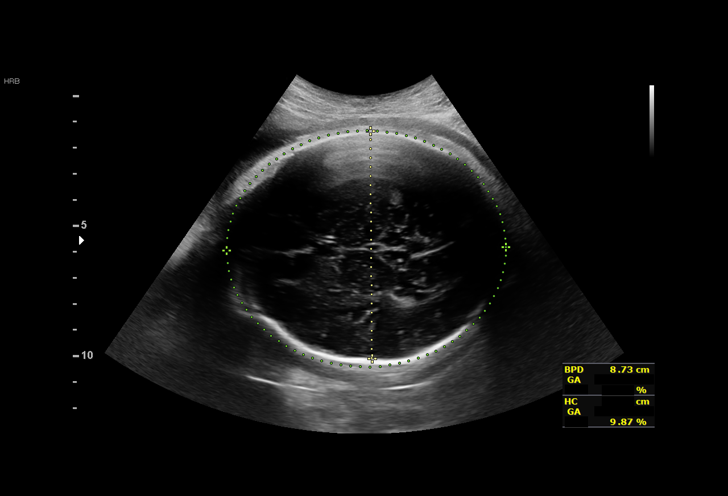
[im 4/33]
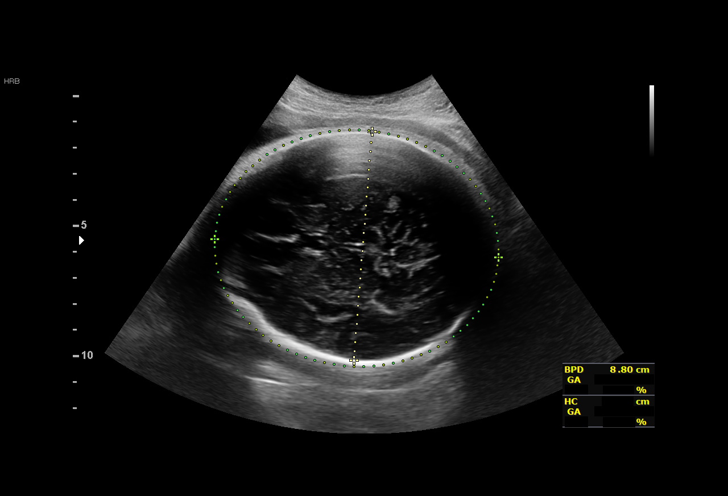
[im 6/33]
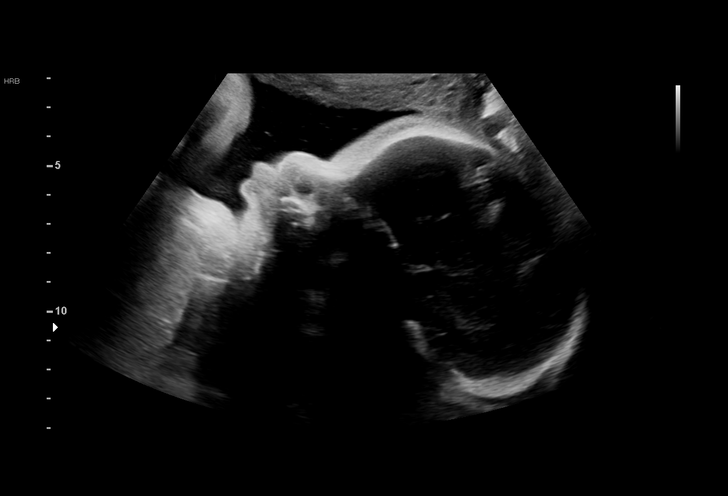
[im 9/33]
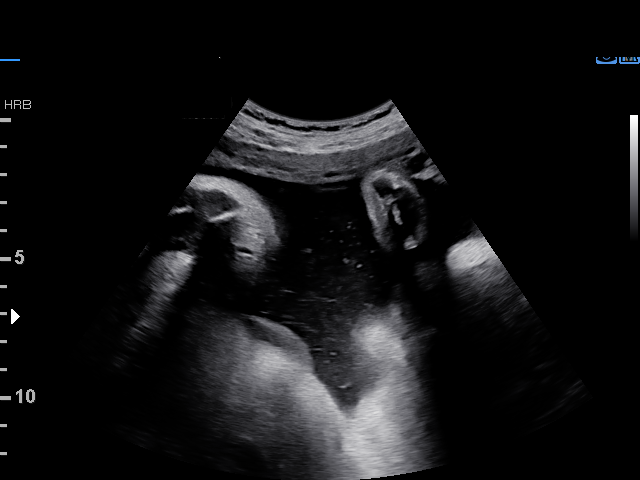
[im 11/33]
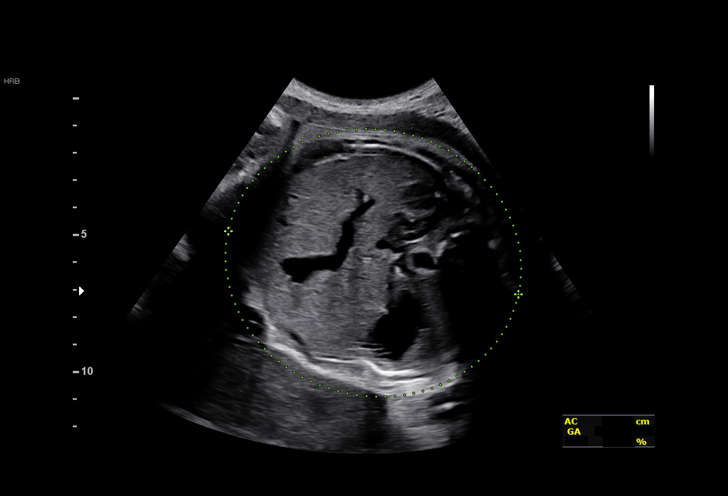
[im 14/33]
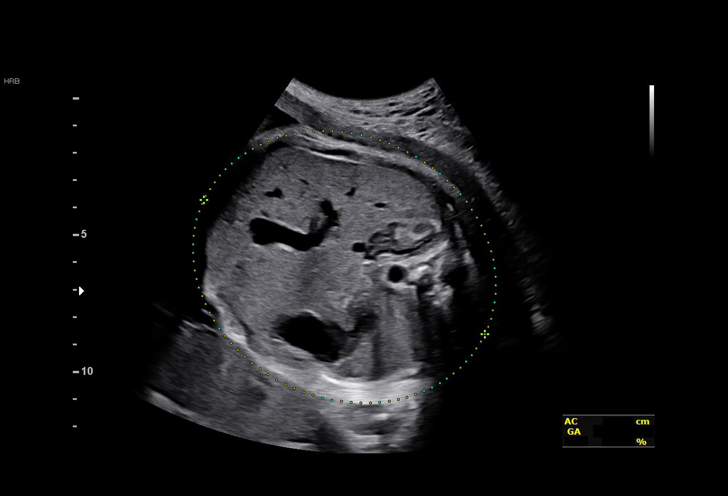
[im 16/33]
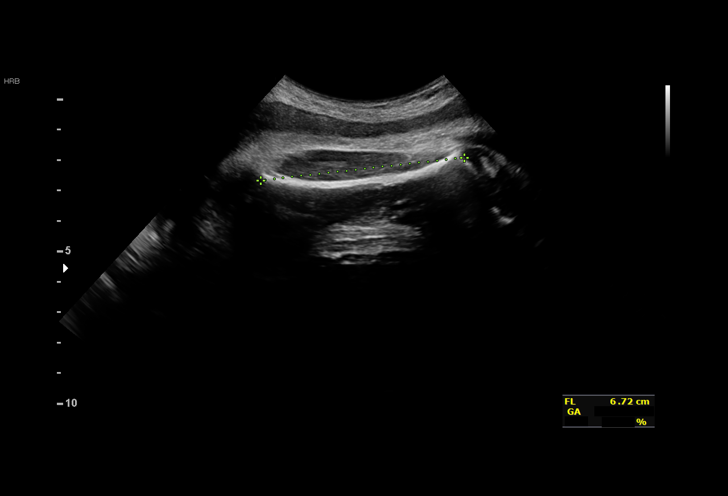
[im 18/33]
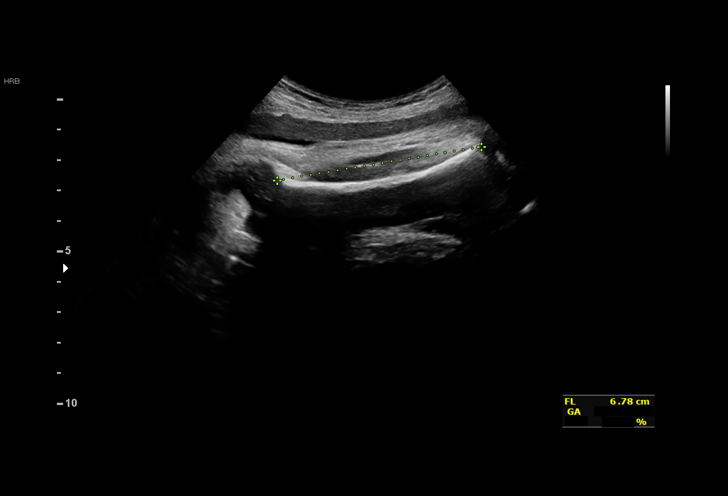
[im 21/33]
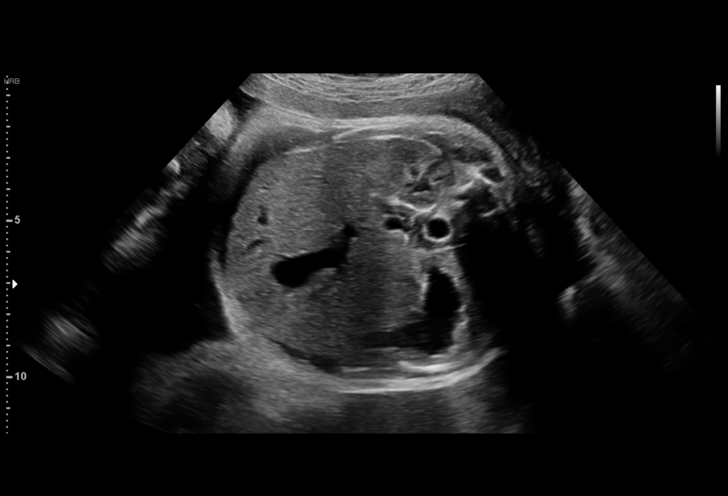
[im 23/33]
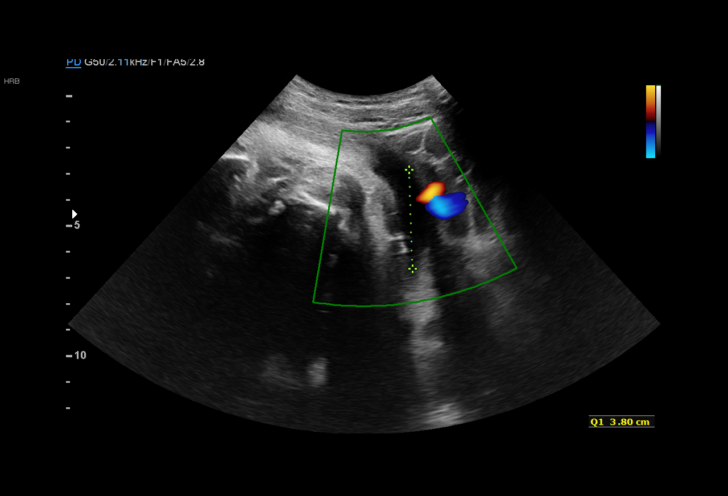
[im 25/33]
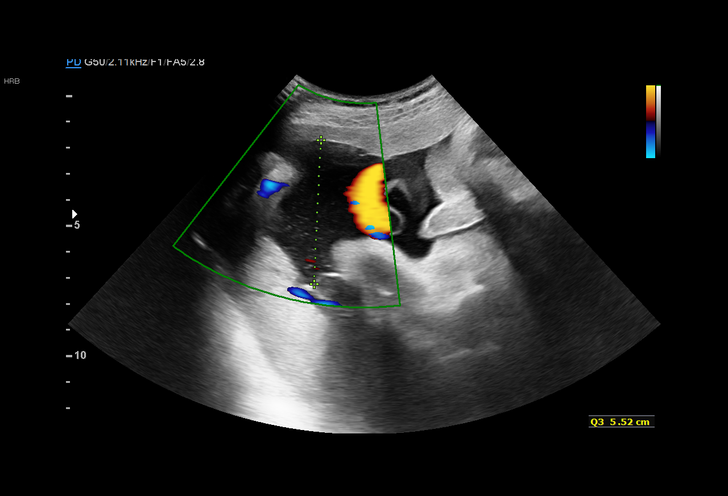
[im 28/33]
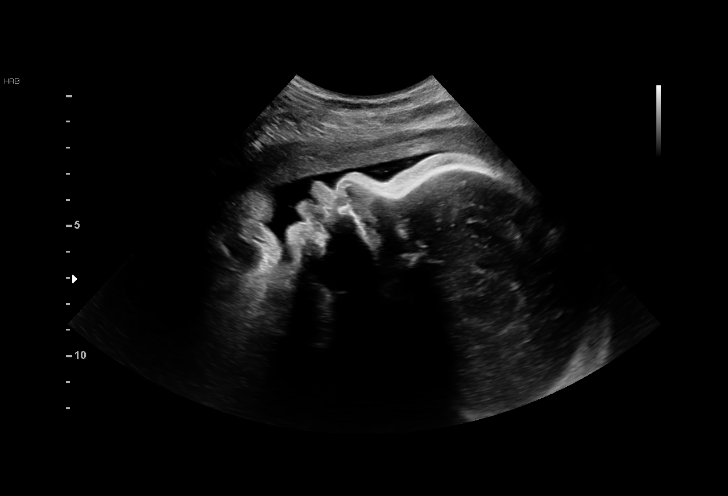
[im 30/33]
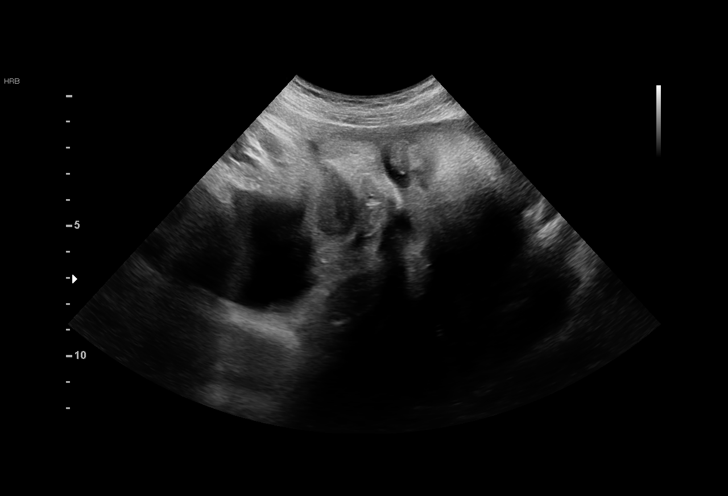
[im 33/33]
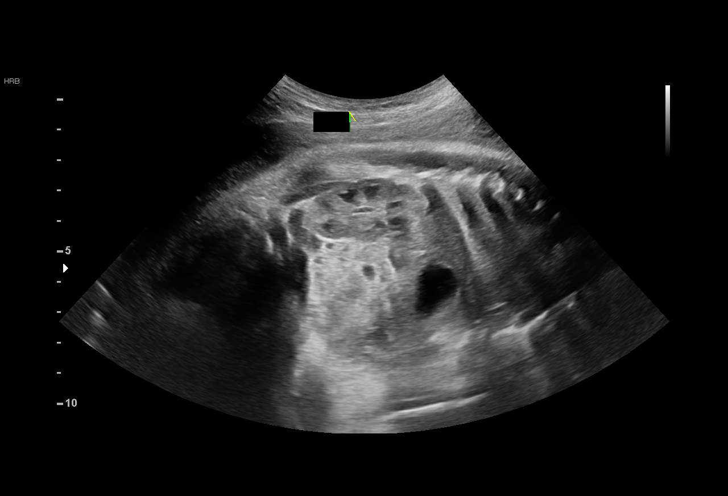

[14 of 28 positions shown; findings below may reference images not displayed]

----------------------------------------------------------------------

 ----------------------------------------------------------------------
Indications

  Gestational diabetes in pregnancy,
  controlled by oral hypoglycemic drugs
  Short interval between pregancies, 2nd
  trimester
  35 weeks gestation of pregnancy
 ----------------------------------------------------------------------
Vital Signs

 BMI:
Fetal Evaluation

 Num Of Fetuses:         1
 Fetal Heart Rate(bpm):  161
 Cardiac Activity:       Observed
 Presentation:           Cephalic
 Placenta:               Posterior

 Amniotic Fluid
 AFI FV:      Within normal limits

 AFI Sum(cm)     %Tile       Largest Pocket(cm)
 17.58           65

 RUQ(cm)       RLQ(cm)       LUQ(cm)        LLQ(cm)

Biophysical Evaluation

 Amniotic F.V:   Pocket => 2 cm             F. Tone:        Observed
 F. Movement:    Observed                   Score:          [DATE]
 F. Breathing:   Observed
Biometry

 BPD:      87.7  mm     G. Age:  35w 3d         55  %    CI:        79.13   %    70 - 86
                                                         FL/HC:      21.5   %    20.1 -
 HC:      311.7  mm     G. Age:  34w 6d         10  %    HC/AC:      0.95        0.93 -
 AC:      328.7  mm     G. Age:  36w 5d         89  %    FL/BPD:     76.4   %    71 - 87
 FL:         67  mm     G. Age:  34w 3d         20  %    FL/AC:      20.4   %    20 - 24
 LV:        4.3  mm

 Est. FW:    8718  gm      6 lb 2 oz     61  %
OB History

 Gravidity:    3         Term:   2        Prem:   0        SAB:   0
 TOP:          0       Ectopic:  0        Living: 2
Gestational Age

 LMP:           36w 3d        Date:  05/26/18                 EDD:   03/02/19
 U/S Today:     35w 3d                                        EDD:   03/09/19
 Best:          35w 3d     Det. By:  Early Ultrasound         EDD:   03/09/19
                                     (08/13/18)
Anatomy

 Cranium:               Appears normal         Ductal Arch:            Previously seen
 Cavum:                 Previously seen        Diaphragm:              Appears normal
 Ventricles:            Appears normal         Stomach:                Appears normal, left
                                                                       sided
 Choroid Plexus:        Previously seen        Abdomen:                Previously seen
 Cerebellum:            Previously seen        Abdominal Wall:         Previously seen
 Posterior Fossa:       Previously seen        Cord Vessels:           Previously seen
 Face:                  Previously seen        Kidneys:                Appear normal
 Lips:                  Previously seen        Bladder:                Appears normal
 Heart:                 Previously seen        Spine:                  Previously seen
 RVOT:                  Previously seen        Upper Extremities:      Previously seen
 LVOT:                  Previously seen        Lower Extremities:      Previously seen
 Aortic Arch:           Previously seen
Comments

 This patient was seen for a follow up growth scan due to A2
 gestational diabetes.  She reports that as her finger stick
 values remain elevated, she was recently switched from
 Metformin to glyburide for treatment.
 She was informed that the fetal growth and amniotic fluid
 level appears appropriate for her gestational age.
 A biophysical  profile performed today was [DATE].
 A follow-up exam was scheduled in 1 week.
 Should her diabetes remain uncontrolled, delivery is
 recommended at around 37 weeks.

## 2021-06-07 ENCOUNTER — Ambulatory Visit (INDEPENDENT_AMBULATORY_CARE_PROVIDER_SITE_OTHER): Payer: Medicaid Other

## 2021-06-07 VITALS — BP 109/70 | HR 76 | Ht 66.0 in | Wt 138.0 lb

## 2021-06-07 DIAGNOSIS — Z3042 Encounter for surveillance of injectable contraceptive: Secondary | ICD-10-CM | POA: Diagnosis not present

## 2021-06-07 MED ORDER — MEDROXYPROGESTERONE ACETATE 150 MG/ML IM SUSP
150.0000 mg | Freq: Once | INTRAMUSCULAR | Status: AC
  Administered 2021-06-07: 150 mg via INTRAMUSCULAR

## 2021-06-07 NOTE — Progress Notes (Signed)
SUBJECTIVE: Jaclyn Owens is a 26 y.o. female who presents for DEPO Injection. ? ?OBJECTIVE: Appears well, in no apparent distress.  Vital signs are normal. ? ?ASSESSMENT: Need for BC.  Last PAP 08/29/2017 ? ?PLAN: DEPO Injection given in RD, tolerated well.  ? ?Next DEPO due July 26 - September 06, 2021 ? ?Make appointment for AEX/PAP ? ?Administrations This Visit   ? ? medroxyPROGESTERone (DEPO-PROVERA) injection 150 mg   ? ? Admin Date ?06/07/2021 Action ?Given Dose ?150 mg Route ?Intramuscular Administered By ?Maretta Bees, RMA  ? ?  ?  ? ?  ?  ?

## 2021-08-23 ENCOUNTER — Ambulatory Visit: Payer: Medicaid Other | Admitting: Student

## 2021-09-05 ENCOUNTER — Encounter (HOSPITAL_COMMUNITY): Payer: Self-pay | Admitting: *Deleted

## 2021-09-05 ENCOUNTER — Ambulatory Visit (HOSPITAL_COMMUNITY)
Admission: EM | Admit: 2021-09-05 | Discharge: 2021-09-05 | Disposition: A | Discharge status: Home / Self Care | Payer: Medicaid Other | Attending: Physician Assistant | Admitting: Physician Assistant

## 2021-09-05 DIAGNOSIS — M545 Low back pain, unspecified: Secondary | ICD-10-CM | POA: Diagnosis not present

## 2021-09-05 DIAGNOSIS — N898 Other specified noninflammatory disorders of vagina: Secondary | ICD-10-CM | POA: Diagnosis not present

## 2021-09-05 DIAGNOSIS — Z8619 Personal history of other infectious and parasitic diseases: Secondary | ICD-10-CM | POA: Diagnosis not present

## 2021-09-05 DIAGNOSIS — Z113 Encounter for screening for infections with a predominantly sexual mode of transmission: Secondary | ICD-10-CM | POA: Insufficient documentation

## 2021-09-05 DIAGNOSIS — R350 Frequency of micturition: Secondary | ICD-10-CM | POA: Insufficient documentation

## 2021-09-05 DIAGNOSIS — Z760 Encounter for issue of repeat prescription: Secondary | ICD-10-CM | POA: Insufficient documentation

## 2021-09-05 LAB — POCT URINALYSIS DIPSTICK, ED / UC
Bilirubin Urine: NEGATIVE
Glucose, UA: NEGATIVE mg/dL
Hgb urine dipstick: NEGATIVE
Ketones, ur: NEGATIVE mg/dL
Leukocytes,Ua: NEGATIVE
Nitrite: NEGATIVE
Protein, ur: NEGATIVE mg/dL
Specific Gravity, Urine: 1.02 (ref 1.005–1.030)
Urobilinogen, UA: 0.2 mg/dL (ref 0.0–1.0)
pH: 7.5 (ref 5.0–8.0)

## 2021-09-05 LAB — POC URINE PREG, ED: Preg Test, Ur: NEGATIVE

## 2021-09-05 LAB — HIV ANTIBODY (ROUTINE TESTING W REFLEX): HIV Screen 4th Generation wRfx: NONREACTIVE

## 2021-09-05 MED ORDER — VALACYCLOVIR HCL 500 MG PO TABS: 500.0000 mg | ORAL_TABLET | Freq: Two times a day (BID) | ORAL | 0 refills | Status: DC

## 2021-09-05 MED ORDER — BACLOFEN 10 MG PO TABS: 10.0000 mg | ORAL_TABLET | Freq: Two times a day (BID) | ORAL | 0 refills | Status: DC

## 2021-09-05 MED ORDER — IBUPROFEN 800 MG PO TABS
ORAL_TABLET | ORAL | Status: AC
  Filled 2021-09-05: qty 1

## 2021-09-05 MED ORDER — IBUPROFEN 800 MG PO TABS
800.0000 mg | ORAL_TABLET | Freq: Once | ORAL | Status: AC
  Administered 2021-09-05: 800 mg via ORAL

## 2021-09-05 MED ORDER — NAPROXEN 375 MG PO TABS: 375.0000 mg | ORAL_TABLET | Freq: Two times a day (BID) | ORAL | 0 refills | Status: DC

## 2021-09-05 NOTE — Discharge Instructions (Addendum)
Your urine was normal.  I believe that your back pain is related to muscles.  Start Naprosyn twice daily.  Do not take NSAIDs including aspirin, ibuprofen/Advil, naproxen/Aleve with this medication as it can cause stomach bleeding.  Use Tylenol for pain.  Use baclofen up to twice a day.  This make you sleepy so do not drive or drink alcohol with taking it.  I also recommend heat and gentle stretch.  We will contact you if any of your STI testing is positive to arrange treatment.  Make sure you rest and drink plenty fluid.  Wear loosefitting clothing and avoid use hypoallergenic soaps and detergents.  If you have any worsening symptoms including abdominal pain, fever, nausea, vomiting, pelvic pain you need to be seen immediately.

## 2021-09-05 NOTE — ED Triage Notes (Addendum)
Pt c/o low back pain since yesterday. Has been having some slight vaginal discharge - pt vague in describing onset. Wishes to be checked for UTI and STDs. Pt also requesting refill of valacyclovir, but denies current outbreak.

## 2021-09-05 NOTE — ED Provider Notes (Addendum)
[facility]    CSN: [id number] Arrival date & time: 09/05/21  1937      History   Chief Complaint Chief Complaint  Patient presents with   Back Pain   Vaginal Discharge   Medication Refill    HPI Jaclyn Owens is a 26 y.o. female.   Patient presents today with several concerns.  Her primary concern today is several day history of lower back pain.  Pain is rated 7 on a 0-10 pain scale, localized to lumbar midline area without radiation, described as aching, no aggravating relieving factors notified.  She denies any known injury or increased activity prior to symptom onset.  Denies any bowel/bladder incontinence, lower extremity weakness, saddle anesthesia.  Denies history of malignancy.  She does report some urinary symptoms including frequency and so is requesting that we check her urine.  Denies recurrent UTIs or nephrolithiasis.  Denies any recent antibiotics.  She also reports some abnormal discharge which she describes as bigger than normal.  Denies any significant odor or discoloration.  She is sexually active with female partners but does not consistently use condoms.  Denies any recent antibiotic use or medication changes.  She is also requesting a refill of Valtrex to have on hand.  Denies any current symptoms.  She is confident that she is not pregnant as she is on Depo.    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 Authorizing Provider  baclofen (LIORESAL) 10 MG tablet Take 1 tablet (10 mg total) by mouth 2 (two) times daily. 09/05/21  Yes Theodore Virgin, Noberto Retort, PA-C  medroxyPROGESTERone (DEPO-PROVERA) 150 MG/ML injection Inject 1 mL (150 mg total) into the muscle every 3 (three) months. 03/22/21  Yes Brock Bad, MD  naproxen (NAPROSYN) 375 MG tablet Take 1 tablet (375 mg total) by mouth 2 (two) times daily. 09/05/21  Yes Elhadj Girton, Noberto Retort, PA-C  valACYclovir (VALTREX) 500 MG tablet Take 1 tablet (500 mg total) by mouth 2 (two) times daily. 09/05/21  Yes Loree Shehata K, PA-C  Cetirizine HCl 10 MG CAPS Take 1 capsule (10 mg total) by mouth daily for 10 days. 12/03/19 12/13/19  Wieters, Junius Creamer, PA-C    Family History Family History  Problem Relation Age of Onset   Asthma Mother    Diabetes Maternal Aunt    Cancer Maternal Grandmother     Social History Social History   Tobacco Use  Smoking status: Former    Types: Cigarettes, Cigars   Smokeless tobacco: Never  Vaping Use   Vaping Use: Never used  Substance Use Topics   Alcohol use: Yes    Comment: occasionally   Drug use: Yes    Types: Marijuana    Comment: MJ use 01-24-15     Allergies   Bee venom   Review of Systems Review of Systems  Constitutional:  Positive for activity change. Negative for appetite change, fatigue and fever.  Respiratory:  Negative for cough and shortness of breath.   Cardiovascular:  Negative for chest pain.  Gastrointestinal:  Negative for abdominal pain, diarrhea, nausea and vomiting.  Genitourinary:  Positive for frequency, urgency and vaginal discharge. Negative for dysuria, pelvic pain, vaginal bleeding and vaginal pain.  Musculoskeletal:  Positive for back  pain. Negative for arthralgias and myalgias.     Physical Exam Triage Vital Signs ED Triage Vitals  Enc Vitals Group     BP 09/05/21 1946 119/71     Pulse Rate 09/05/21 1946 74     Resp 09/05/21 1946 16     Temp 09/05/21 1946 98.3 F (36.8 C)     Temp Source 09/05/21 1946 Oral     SpO2 09/05/21 1946 100 %     Weight --      Height --      Head Circumference --      Peak Flow --      Pain Score 09/05/21 1947 6     Pain Loc --      Pain Edu? --      Excl. in GC? --    No data found.  Updated Vital Signs BP 119/71   Pulse 74   Temp 98.3 F (36.8 C) (Oral)   Resp 16   SpO2 100%   Visual Acuity Right Eye Distance:   Left Eye Distance:   Bilateral Distance:    Right Eye Near:   Left Eye Near:    Bilateral Near:     Physical Exam Vitals reviewed.  Constitutional:      General: She is awake. She is not in acute distress.    Appearance: Normal appearance. She is well-developed. She is not ill-appearing.     Comments: Very pleasant female appears stated age in no acute distress sitting comfortably in exam room  HENT:     Head: Normocephalic and atraumatic.  Cardiovascular:     Rate and Rhythm: Normal rate and regular rhythm.     Heart sounds: Normal heart sounds, S1 normal and S2 normal. No murmur heard. Pulmonary:     Effort: Pulmonary effort is normal.     Breath sounds: Normal breath sounds. No wheezing, rhonchi or rales.     Comments: Clear to auscultation bilaterally Abdominal:     General: Bowel sounds are normal.     Palpations: Abdomen is soft.     Tenderness: There is no abdominal tenderness. There is no right CVA tenderness, left CVA tenderness, guarding or rebound.  Musculoskeletal:     Cervical back: No tenderness or bony tenderness.     Thoracic back: No tenderness or bony tenderness.     Lumbar back: Tenderness present. No bony tenderness. Negative right straight leg raise test and negative left straight leg raise test.  Psychiatric:         Behavior: Behavior is cooperative.      UC Treatments / Results  Labs (all labs ordered are listed, but only abnormal results are displayed)  Labs Reviewed  HIV ANTIBODY (ROUTINE TESTING W REFLEX)  RPR  POCT URINALYSIS DIPSTICK, ED / UC  POC URINE PREG, ED  CERVICOVAGINAL ANCILLARY ONLY    EKG   Radiology No results found.  Procedures Procedures (including critical care time)  Medications Ordered in UC Medications  ibuprofen (ADVIL) tablet 800 mg (has no administration in time range)    Initial Impression / Assessment and Plan / UC Course  I have reviewed the triage vital signs and the nursing notes.  Pertinent labs & imaging results that were available during my care of the patient were reviewed by me and considered in my medical decision making (see chart for details).     Suspect back pain is musculoskeletal in etiology.  No indication for plain films as patient denies any recent trauma and has no significant bony tenderness.  UA was normal.  Urine pregnancy was negative.  STI testing was obtained today including swab as well as HIV/syphilis.  Will defer treatment until results are available.  Patient was started on Naprosyn for pain relief with instruction not to take additional NSAIDs with this medication due to risk of GI bleeding.  She was also started on baclofen with instruction not to drive or drink alcohol with taking this medication as drowsiness is a common side effect.  She is to rest and drink plenty of fluids.  Recommend she avoid strenuous activity.  She can use heat and gentle stretch for symptom relief.  She did request a refill of Valtrex and so this was provided.  Recommended close follow-up with her primary care.  Discussed that we will contact her if we need to arrange additional treatment.  She is to return with any concerning symptoms including abdominal pain, fever, nausea, vomiting, worsening back pain, lower extremity weakness, saddle anesthesia, pelvic  pain.  Strict return precautions given to which she expressed understanding.    Final Clinical Impressions(s) / UC Diagnoses   Final diagnoses:  Acute bilateral low back pain without sciatica  Urinary frequency  Vaginal discharge  Routine screening for STI (sexually transmitted infection)  Medication refill  History of herpes simplex infection     Discharge Instructions      Your urine was normal.  I believe that your back pain is related to muscles.  Start Naprosyn twice daily.  Do not take NSAIDs including aspirin, ibuprofen/Advil, naproxen/Aleve with this medication as it can cause stomach bleeding.  Use Tylenol for pain.  Use baclofen up to twice a day.  This make you sleepy so do not drive or drink alcohol with taking it.  I also recommend heat and gentle stretch.  We will contact you if any of your STI testing is positive to arrange treatment.  Make sure you rest and drink plenty fluid.  Wear loosefitting clothing and avoid use hypoallergenic soaps and detergents.  If you have any worsening symptoms including abdominal pain, fever, nausea, vomiting, pelvic pain you need to be seen immediately.     ED Prescriptions     Medication Sig Dispense Auth. Provider   valACYclovir (VALTREX) 500 MG tablet Take 1 tablet (500 mg total) by mouth 2 (two) times daily. 20 tablet Kashira Behunin K, PA-C   naproxen (NAPROSYN) 375 MG tablet Take 1 tablet (375 mg total) by mouth 2 (two) times daily. 20 tablet Floride Hutmacher K, PA-C   baclofen (LIORESAL) 10 MG tablet Take 1 tablet (10 mg total) by mouth 2 (two) times daily. 20 each Fusako Tanabe, Noberto Retort, PA-C  PDMP not reviewed this encounter.   Jeani Hawking, PA-C 09/05/21 2029    Sitlaly Gudiel, Noberto Retort, PA-C 09/05/21 2031

## 2021-09-06 LAB — CERVICOVAGINAL ANCILLARY ONLY
Bacterial Vaginitis (gardnerella): NEGATIVE
Candida Glabrata: NEGATIVE
Candida Vaginitis: NEGATIVE
Chlamydia: NEGATIVE
Comment: NEGATIVE
Comment: NEGATIVE
Comment: NEGATIVE
Comment: NEGATIVE
Comment: NEGATIVE
Comment: NORMAL
Neisseria Gonorrhea: NEGATIVE
Trichomonas: NEGATIVE

## 2021-09-06 LAB — RPR: RPR Ser Ql: NONREACTIVE

## 2021-10-09 ENCOUNTER — Ambulatory Visit (HOSPITAL_COMMUNITY)
Admission: EM | Admit: 2021-10-09 | Discharge: 2021-10-09 | Disposition: A | Discharge status: Home / Self Care | Payer: Medicaid Other | Attending: Emergency Medicine | Admitting: Emergency Medicine

## 2021-10-09 ENCOUNTER — Encounter (HOSPITAL_COMMUNITY): Payer: Self-pay

## 2021-10-09 DIAGNOSIS — Z3202 Encounter for pregnancy test, result negative: Secondary | ICD-10-CM

## 2021-10-09 DIAGNOSIS — Z202 Contact with and (suspected) exposure to infections with a predominantly sexual mode of transmission: Secondary | ICD-10-CM

## 2021-10-09 LAB — POCT URINALYSIS DIPSTICK, ED / UC
Bilirubin Urine: NEGATIVE
Glucose, UA: NEGATIVE mg/dL
Ketones, ur: NEGATIVE mg/dL
Leukocytes,Ua: NEGATIVE
Nitrite: NEGATIVE
Protein, ur: NEGATIVE mg/dL
Specific Gravity, Urine: 1.025 (ref 1.005–1.030)
Urobilinogen, UA: 1 mg/dL (ref 0.0–1.0)
pH: 6.5 (ref 5.0–8.0)

## 2021-10-09 LAB — HIV ANTIBODY (ROUTINE TESTING W REFLEX): HIV Screen 4th Generation wRfx: NONREACTIVE

## 2021-10-09 LAB — POC URINE PREG, ED: Preg Test, Ur: NEGATIVE

## 2021-10-09 NOTE — ED Triage Notes (Signed)
Patient would like to be tested for STDs due to unprotected sex.   No known exposure, no symptoms.

## 2021-10-09 NOTE — Discharge Instructions (Addendum)
Avoid sexual activity until results known. Wear condoms with future sexual activity. Your UA was negative for pregnancy/UTI today. You will be notified if additional treatment is indicated. Please follow up with local health department or GYN for recurrent STI testing.

## 2021-10-09 NOTE — ED Provider Notes (Signed)
[facility]    CSN: [id number] Arrival date & time: 10/09/21  1446      History   Chief Complaint Chief Complaint  Patient presents with   SEXUALLY TRANSMITTED DISEASE    Testing     HPI Jaclyn Owens is a 26 y.o. female.   26 year old female, Jaclyn Owens, presents to urgent care for STI testing.  Pt states she has had unprotected sex, no known exposure, patient denies any symptoms.  The history is provided by the patient. No language interpreter was used.    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 Authorizing Provider  baclofen (LIORESAL) 10 MG tablet Take 1 tablet (10 mg total) by mouth 2 (two) times daily. 09/05/21   Raspet, Noberto Retort, PA-C  Cetirizine HCl 10 MG CAPS Take 1 capsule (10 mg total) by mouth daily for 10 days. 12/03/19 12/13/19  Wieters, Hallie C, PA-C  medroxyPROGESTERone (DEPO-PROVERA) 150 MG/ML injection Inject 1 mL (150 mg total) into the muscle every 3 (three) months. 03/22/21   Brock Bad, MD  naproxen (NAPROSYN) 375 MG tablet Take 1 tablet (375 mg total) by mouth 2 (two) times daily. 09/05/21    Raspet, Noberto Retort, PA-C  valACYclovir (VALTREX) 500 MG tablet Take 1 tablet (500 mg total) by mouth 2 (two) times daily. 09/05/21   Raspet, Noberto Retort, PA-C    Family History Family History  Problem Relation Age of Onset   Asthma Mother    Diabetes Maternal Aunt    Cancer Maternal Grandmother     Social History Social History   Tobacco Use   Smoking status: Former    Types: Cigarettes, Cigars   Smokeless tobacco: Never  Vaping Use   Vaping Use: Never used  Substance Use Topics   Alcohol use: Yes    Comment: occasionally   Drug use: Yes    Types: Marijuana    Comment: MJ use 01-24-15     Allergies   Bee venom   Review of Systems Review of Systems  Genitourinary:  Negative for dysuria and vaginal discharge.  All other systems reviewed and are negative.    Physical Exam Triage Vital Signs ED Triage Vitals  Enc Vitals Group     BP 10/09/21 1613 118/82     Pulse Rate 10/09/21 1613 71     Resp 10/09/21 1613 16  Temp 10/09/21 1613 98.1 F (36.7 C)     Temp Source 10/09/21 1613 Oral     SpO2 10/09/21 1613 98 %     Weight 10/09/21 1615 131 lb (59.4 kg)     Height 10/09/21 1615 5\' 6"  (1.676 m)     Head Circumference --      Peak Flow --      Pain Score 10/09/21 1615 0     Pain Loc --      Pain Edu? --      Excl. in GC? --    No data found.  Updated Vital Signs BP 118/82 (BP Location: Right Arm)   Pulse 71   Temp 98.1 F (36.7 C) (Oral)   Resp 16   Ht 5\' 6"  (1.676 m)   Wt 131 lb (59.4 kg)   LMP 09/29/2021 (Exact Date)   SpO2 98%   BMI 21.14 kg/m   Visual Acuity Right Eye Distance:   Left Eye Distance:   Bilateral Distance:    Right Eye Near:   Left Eye Near:    Bilateral Near:     Physical Exam Vitals and nursing note reviewed.  Constitutional:      General: She is not in acute distress.    Appearance: She is well-developed and well-groomed.  HENT:     Head: Normocephalic and atraumatic.  Eyes:     Conjunctiva/sclera: Conjunctivae normal.   Cardiovascular:     Rate and Rhythm: Normal rate and regular rhythm.     Pulses: Normal pulses.     Heart sounds: Normal heart sounds. No murmur heard. Pulmonary:     Effort: Pulmonary effort is normal. No respiratory distress.     Breath sounds: Normal breath sounds and air entry.  Abdominal:     Palpations: Abdomen is soft.     Tenderness: There is no abdominal tenderness.  Musculoskeletal:        General: No swelling.     Cervical back: Neck supple.  Skin:    General: Skin is warm and dry.     Capillary Refill: Capillary refill takes less than 2 seconds.  Neurological:     General: No focal deficit present.     Mental Status: She is alert and oriented to person, place, and time.     GCS: GCS eye subscore is 4. GCS verbal subscore is 5. GCS motor subscore is 6.  Psychiatric:        Attention and Perception: Attention normal.        Mood and Affect: Mood normal.        Speech: Speech normal.        Behavior: Behavior normal. Behavior is cooperative.      UC Treatments / Results  Labs (all labs ordered are listed, but only abnormal results are displayed) Labs Reviewed  POCT URINALYSIS DIPSTICK, ED / UC - Abnormal; Notable for the following components:      Result Value   Hgb urine dipstick MODERATE (*)    All other components within normal limits  RPR  HIV ANTIBODY (ROUTINE TESTING W REFLEX)  POC URINE PREG, ED  CERVICOVAGINAL ANCILLARY ONLY    EKG   Radiology No results found.  Procedures Procedures (including critical care time)  Medications Ordered in UC Medications - No data to display  Initial Impression / Assessment and Plan / UC Course  I have reviewed the triage vital signs and the nursing notes.  Pertinent labs & imaging results that were available during  my care of the patient were reviewed by me and considered in my medical decision making (see chart for details).     Ddx: STI, UTI Final Clinical Impressions(s) / UC Diagnoses   Final  diagnoses:  Possible exposure to STD     Discharge Instructions      Avoid sexual activity until results known. Wear condoms with future sexual activity. Your UA was negative for pregnancy/UTI today. You will be notified if additional treatment is indicated. Please follow up with local health department or GYN for recurrent STI testing.      ED Prescriptions   None    PDMP not reviewed this encounter.   Clancy Gourd, NP 10/09/21 1751

## 2021-10-10 LAB — CERVICOVAGINAL ANCILLARY ONLY
Bacterial Vaginitis (gardnerella): POSITIVE — AB
Candida Glabrata: NEGATIVE
Candida Vaginitis: NEGATIVE
Chlamydia: NEGATIVE
Comment: NEGATIVE
Comment: NEGATIVE
Comment: NEGATIVE
Comment: NEGATIVE
Comment: NEGATIVE
Comment: NORMAL
Neisseria Gonorrhea: NEGATIVE
Trichomonas: NEGATIVE

## 2021-10-10 LAB — RPR: RPR Ser Ql: NONREACTIVE

## 2021-10-11 ENCOUNTER — Telehealth (HOSPITAL_COMMUNITY): Payer: Self-pay | Admitting: Emergency Medicine

## 2021-10-11 MED ORDER — METRONIDAZOLE 500 MG PO TABS: 500.0000 mg | ORAL_TABLET | Freq: Two times a day (BID) | ORAL | 0 refills | Status: DC

## 2021-11-30 ENCOUNTER — Encounter: Payer: Self-pay | Admitting: Emergency Medicine

## 2021-11-30 ENCOUNTER — Ambulatory Visit
Admission: EM | Admit: 2021-11-30 | Discharge: 2021-11-30 | Disposition: A | Discharge status: Home / Self Care | Payer: Medicaid Other | Attending: Urgent Care | Admitting: Urgent Care

## 2021-11-30 DIAGNOSIS — Z113 Encounter for screening for infections with a predominantly sexual mode of transmission: Secondary | ICD-10-CM | POA: Diagnosis not present

## 2021-11-30 DIAGNOSIS — J4530 Mild persistent asthma, uncomplicated: Secondary | ICD-10-CM | POA: Insufficient documentation

## 2021-11-30 DIAGNOSIS — Z7251 High risk heterosexual behavior: Secondary | ICD-10-CM | POA: Insufficient documentation

## 2021-11-30 DIAGNOSIS — B349 Viral infection, unspecified: Secondary | ICD-10-CM | POA: Insufficient documentation

## 2021-11-30 DIAGNOSIS — U071 COVID-19: Secondary | ICD-10-CM | POA: Diagnosis not present

## 2021-11-30 MED ORDER — PROMETHAZINE-DM 6.25-15 MG/5ML PO SYRP: 5.0000 mL | ORAL_SOLUTION | Freq: Three times a day (TID) | ORAL | 0 refills | Status: DC | PRN

## 2021-11-30 MED ORDER — LEVOCETIRIZINE DIHYDROCHLORIDE 5 MG PO TABS: 5.0000 mg | ORAL_TABLET | Freq: Every evening | ORAL | 0 refills | Status: DC

## 2021-11-30 MED ORDER — PSEUDOEPHEDRINE HCL 60 MG PO TABS: 60.0000 mg | ORAL_TABLET | Freq: Three times a day (TID) | ORAL | 0 refills | Status: DC | PRN

## 2021-11-30 NOTE — ED Provider Notes (Signed)
[facility]  Note:  This document was prepared using Conservation officer, historic buildings and may include unintentional dictation errors.  MRN: [id number] DOB: 1995/08/24  Subjective:   Jaclyn Owens is a 26 y.o. female presenting for multiple concerns.  Has had multiple sick contacts.  For the past several days has had congestion and coughing.  No overt chest pain, shortness of breath or wheezing.  Patient does have a history of asthma.  Patient would like an STI check.  No vaginal discharge, urinary symptoms.  She does have unprotected sex.  She just finished her cycle a couple of days ago.  Has no concerns for pregnancy.   No current facility-administered medications for this encounter.  Current Outpatient Medications:    metroNIDAZOLE (FLAGYL) 500 MG tablet, Take 1 tablet (500 mg total) by mouth 2 (two) times daily., Disp: 14 tablet, Rfl: 0   baclofen (LIORESAL) 10 MG tablet, Take 1 tablet (10 mg total) by mouth 2 (two) times daily., Disp: 20 each, Rfl: 0   Cetirizine HCl 10 MG CAPS, Take 1 capsule (10 mg total) by mouth daily for 10 days., Disp: 10 capsule, Rfl: 0   medroxyPROGESTERone (DEPO-PROVERA) 150 MG/ML injection, Inject 1 mL (150 mg total) into the muscle every 3 (three) months., Disp: 1 mL, Rfl: 3   naproxen (NAPROSYN) 375 MG tablet, Take 1 tablet (375 mg total) by mouth 2 (two) times daily., Disp: 20 tablet, Rfl: 0   valACYclovir (VALTREX) 500 MG tablet, Take 1 tablet (500 mg total) by mouth 2 (two) times daily., Disp: 20 tablet, Rfl: 0   Allergies  Allergen Reactions   Bee Venom Anaphylaxis    Past Medical History:  Diagnosis Date   Asthma    HSV (herpes simplex virus) infection      Past Surgical History:  Procedure Laterality Date   ARM WOUND REPAIR / CLOSURE      Family History  Problem Relation Age of Onset   Asthma Mother    Diabetes Maternal Aunt    Cancer Maternal Grandmother     Social History   Tobacco Use   Smoking  status: Former    Types: Cigarettes, Cigars   Smokeless tobacco: Never  Vaping Use   Vaping Use: Never used  Substance Use Topics   Alcohol use: Yes    Comment: occasionally   Drug use: Yes    Types: Marijuana    Comment: MJ use 01-24-15    ROS   Objective:   Vitals: LMP 11/25/2021   Physical Exam Constitutional:      General: She is not in acute distress.    Appearance: Normal appearance. She is well-developed. She is not ill-appearing, toxic-appearing or diaphoretic.  HENT:     Head: Normocephalic and atraumatic.     Nose: Congestion present. No rhinorrhea.     Mouth/Throat:     Mouth: Mucous membranes are moist.  Eyes:     General: No scleral icterus.       Right eye: No discharge.        Left eye: No discharge.     Extraocular Movements: Extraocular movements intact.  Cardiovascular:     Rate and Rhythm: Normal rate and regular rhythm.     Heart sounds: Normal heart sounds. No murmur heard.    No friction rub. No gallop.  Pulmonary:     Effort: Pulmonary effort is normal. No respiratory distress.     Breath sounds: No stridor. No wheezing, rhonchi or rales.  Chest:     Chest wall: No tenderness.  Skin:    General: Skin is warm and dry.  Neurological:     General: No focal deficit present.     Mental Status: She is alert and oriented to person, place, and time.  Psychiatric:        Mood and Affect: Mood normal.        Behavior: Behavior normal.     Assessment and Plan :   PDMP not reviewed this encounter.  1. Acute viral syndrome   2. Screen for STD (sexually transmitted disease)   3. Unprotected sex   4. Mild persistent asthma, uncomplicated     Will manage for viral illness such as viral URI, viral syndrome, viral rhinitis, COVID-19. Recommended supportive care. Offered scripts for symptomatic relief. Testing is pending.  Patient would be a good candidate for Paxlovid should she test positive.  STI screen pending.  Counseled patient on potential  for adverse effects with medications prescribed/recommended today, ER and return-to-clinic precautions discussed, patient verbalized understanding.     Jaynee Eagles, PA-C 11/30/21 1326

## 2021-11-30 NOTE — ED Triage Notes (Signed)
Pt reports that she had back pains and was given Naproxen when she was seen back in September but still having it. Reports that she wants to be checked (STDs) to make sure everything is ok.   Pt also have cough that is congested for a few days.

## 2021-11-30 NOTE — Discharge Instructions (Addendum)

## 2021-12-01 LAB — HIV ANTIBODY (ROUTINE TESTING W REFLEX): HIV Screen 4th Generation wRfx: NONREACTIVE

## 2021-12-01 LAB — SARS CORONAVIRUS 2 (TAT 6-24 HRS): SARS Coronavirus 2: POSITIVE — AB

## 2021-12-01 LAB — RPR: RPR Ser Ql: NONREACTIVE

## 2021-12-04 LAB — CERVICOVAGINAL ANCILLARY ONLY
Chlamydia: NEGATIVE
Comment: NEGATIVE
Comment: NEGATIVE
Comment: NORMAL
Neisseria Gonorrhea: NEGATIVE
Trichomonas: NEGATIVE

## 2021-12-12 ENCOUNTER — Encounter (HOSPITAL_COMMUNITY): Payer: Self-pay | Admitting: Emergency Medicine

## 2021-12-12 ENCOUNTER — Ambulatory Visit (HOSPITAL_COMMUNITY)
Admission: EM | Admit: 2021-12-12 | Discharge: 2021-12-12 | Disposition: A | Discharge status: Home / Self Care | Payer: Medicaid Other | Attending: Emergency Medicine | Admitting: Emergency Medicine

## 2021-12-12 DIAGNOSIS — R109 Unspecified abdominal pain: Secondary | ICD-10-CM

## 2021-12-12 DIAGNOSIS — Z3202 Encounter for pregnancy test, result negative: Secondary | ICD-10-CM | POA: Diagnosis not present

## 2021-12-12 DIAGNOSIS — M545 Low back pain, unspecified: Secondary | ICD-10-CM | POA: Diagnosis not present

## 2021-12-12 LAB — POCT URINALYSIS DIPSTICK, ED / UC
Glucose, UA: NEGATIVE mg/dL
Hgb urine dipstick: NEGATIVE
Ketones, ur: NEGATIVE mg/dL
Leukocytes,Ua: NEGATIVE
Nitrite: NEGATIVE
Protein, ur: NEGATIVE mg/dL
Specific Gravity, Urine: 1.02 (ref 1.005–1.030)
Urobilinogen, UA: 2 mg/dL — ABNORMAL HIGH (ref 0.0–1.0)
pH: 7 (ref 5.0–8.0)

## 2021-12-12 LAB — POC URINE PREG, ED: Preg Test, Ur: NEGATIVE

## 2021-12-12 MED ORDER — CYCLOBENZAPRINE HCL 10 MG PO TABS: 10.0000 mg | ORAL_TABLET | Freq: Two times a day (BID) | ORAL | 0 refills | Status: AC | PRN

## 2021-12-12 MED ORDER — LIDOCAINE 5 % EX PTCH: 1.0000 | MEDICATED_PATCH | CUTANEOUS | 0 refills | Status: DC

## 2021-12-12 NOTE — Discharge Instructions (Addendum)
Continue the naproxen as needed  I recommend the muscle relaxer as well, every 12 hours as needed. If it make you drowsy take only before bed.  Apply lidocaine patch to the low back for 12 hours at at time   Please follow up with PCP or ob/gyn if symptoms persist

## 2021-12-12 NOTE — ED Provider Notes (Signed)
[facility]    CSN: [id number] Arrival date & time: 12/12/21  1507      History   Chief Complaint Chief Complaint  Patient presents with   Abdominal Pain   Back Pain   Covid Test    HPI Jaclyn Owens is a 27 y.o. female.  Presents with 1 week of low back pain On and off since her epidural 2 years ago Comes on randomly, sometimes with movement Denies new injury or trauma  Abdominal pain for 2 days, mild, kind of like cramps No vomiting or diarrhea. 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 Authorizing Provider  cyclobenzaprine (FLEXERIL) 10 MG tablet Take 1 tablet (10 mg total) by mouth 2 (two) times daily as needed for up to 5 days for muscle spasms. 12/12/21 12/17/21 Yes Rene Sizelove, PA-C  lidocaine (LIDODERM) 5 % Place 1 patch onto the skin daily. Remove & Discard patch within 12 hours 12/12/21  Yes Donie Moulton, Lurena Joiner, PA-C  Cetirizine HCl 10 MG CAPS Take 1 capsule (10 mg total) by mouth daily for 10 days. 12/03/19  12/13/19  Wieters, Hallie C, PA-C  levocetirizine (XYZAL) 5 MG tablet Take 1 tablet (5 mg total) by mouth every evening. 11/30/21   Wallis Bamberg, PA-C  naproxen (NAPROSYN) 375 MG tablet Take 1 tablet (375 mg total) by mouth 2 (two) times daily. 09/05/21   Raspet, Noberto Retort, PA-C    Family History Family History  Problem Relation Age of Onset   Asthma Mother    Diabetes Maternal Aunt    Cancer Maternal Grandmother     Social History Social History   Tobacco Use   Smoking status: Former    Types: Cigarettes, Cigars   Smokeless tobacco: Never  Vaping Use   Vaping Use: Never used  Substance Use Topics   Alcohol use: Yes    Comment: occasionally   Drug use: Yes    Types: Marijuana    Comment: MJ use 01-24-15     Allergies   Bee venom   Review of Systems Review of Systems  Gastrointestinal:  Positive for abdominal pain.  Musculoskeletal:  Positive for back pain.   Per HPI  Physical Exam Triage Vital Signs ED Triage Vitals  Enc Vitals Group     BP 12/12/21 1720 111/71  Pulse Rate 12/12/21 1720 99     Resp 12/12/21 1720 18     Temp 12/12/21 1720 98.5 F (36.9 C)     Temp Source 12/12/21 1720 Oral     SpO2 12/12/21 1720 98 %     Weight --      Height --      Head Circumference --      Peak Flow --      Pain Score 12/12/21 1723 7     Pain Loc --      Pain Edu? --      Excl. in [place]? --    No data found.  Updated Vital Signs BP 111/71 (BP Location: Right Arm)   Pulse 99   Temp 98.5 F (36.9 C) (Oral)   Resp 18   LMP 11/25/2021   SpO2 98%    Physical Exam Vitals and nursing note reviewed.  Constitutional:      General: She is not in acute distress.    Appearance: She is well-developed. She is not ill-appearing.  HENT:     Mouth/Throat:     Mouth: Mucous membranes are moist.     Pharynx: Oropharynx is clear.  Cardiovascular:     Rate and Rhythm: Normal rate and regular rhythm.     Heart sounds: Normal heart sounds.  Pulmonary:     Effort: Pulmonary  effort is normal.     Breath sounds: Normal breath sounds.  Abdominal:     Palpations: There is no mass.     Tenderness: There is no abdominal tenderness. There is no right CVA tenderness, left CVA tenderness, guarding or rebound.  Neurological:     Mental Status: She is alert and oriented to person, place, and time.     UC Treatments / Results  Labs (all labs ordered are listed, but only abnormal results are displayed) Labs Reviewed  POCT URINALYSIS DIPSTICK, ED / UC - Abnormal; Notable for the following components:      Result Value   Bilirubin Urine SMALL (*)    Urobilinogen, UA 2.0 (*)    All other components within normal limits  POC URINE PREG, ED    EKG  Radiology No results found.  Procedures Procedures  Medications Ordered in UC Medications - No data to display  Initial Impression / Assessment and Plan / UC Course  I have reviewed the triage vital signs and the nursing notes.  Pertinent labs & imaging results that were available during my care of the patient were reviewed by me and considered in my medical decision making (see chart for details).  Overall she is well appearing and in no acute distress Abdominal exam benign Urine unremarkable Urine pregnancy negative  No red flags with back pain Could be muscular vs related to epidural in the past vs body aches from covid symptoms last week Recommend continue naproxen as needed, can try muscle relaxer BID as needed Recommend lidocaine patch as well, especially if pain comes on randomly. She can apply when symptoms occur. Follow up with ob/gyn if symptoms persist Return precautions discussed. Patient agrees to plan  Final Clinical Impressions(s) / UC Diagnoses   Final diagnoses:  Acute bilateral low back pain without sciatica     Discharge Instructions      Continue the naproxen as needed  I recommend the muscle relaxer as well, every 12 hours as needed. If it make you drowsy take only before  bed.  Apply lidocaine patch to the low back for 12  hours at at ime   Please follow up with PCP or ob/gyn if symptoms persist     ED Prescriptions     Medication Sig Dispense Auth. Provider   cyclobenzaprine (FLEXERIL) 10 MG tablet Take 1 tablet (10 mg total) by mouth 2 (two) times daily as needed for up to 5 days for muscle spasms. 10 tablet Ira Busbin, PA-C   lidocaine (LIDODERM) 5 % Place 1 patch onto the skin daily. Remove & Discard patch within 12 hours 5 patch Carlisia Geno, Wells Guiles, PA-C      PDMP not reviewed this encounter.   Adelis Docter, Vernice Jefferson 12/12/21 1811

## 2021-12-12 NOTE — ED Triage Notes (Signed)
Pt reports back pain x 1 week and lower abdominal pain x 2/3 days. States she was given naproxen for back pain with no relief.   Also requesting covid test, no current symptoms.

## 2021-12-27 DIAGNOSIS — F3164 Bipolar disorder, current episode mixed, severe, with psychotic features: Secondary | ICD-10-CM | POA: Diagnosis not present

## 2022-01-05 DIAGNOSIS — F3164 Bipolar disorder, current episode mixed, severe, with psychotic features: Secondary | ICD-10-CM | POA: Diagnosis not present

## 2022-01-10 DIAGNOSIS — F3164 Bipolar disorder, current episode mixed, severe, with psychotic features: Secondary | ICD-10-CM | POA: Diagnosis not present

## 2022-01-30 ENCOUNTER — Ambulatory Visit
Admission: EM | Admit: 2022-01-30 | Discharge: 2022-01-30 | Disposition: A | Discharge status: Home / Self Care | Payer: Medicaid Other | Attending: Family Medicine | Admitting: Family Medicine

## 2022-01-30 DIAGNOSIS — Z113 Encounter for screening for infections with a predominantly sexual mode of transmission: Secondary | ICD-10-CM | POA: Insufficient documentation

## 2022-01-30 NOTE — ED Notes (Signed)
PT REFUSED NEED FOR PREGNANCY TEST-MD AWARE

## 2022-01-30 NOTE — ED Provider Notes (Signed)
[facility]    CSN: [id number] Arrival date & time: 01/30/22  1858      History   Chief Complaint Chief Complaint  Patient presents with   SEXUALLY TRANSMITTED DISEASE    HPI Jaclyn Owens is a 27 y.o. female.   HPI Here for STD screening.  She denies any vaginal discharge or itching.  She notes some bumps that come up sometimes on her inguinal area.  First she tells me it is on the right but then she tells me it is actually bilateral.  No fever or chills.  Last menstrual cycle was December 13.  She did decline to do a pregnancy test  Past Medical History:  Diagnosis Date   Asthma    HSV (herpes simplex virus) infection     Patient Active Problem List   Diagnosis Date Noted   Possible exposure to STD 10/09/2021   Indication for care in labor or delivery 02/16/2019   Gestational diabetes 12/31/2018   Short interval between pregnancies affecting pregnancy, antepartum 08/21/2018   Supervision of other normal pregnancy, antepartum 08/29/2017   HSV-2 infection complicating pregnancy 76/22/6333   Adjustment disorder with depressed mood 02/24/2017    Past Surgical History:  Procedure Laterality Date   ARM WOUND REPAIR / CLOSURE      OB History     Gravida  3   Para  3   Term  3   Preterm  0   AB  0   Living  3      SAB  0   IAB  0   Ectopic  0   Multiple  0   Live Births  3            Home Medications    Prior to Admission medications   Medication Sig Start Date End Date Taking? Authorizing Provider  FLUoxetine (PROZAC) 10 MG capsule Take 10 mg by mouth every morning. 01/16/22  Yes [provider]  Cetirizine HCl 10 MG CAPS Take 1 capsule (10 mg total) by mouth daily for 10 days. 12/03/19 12/13/19  Wieters, Hallie C, PA-C  levocetirizine (XYZAL) 5 MG tablet Take 1 tablet (5 mg total) by mouth every evening. 11/30/21   Jaynee Eagles, PA-C  lidocaine (LIDODERM) 5 % Place 1 patch onto the skin daily. Remove & Discard patch within  12 hours 12/12/21   Rising, Wells Guiles, PA-C  naproxen (NAPROSYN) 375 MG tablet Take 1 tablet (375 mg total) by mouth 2 (two) times daily. 09/05/21   Raspet, Derry Skill, PA-C    Family History Family History  Problem Relation Age of Onset   Asthma Mother    Diabetes Maternal Aunt    Cancer Maternal Grandmother     Social History Social History   Tobacco Use   Smoking status: Former    Types: Cigarettes, Cigars   Smokeless tobacco: Never  Vaping Use   Vaping Use: Every day  Substance Use Topics   Alcohol use: Not Currently   Drug use: Yes    Types: Marijuana     Allergies   Bee venom   Review of Systems Review of Systems   Physical Exam Triage Vital Signs ED Triage Vitals  Enc Vitals Group     BP 01/30/22 2014 114/73     Pulse Rate 01/30/22 2014 94     Resp 01/30/22 2014 18     Temp 01/30/22 2014 98.7 F (37.1 C)     Temp Source 01/30/22 2014 Oral  SpO2 01/30/22 2014 99 %     Weight --      Height --      Head Circumference --      Peak Flow --      Pain Score 01/30/22 2020 0     Pain Loc --      Pain Edu? --      Excl. in [place]? --    No data found.  Updated Vital Signs BP 114/73 (BP Location: Left Arm)   Pulse 94   Temp 98.7 F (37.1 C) (Oral)   Resp 18   LMP 01/10/2022   SpO2 99%   Visual Acuity Right Eye Distance:   Left Eye Distance:   Bilateral Distance:    Right Eye Near:   Left Eye Near:    Bilateral Near:     Physical Exam Vitals reviewed.  Constitutional:      General: She is not in acute distress.    Appearance: She is not ill-appearing, toxic-appearing or diaphoretic.  Abdominal:     Palpations: Abdomen is soft.     Tenderness: There is no abdominal tenderness.  Genitourinary:    Comments: I do not feel any abnormal subcu nodules in her right inguinal area Skin:    Coloration: Skin is not jaundiced or pale.  Neurological:     General: No focal deficit present.     Mental Status: She is alert.  Psychiatric:        Behavior:  Behavior normal.      UC Treatments / Results  Labs (all labs ordered are listed, but only abnormal results are displayed) Labs Reviewed  CERVICOVAGINAL ANCILLARY ONLY    EKG   Radiology No results found.  Procedures Procedures (including critical care time)  Medications Ordered in UC Medications - No data to display  Initial Impression / Assessment and Plan / UC Course  I have reviewed the triage vital signs and the nursing notes.  Pertinent labs & imaging results that were available during my care of the patient were reviewed by me and considered in my medical decision making (see chart for details).        Staff will notify her of any positives on the swab and treat per protocol.  I do not think any intervention needs to be done for the symptoms she noted in her inguinal areas Final Clinical Impressions(s) / UC Diagnoses   Final diagnoses:  Screen for STD (sexually transmitted disease)     Discharge Instructions      Staff will notify you of any positives on the swab    ED Prescriptions   None    PDMP not reviewed this encounter.   Barrett Henle, MD 01/30/22 2041

## 2022-01-30 NOTE — Discharge Instructions (Signed)
Staff will notify you of any positives on the swab

## 2022-01-30 NOTE — ED Triage Notes (Signed)
Pt requesting STD screening-NAD-steady gait 

## 2022-02-01 LAB — CERVICOVAGINAL ANCILLARY ONLY
Bacterial Vaginitis (gardnerella): POSITIVE — AB
Candida Glabrata: NEGATIVE
Candida Vaginitis: NEGATIVE
Chlamydia: NEGATIVE
Comment: NEGATIVE
Comment: NEGATIVE
Comment: NEGATIVE
Comment: NEGATIVE
Comment: NEGATIVE
Comment: NORMAL
Neisseria Gonorrhea: NEGATIVE
Trichomonas: NEGATIVE

## 2022-02-02 ENCOUNTER — Telehealth (HOSPITAL_COMMUNITY): Payer: Self-pay | Admitting: Emergency Medicine

## 2022-02-02 MED ORDER — METRONIDAZOLE 500 MG PO TABS: 500.0000 mg | ORAL_TABLET | Freq: Two times a day (BID) | ORAL | 0 refills | Status: DC

## 2022-02-13 DIAGNOSIS — F3164 Bipolar disorder, current episode mixed, severe, with psychotic features: Secondary | ICD-10-CM | POA: Diagnosis not present

## 2022-02-14 DIAGNOSIS — F3164 Bipolar disorder, current episode mixed, severe, with psychotic features: Secondary | ICD-10-CM | POA: Diagnosis not present

## 2022-04-13 ENCOUNTER — Ambulatory Visit
Admission: EM | Admit: 2022-04-13 | Discharge: 2022-04-13 | Disposition: A | Discharge status: Home / Self Care | Payer: Medicaid Other | Attending: Urgent Care | Admitting: Urgent Care

## 2022-04-13 DIAGNOSIS — B3731 Acute candidiasis of vulva and vagina: Secondary | ICD-10-CM | POA: Insufficient documentation

## 2022-04-13 DIAGNOSIS — N760 Acute vaginitis: Secondary | ICD-10-CM | POA: Insufficient documentation

## 2022-04-13 DIAGNOSIS — B9689 Other specified bacterial agents as the cause of diseases classified elsewhere: Secondary | ICD-10-CM | POA: Insufficient documentation

## 2022-04-13 MED ORDER — FLUCONAZOLE 150 MG PO TABS: 150.0000 mg | ORAL_TABLET | ORAL | 0 refills | Status: DC

## 2022-04-13 MED ORDER — METRONIDAZOLE 500 MG PO TABS: 500.0000 mg | ORAL_TABLET | Freq: Two times a day (BID) | ORAL | 0 refills | Status: DC

## 2022-04-13 NOTE — ED Provider Notes (Signed)
[facility]  Note:  This document was prepared using Systems analyst and may include unintentional dictation errors.  MRN: [id number] DOB: Apr 20, 1995  Subjective:   Jaclyn Owens is a 27 y.o. female presenting for 2 day history of vaginal itching.  Patient last had bacterial vaginosis 01/30/2022 and 10/09/2021.  Had HIV and syphilis testing November both of which were negative.  Had unprotected sex 3 days ago, wants STI testing.  Denies fever, n/v, abdominal pain, pelvic pain, rashes, dysuria, urinary frequency, hematuria, vaginal discharge.  LMP was last week.   No current facility-administered medications for this encounter.  Current Outpatient Medications:    Cetirizine HCl 10 MG CAPS, Take 1 capsule (10 mg total) by mouth daily for 10 days., Disp: 10 capsule, Rfl: 0   FLUoxetine (PROZAC) 10 MG capsule, Take 10 mg by mouth every morning., Disp: , Rfl:    levocetirizine (XYZAL) 5 MG tablet, Take 1 tablet (5 mg total) by mouth every evening., Disp: 30 tablet, Rfl: 0   lidocaine (LIDODERM) 5 %, Place 1 patch onto the skin daily. Remove & Discard patch within 12 hours, Disp: 5 patch, Rfl: 0   metroNIDAZOLE (FLAGYL) 500 MG tablet, Take 1 tablet (500 mg total) by mouth 2 (two) times daily., Disp: 14 tablet, Rfl: 0   naproxen (NAPROSYN) 375 MG tablet, Take 1 tablet (375 mg total) by mouth 2 (two) times daily., Disp: 20 tablet, Rfl: 0   Allergies  Allergen Reactions   Bee Venom Anaphylaxis    Past Medical History:  Diagnosis Date   Asthma    HSV (herpes simplex virus) infection      Past Surgical History:  Procedure Laterality Date   ARM WOUND REPAIR / CLOSURE      Family History  Problem Relation Age of Onset   Asthma Mother    Diabetes Maternal Aunt    Cancer Maternal Grandmother     Social History   Tobacco Use   Smoking status: Former    Types: Cigarettes, Cigars   Smokeless tobacco: Never  Vaping Use   Vaping Use: Every  day  Substance Use Topics   Alcohol use: Not Currently   Drug use: Yes    Types: Marijuana    ROS   Objective:   Vitals: BP 106/64 (BP Location: Left Arm)   Pulse 80   Temp 98.8 F (37.1 C) (Oral)   Resp 18   LMP 04/03/2022   SpO2 97%   Physical Exam Constitutional:      General: She is not in acute distress.    Appearance: Normal appearance. She is well-developed. She is not ill-appearing, toxic-appearing or diaphoretic.  HENT:     Head: Normocephalic and atraumatic.     Nose: Nose normal.     Mouth/Throat:     Mouth: Mucous membranes are moist.     Pharynx: Oropharynx is clear.  Eyes:     General: No scleral icterus.       Right eye: No discharge.        Left eye: No discharge.     Extraocular Movements: Extraocular movements intact.     Conjunctiva/sclera: Conjunctivae normal.  Cardiovascular:     Rate and Rhythm: Normal rate.  Pulmonary:     Effort: Pulmonary effort is normal.  Abdominal:     General: Bowel sounds are normal. There is no distension.     Palpations: Abdomen is soft. There is no mass.     Tenderness: There is  no abdominal tenderness. There is no right CVA tenderness, left CVA tenderness, guarding or rebound.  Skin:    General: Skin is warm and dry.  Neurological:     General: No focal deficit present.     Mental Status: She is alert and oriented to person, place, and time.  Psychiatric:        Mood and Affect: Mood normal.        Behavior: Behavior normal.        Thought Content: Thought content normal.        Judgment: Judgment normal.     Assessment and Plan :   PDMP not reviewed this encounter.  1. Bacterial vaginosis   2. Yeast vaginitis     Patient requested empiric treatment for BV and yeast infection.  Labs pending, will treat as appropriate otherwise.  Prescription sent for metronidazole and Diflucan to her pharmacy. Counseled patient on potential for adverse effects with medications prescribed/recommended today, ER and  return-to-clinic precautions discussed, patient verbalized understanding.    Jaynee Eagles, PA-C 04/13/22 0900

## 2022-04-13 NOTE — ED Triage Notes (Signed)
Pt c/o vaginal itching x 2 days-NAD-steady gait

## 2022-04-16 LAB — CERVICOVAGINAL ANCILLARY ONLY
Bacterial Vaginitis (gardnerella): POSITIVE — AB
Candida Glabrata: NEGATIVE
Candida Vaginitis: NEGATIVE
Chlamydia: NEGATIVE
Comment: NEGATIVE
Comment: NEGATIVE
Comment: NEGATIVE
Comment: NEGATIVE
Comment: NEGATIVE
Comment: NORMAL
Neisseria Gonorrhea: NEGATIVE
Trichomonas: NEGATIVE

## 2022-05-10 ENCOUNTER — Ambulatory Visit
Admission: EM | Admit: 2022-05-10 | Discharge: 2022-05-10 | Disposition: A | Discharge status: Home / Self Care | Payer: Medicaid Other | Attending: Nurse Practitioner | Admitting: Nurse Practitioner

## 2022-05-10 DIAGNOSIS — N898 Other specified noninflammatory disorders of vagina: Secondary | ICD-10-CM | POA: Diagnosis not present

## 2022-05-10 DIAGNOSIS — Z113 Encounter for screening for infections with a predominantly sexual mode of transmission: Secondary | ICD-10-CM | POA: Diagnosis not present

## 2022-05-10 DIAGNOSIS — R519 Headache, unspecified: Secondary | ICD-10-CM | POA: Diagnosis not present

## 2022-05-10 LAB — POCT URINALYSIS DIP (MANUAL ENTRY)
Bilirubin, UA: NEGATIVE
Blood, UA: NEGATIVE
Glucose, UA: NEGATIVE mg/dL
Ketones, POC UA: NEGATIVE mg/dL
Nitrite, UA: NEGATIVE
Protein Ur, POC: NEGATIVE mg/dL
Spec Grav, UA: >=1.03 — AB (ref 1.010–1.025)
Urobilinogen, UA: 0.2 E.U./dL
pH, UA: 6.5 (ref 5.0–8.0)

## 2022-05-10 MED ORDER — METRONIDAZOLE 500 MG PO TABS: 500.0000 mg | ORAL_TABLET | Freq: Two times a day (BID) | ORAL | 0 refills | Status: DC

## 2022-05-10 MED ORDER — ACETAMINOPHEN 325 MG PO TABS
650.0000 mg | ORAL_TABLET | Freq: Once | ORAL | Status: AC
  Administered 2022-05-10: 650 mg via ORAL

## 2022-05-10 NOTE — Discharge Instructions (Signed)
The clinic will contact you with results of the testing done today if positive Start Flagyl twice daily for 7 days Follow-up with your PCP if your symptoms do not improve Please go to the ER for any worsening symptoms

## 2022-05-10 NOTE — ED Triage Notes (Signed)
Patient presents to [facility] for std screening. Unprotected sexual intercourse on Sunday. Also complains of a HA since 2 days ago. Taking ibuprofen. Last dose 0600.

## 2022-05-10 NOTE — ED Provider Notes (Signed)
[facility]    CSN: [id number] Arrival date & time: 05/10/22  1432      History   Chief Complaint Chief Complaint  Patient presents with   SEXUALLY TRANSMITTED DISEASE   Headache    HPI Jaclyn Owens is a 27 y.o. female Modena JanskyZentz for evaluation of vaginal discharge.  Patient reports she had unprotected intercourse 4 days ago and 2 days after developed some malodorous vaginal discharge that is nonpruritic.  Denies any known STD exposure but would like screening.  Does have a history of BV and states the symptoms are similar.  Denies dysuria.  States she just got off her period and denies pregnancy.  No fevers, nausea/vomiting, flank pain.  In addition she reports a headache over the past 2 days that resolves with ibuprofen.  She last took some at 6 AM and is requesting Tylenol for headache in clinic.  Denies this is the worst headache of her life.  No dizziness or visual changes.  No other concerns at this time.   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 Authorizing Provider  metroNIDAZOLE (FLAGYL) 500 MG tablet Take 1  tablet (500 mg total) by mouth 2 (two) times daily. 05/10/22  Yes Radford PaxMayer, Jodi R, NP  Cetirizine HCl 10 MG CAPS Take 1 capsule (10 mg total) by mouth daily for 10 days. 12/03/19 12/13/19  Wieters, Hallie C, PA-C  fluconazole (DIFLUCAN) 150 MG tablet Take 1 tablet (150 mg total) by mouth once a week. 04/13/22   Wallis BambergMani, Mario, PA-C  FLUoxetine (PROZAC) 10 MG capsule Take 10 mg by mouth every morning. 01/16/22   [provider]  levocetirizine (XYZAL) 5 MG tablet Take 1 tablet (5 mg total) by mouth every evening. 11/30/21   Wallis BambergMani, Mario, PA-C  lidocaine (LIDODERM) 5 % Place 1 patch onto the skin daily. Remove & Discard patch within 12 hours 12/12/21   Rising, Lurena Joinerebecca, PA-C  naproxen (NAPROSYN) 375 MG tablet Take 1 tablet (375 mg total) by mouth 2 (two) times daily. 09/05/21   Raspet, Noberto RetortErin K, PA-C  QUEtiapine (SEROQUEL) 100 MG tablet Take 100 mg by mouth at bedtime.    [provider]    Family History Family History  Problem Relation Age of Onset   Asthma Mother    Diabetes Maternal Aunt    Cancer Maternal Grandmother  Social History Social History   Tobacco Use   Smoking status: Former    Types: Cigarettes, Cigars   Smokeless tobacco: Never  Vaping Use   Vaping Use: Former  Substance Use Topics   Alcohol use: Not Currently   Drug use: Yes    Types: Marijuana     Allergies   Bee venom   Review of Systems Review of Systems  Genitourinary:  Positive for vaginal discharge.  Neurological:  Positive for headaches.     Physical Exam Triage Vital Signs ED Triage Vitals  Enc Vitals Group     BP 05/10/22 1548 110/70     Pulse Rate 05/10/22 1548 80     Resp 05/10/22 1548 18     Temp 05/10/22 1548 98 F (36.7 C)     Temp Source 05/10/22 1548 Oral     SpO2 05/10/22 1548 97 %     Weight --      Height --      Head Circumference --      Peak Flow --      Pain Score 05/10/22 1546 10     Pain Loc --      Pain Edu? --      Excl. in [place]? --    No data  found.  Updated Vital Signs BP 110/70 (BP Location: Right Arm)   Pulse 80   Temp 98 F (36.7 C) (Oral)   Resp 18   LMP 05/03/2022 (Approximate)   SpO2 97%   Visual Acuity Right Eye Distance:   Left Eye Distance:   Bilateral Distance:    Right Eye Near:   Left Eye Near:    Bilateral Near:     Physical Exam Vitals and nursing note reviewed.  Constitutional:      Appearance: Normal appearance.  HENT:     Head: Normocephalic and atraumatic.  Eyes:     Extraocular Movements: Extraocular movements intact.     Pupils: Pupils are equal, round, and reactive to light.  Cardiovascular:     Rate and Rhythm: Normal rate.  Pulmonary:     Effort: Pulmonary effort is normal.  Abdominal:     Tenderness: There is no right CVA tenderness or left CVA tenderness.  Skin:    General: Skin is warm and dry.  Neurological:     General: No focal deficit present.     Mental Status: She is alert and oriented to person, place, and time.     GCS: GCS eye subscore is 4. GCS verbal subscore is 5. GCS motor subscore is 6.     Cranial Nerves: No facial asymmetry.     Motor: No weakness.  Psychiatric:        Mood and Affect: Mood normal.        Behavior: Behavior normal.      UC Treatments / Results  Labs (all labs ordered are listed, but only abnormal results are displayed) Labs Reviewed  POCT URINALYSIS DIP (MANUAL ENTRY) - Abnormal; Notable for the following components:      Result Value   Clarity, UA cloudy (*)    Spec Grav, UA >=1.030 (*)    Leukocytes, UA Small (1+) (*)    All other components within normal limits  URINE CULTURE  RPR  HIV ANTIBODY (ROUTINE TESTING W REFLEX)  CERVICOVAGINAL ANCILLARY ONLY    EKG   Radiology No results found.  Procedures Procedures (including critical care time)  Medications Ordered in UC Medications  acetaminophen (TYLENOL) tablet 650  mg (650 mg Oral Given 05/10/22 1610)    Initial Impression / Assessment and Plan / UC Course  I have  reviewed the triage vital signs and the nursing notes.  Pertinent labs & imaging results that were available during my care of the patient were reviewed by me and considered in my medical decision making (see chart for details).     Tylenol given in clinic for headache STD testing as ordered and will contact for any positive results UA with trace leuks, will culture as patient denies dysuria.  Patient declined hCG testing Start metronidazole for BV as symptoms are same as her previous infections PCP or GYN follow-up if symptoms do not improve ER precautions reviewed and patient verbalized understanding Final Clinical Impressions(s) / UC Diagnoses   Final diagnoses:  Vaginal discharge  Screening examination for STD (sexually transmitted disease)  Acute nonintractable headache, unspecified headache type     Discharge Instructions      The clinic will contact you with results of the testing done today if positive Start Flagyl twice daily for 7 days Follow-up with your PCP if your symptoms do not improve Please go to the ER for any worsening symptoms     ED Prescriptions     Medication Sig Dispense Auth. Provider   metroNIDAZOLE (FLAGYL) 500 MG tablet Take 1 tablet (500 mg total) by mouth 2 (two) times daily. 14 tablet Radford Pax, NP      PDMP not reviewed this encounter.   Radford Pax, NP 05/10/22 1620

## 2022-05-11 LAB — SYPHILIS: RPR W/REFLEX TO RPR TITER AND TREPONEMAL ANTIBODIES, TRADITIONAL SCREENING AND DIAGNOSIS ALGORITHM: RPR Ser Ql: NONREACTIVE

## 2022-05-11 LAB — CERVICOVAGINAL ANCILLARY ONLY
Bacterial Vaginitis (gardnerella): POSITIVE — AB
Candida Glabrata: NEGATIVE
Candida Vaginitis: NEGATIVE
Chlamydia: NEGATIVE
Comment: NEGATIVE
Comment: NEGATIVE
Comment: NEGATIVE
Comment: NEGATIVE
Comment: NEGATIVE
Comment: NORMAL
Neisseria Gonorrhea: NEGATIVE
Trichomonas: NEGATIVE

## 2022-05-11 LAB — HIV ANTIBODY (ROUTINE TESTING W REFLEX): HIV Screen 4th Generation wRfx: NONREACTIVE

## 2022-05-12 LAB — URINE CULTURE: Culture: <10000 — AB

## 2022-05-21 ENCOUNTER — Telehealth: Payer: Self-pay

## 2022-05-21 NOTE — Telephone Encounter (Signed)
error 

## 2022-06-16 ENCOUNTER — Emergency Department (EMERGENCY_DEPARTMENT_HOSPITAL)
Admission: EM | Admit: 2022-06-16 | Discharge: 2022-06-19 | Disposition: A | Discharge status: Home / Self Care | Payer: Medicaid Other | Source: Home / Self Care | Attending: Emergency Medicine | Admitting: Emergency Medicine

## 2022-06-16 ENCOUNTER — Other Ambulatory Visit: Payer: Self-pay

## 2022-06-16 DIAGNOSIS — F250 Schizoaffective disorder, bipolar type: Secondary | ICD-10-CM | POA: Diagnosis not present

## 2022-06-16 DIAGNOSIS — R000 Tachycardia, unspecified: Secondary | ICD-10-CM | POA: Insufficient documentation

## 2022-06-16 DIAGNOSIS — F29 Unspecified psychosis not due to a substance or known physiological condition: Secondary | ICD-10-CM | POA: Insufficient documentation

## 2022-06-16 DIAGNOSIS — Z1152 Encounter for screening for COVID-19: Secondary | ICD-10-CM | POA: Insufficient documentation

## 2022-06-16 LAB — COMPREHENSIVE METABOLIC PANEL
ALT: 23 U/L (ref 0–44)
AST: 36 U/L (ref 15–41)
Albumin: 5.1 g/dL — ABNORMAL HIGH (ref 3.5–5.0)
Alkaline Phosphatase: 16 U/L — ABNORMAL LOW (ref 38–126)
Anion gap: 12 (ref 5–15)
BUN: 11 mg/dL (ref 6–20)
CO2: 20 mmol/L — ABNORMAL LOW (ref 22–32)
Calcium: 9.1 mg/dL (ref 8.9–10.3)
Chloride: 106 mmol/L (ref 98–111)
Creatinine, Ser: 0.83 mg/dL (ref 0.44–1.00)
GFR, Estimated: >60 mL/min (ref >60)
Glucose, Bld: 99 mg/dL (ref 70–99)
Potassium: 3.4 mmol/L — ABNORMAL LOW (ref 3.5–5.1)
Sodium: 138 mmol/L (ref 135–145)
Total Bilirubin: 1.7 mg/dL — ABNORMAL HIGH (ref 0.3–1.2)
Total Protein: 7.9 g/dL (ref 6.5–8.1)

## 2022-06-16 LAB — CBC WITH DIFFERENTIAL/PLATELET
Abs Immature Granulocytes: 0.03 10*3/uL (ref 0.00–0.07)
Basophils Absolute: 0 10*3/uL (ref 0.0–0.1)
Basophils Relative: 0 %
Eosinophils Absolute: 0 10*3/uL (ref 0.0–0.5)
Eosinophils Relative: 0 %
HCT: 39.2 % (ref 36.0–46.0)
Hemoglobin: 13.3 g/dL (ref 12.0–15.0)
Immature Granulocytes: 0 %
Lymphocytes Relative: 17 %
Lymphs Abs: 1.8 10*3/uL (ref 0.7–4.0)
MCH: 30.3 pg (ref 26.0–34.0)
MCHC: 33.9 g/dL (ref 30.0–36.0)
MCV: 89.3 fL (ref 80.0–100.0)
Monocytes Absolute: 0.9 10*3/uL (ref 0.1–1.0)
Monocytes Relative: 8 %
Neutro Abs: 8.2 10*3/uL — ABNORMAL HIGH (ref 1.7–7.7)
Neutrophils Relative %: 75 %
Platelets: 228 10*3/uL (ref 150–400)
RBC: 4.39 MIL/uL (ref 3.87–5.11)
RDW: 12.9 % (ref 11.5–15.5)
WBC: 10.9 10*3/uL — ABNORMAL HIGH (ref 4.0–10.5)
nRBC: 0 % (ref 0.0–0.2)

## 2022-06-16 LAB — ETHANOL: Alcohol, Ethyl (B): <10 mg/dL (ref <10)

## 2022-06-16 LAB — URINE DRUG SCREEN, QUALITATIVE (ARMC ONLY)
Amphetamines, Ur Screen: NOT DETECTED
Barbiturates, Ur Screen: NOT DETECTED
Benzodiazepine, Ur Scrn: NOT DETECTED
Cannabinoid 50 Ng, Ur ~~LOC~~: POSITIVE — AB
Cocaine Metabolite,Ur ~~LOC~~: NOT DETECTED
MDMA (Ecstasy)Ur Screen: NOT DETECTED
Methadone Scn, Ur: NOT DETECTED
Opiate, Ur Screen: NOT DETECTED
Phencyclidine (PCP) Ur S: NOT DETECTED
Tricyclic, Ur Screen: NOT DETECTED

## 2022-06-16 LAB — ACETAMINOPHEN LEVEL: Acetaminophen (Tylenol), Serum: <10 ug/mL — ABNORMAL LOW (ref 10–30)

## 2022-06-16 LAB — SARS CORONAVIRUS 2 BY RT PCR: SARS Coronavirus 2 by RT PCR: NEGATIVE

## 2022-06-16 LAB — POC URINE PREG, ED: Preg Test, Ur: NEGATIVE

## 2022-06-16 LAB — SALICYLATE LEVEL: Salicylate Lvl: <7 mg/dL — ABNORMAL LOW (ref 7.0–30.0)

## 2022-06-16 MED ORDER — LORAZEPAM 2 MG/ML IJ SOLN
2.0000 mg | Freq: Once | INTRAMUSCULAR | Status: AC
  Administered 2022-06-16: 2 mg via INTRAMUSCULAR
  Filled 2022-06-16: qty 1

## 2022-06-16 MED ORDER — DROPERIDOL 2.5 MG/ML IJ SOLN
5.0000 mg | Freq: Once | INTRAMUSCULAR | Status: AC | PRN
  Administered 2022-06-16: 5 mg via INTRAMUSCULAR
  Filled 2022-06-16: qty 2

## 2022-06-16 MED ORDER — MIDAZOLAM HCL 2 MG/2ML IJ SOLN: 5.0000 mg | Freq: Once | INTRAMUSCULAR | Status: DC | PRN

## 2022-06-16 NOTE — ED Notes (Signed)
Pt reluctantly dressed out with this RN and other female Charity fundraiser. Pt accused Rns of having a plan to medicate her. Informed her that it is her choice to cooperate or get medicated because IVC protocol requires certain steps which include dressing out. Pt continues to talk tangentially.
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
## 2022-06-16 NOTE — ED Notes (Signed)
Single belongings bag taken to locked unit by [employer]

## 2022-06-16 NOTE — ED Notes (Signed)
Pt sleeping. 

## 2022-06-16 NOTE — ED Notes (Signed)
Pyxis out of versed. Pharmacy contacted

## 2022-06-16 NOTE — ED Notes (Signed)
Pt lunch is at bedside. Pt is asleep at this time.
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
## 2022-06-16 NOTE — BH Assessment (Signed)
TTS attempted to evaluate pt. However, TTS was unable to assess due to pt being sleep. Pt was give medication due to pt presenting psychotic and aggressive towards staff.   Dava Najjar, MA, [facility], [facility]  Therapeutic Triage Specialist

## 2022-06-16 NOTE — ED Notes (Signed)
Waiting on versed from pharmacy then will medicate pt who continues to escalate verbally. After pt is medicated will attempt bloodwork urine and EKG.

## 2022-06-17 NOTE — BH Assessment (Signed)
Patient is recommended for psych inpatient treatment. Patient has been referred to outside hospitals, per AC.  No bed offers at this time. Pending bed offer. 

## 2022-06-17 NOTE — ED Notes (Signed)
Pt grandmother updated on pt care and plan. Call grandmother Lazaro Arms with any new updates or any emergencies.

## 2022-06-17 NOTE — ED Notes (Signed)
Pt given PM snack.  

## 2022-06-17 NOTE — ED Notes (Signed)
Pt given breakfast tray

## 2022-06-17 NOTE — BH Assessment (Addendum)
Per [facility] AC Everardo Pacific), patient to be referred out of system.  Referral information for Psychiatric Hospitalization faxed to:  [facility] [phone]),  [place] [phone]--- [phone]--- [phone]--- [phone])  [address] [phone]),   [facility] [phone] -or- [phone]),   [facility] [phone]),  [place] [phone])  [facility] [phone]) No beds available at this time.

## 2022-06-17 NOTE — ED Notes (Signed)
Pt doesn't eat pork diet updated
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
## 2022-06-17 NOTE — ED Notes (Signed)
Pt given supper tray.

## 2022-06-17 NOTE — ED Notes (Signed)
Pt requesting paper and crayon; purple crayon and paper given at this time.

## 2022-06-17 NOTE — ED Notes (Signed)
Pt's mother called requesting information on pt. Secretary informed pt mother that per pt they are not to give out any information to mother.  This RN asked pt to confirm that she did not want to speak with mother or have any information relayed to her. Pt stated I do not want her to have any information and please do not tell her anything.

## 2022-06-17 NOTE — ED Notes (Signed)
Pt given lunch tray.

## 2022-06-17 NOTE — ED Notes (Signed)
TTS speaking with patient. 

## 2022-06-18 ENCOUNTER — Emergency Department: Payer: Medicaid Other

## 2022-06-18 MED ORDER — HYDROXYZINE HCL 25 MG PO TABS
50.0000 mg | ORAL_TABLET | Freq: Three times a day (TID) | ORAL | Status: DC | PRN
  Administered 2022-06-18: 50 mg via ORAL
  Filled 2022-06-18: qty 2

## 2022-06-18 MED ORDER — DIPHENHYDRAMINE HCL 50 MG/ML IJ SOLN: 50.0000 mg | Freq: Three times a day (TID) | INTRAMUSCULAR | Status: DC | PRN

## 2022-06-18 MED ORDER — HALOPERIDOL 5 MG PO TABS: 5.0000 mg | ORAL_TABLET | Freq: Three times a day (TID) | ORAL | Status: DC | PRN

## 2022-06-18 MED ORDER — LORAZEPAM 2 MG PO TABS: 2.0000 mg | ORAL_TABLET | Freq: Three times a day (TID) | ORAL | Status: DC | PRN

## 2022-06-18 MED ORDER — HALOPERIDOL LACTATE 5 MG/ML IJ SOLN: 5.0000 mg | Freq: Three times a day (TID) | INTRAMUSCULAR | Status: DC | PRN

## 2022-06-18 MED ORDER — DIPHENHYDRAMINE HCL 25 MG PO CAPS: 50.0000 mg | ORAL_CAPSULE | Freq: Three times a day (TID) | ORAL | Status: DC | PRN

## 2022-06-18 MED ORDER — LORAZEPAM 2 MG/ML IJ SOLN: 2.0000 mg | Freq: Three times a day (TID) | INTRAMUSCULAR | Status: DC | PRN

## 2022-06-18 NOTE — ED Notes (Addendum)
Pt seen drawing on doors of room but had no writing utensils present at this time. This nurse and ed tech checked on pt who was using toothpaste to write on the inside on one of her room doors. When asked but informed this nurse it was just a symbol for protection with the word protection written underneath. Pt informed that writing on hospital property was not tolerated but since toothpaste is easily removed pt permitted to keep symbol but asked to not draw anything else. Pt verbalized understanding.

## 2022-06-18 NOTE — ED Notes (Addendum)
Pt given breakfast tray. Pt requesting orange juice instead of milk. When NT handed patient breakfast tray, pt states "that's crazy how you can have your wrist wrapped up and I can't". This RN explained unit rules and that the doctor did not see any medical benefit from wrapping pt's arm, pt rolls eyes in response.

## 2022-06-18 NOTE — ED Notes (Addendum)
Pt demanding to "press charges on the officers that brought her in and everyone mistreating me".

## 2022-06-18 NOTE — ED Notes (Signed)
Pt requested orange juice instead of milk with breakfast when given tray stating "I don't like milk". Pt also complaining that this tech has a bandage wrap on hand and she is not allowed to. RN explained why pt is unable to have a wrap in the psychiatric area of the ED. When this tech brought pt the orange juice, pt stated "I'm good now, I don't want it anymore. I drank the milk. You can take my trash though." Pt rolling eyes and visibly irritated.

## 2022-06-18 NOTE — ED Notes (Signed)
Lunch provided.

## 2022-06-18 NOTE — ED Notes (Signed)
Pt asking for the phone to call her children. Pt asked if she knows the number and pt states yes. Pt given the phone and told she had 10 minutes.
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
## 2022-06-18 NOTE — ED Notes (Signed)
Recommendations for Services/Supports/Treatments: Consulted with Ene A. NP, who determined pt. meets inpatient criteria
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
## 2022-06-18 NOTE — ED Notes (Signed)
Pt given hospital provided dinner tray. 

## 2022-06-18 NOTE — ED Notes (Addendum)
Pt given the phone to try to call family again. Family did not answer the phone. Pt asking where her children are, this Clinical research associate informed pt that I do not know where are they are. Pt asked to speak to RN, Sue Lush.

## 2022-06-18 NOTE — ED Notes (Signed)
This Clinical research associate and Environmental education officer notified patient on where children are currently placed at this time. Patient informed children are currently with paternal grandmother. Patient was visually upset but held composure very well during the news. Patient is tearful after she was told. Patient was offered po medication for anxiety. Patient currently declines.

## 2022-06-18 NOTE — ED Notes (Signed)
Pt given phone to call her mom. Pt returned to nurses station to have this nurse speak to her grandmother for updates and to express concerns regarding pt speaking with her mother due to potential for pt becoming agitated.

## 2022-06-18 NOTE — ED Notes (Signed)
Pt to nurses station and states that the phone number she keeps calling to speak to her grandmother is not working. Pt states the voicemail does not pick up and music plays instead. Pt appears anxious and asking this nurse to try calling from another phone. Tried calling provided number from ascom with no answer and also heard music playing instead of voicemail picking up. Acknowledged by patient.
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
## 2022-06-18 NOTE — ED Notes (Addendum)
Pt requesting to speak to psychiatrist at this time. Given shower supplies as pt requested shower earlier. Will message psych team to notify of patient request. Pt does not appear to be responding to internal stimuli at this time. Pt alert and responding appropriately to questions.

## 2022-06-19 ENCOUNTER — Encounter: Payer: Self-pay | Admitting: Psychiatry

## 2022-06-19 ENCOUNTER — Inpatient Hospital Stay
Admission: AD | Admit: 2022-06-19 | Discharge: 2022-06-21 | DRG: 885 | Disposition: A | Discharge status: Other Acute Inpatient Hospital | Payer: Medicaid Other | Source: Intra-hospital | Attending: Psychiatry | Admitting: Psychiatry

## 2022-06-19 ENCOUNTER — Other Ambulatory Visit: Payer: Self-pay

## 2022-06-19 DIAGNOSIS — Z20822 Contact with and (suspected) exposure to covid-19: Secondary | ICD-10-CM | POA: Diagnosis present

## 2022-06-19 DIAGNOSIS — S50811A Abrasion of right forearm, initial encounter: Secondary | ICD-10-CM | POA: Diagnosis present

## 2022-06-19 DIAGNOSIS — F312 Bipolar disorder, current episode manic severe with psychotic features: Secondary | ICD-10-CM | POA: Diagnosis present

## 2022-06-19 DIAGNOSIS — F4323 Adjustment disorder with mixed anxiety and depressed mood: Secondary | ICD-10-CM | POA: Diagnosis present

## 2022-06-19 DIAGNOSIS — Z87891 Personal history of nicotine dependence: Secondary | ICD-10-CM

## 2022-06-19 MED ORDER — HALOPERIDOL 5 MG PO TABS: 5.0000 mg | ORAL_TABLET | Freq: Three times a day (TID) | ORAL | Status: AC | PRN

## 2022-06-19 MED ORDER — LORAZEPAM 2 MG/ML IJ SOLN: 2.0000 mg | Freq: Three times a day (TID) | INTRAMUSCULAR | Status: AC | PRN

## 2022-06-19 MED ORDER — OLANZAPINE 10 MG PO TBDP
10.0000 mg | ORAL_TABLET | Freq: Two times a day (BID) | ORAL | Status: DC
  Filled 2022-06-19 (×2): qty 1

## 2022-06-19 MED ORDER — LORAZEPAM 1 MG PO TABS: 1.0000 mg | ORAL_TABLET | ORAL | Status: DC | PRN

## 2022-06-19 MED ORDER — ZIPRASIDONE MESYLATE 20 MG IM SOLR: 20.0000 mg | INTRAMUSCULAR | Status: DC | PRN

## 2022-06-19 MED ORDER — ALUM & MAG HYDROXIDE-SIMETH 200-200-20 MG/5ML PO SUSP: 30.0000 mL | ORAL | Status: DC | PRN

## 2022-06-19 MED ORDER — ACETAMINOPHEN 325 MG PO TABS: 650.0000 mg | ORAL_TABLET | Freq: Four times a day (QID) | ORAL | Status: DC | PRN

## 2022-06-19 MED ORDER — OLANZAPINE 10 MG PO TBDP: 10.0000 mg | ORAL_TABLET | Freq: Three times a day (TID) | ORAL | Status: DC | PRN

## 2022-06-19 MED ORDER — DIPHENHYDRAMINE HCL 50 MG/ML IJ SOLN: 50.0000 mg | Freq: Three times a day (TID) | INTRAMUSCULAR | Status: AC | PRN

## 2022-06-19 MED ORDER — LORAZEPAM 2 MG PO TABS: 2.0000 mg | ORAL_TABLET | Freq: Three times a day (TID) | ORAL | Status: AC | PRN

## 2022-06-19 MED ORDER — DIPHENHYDRAMINE HCL 25 MG PO CAPS: 50.0000 mg | ORAL_CAPSULE | Freq: Three times a day (TID) | ORAL | Status: AC | PRN

## 2022-06-19 MED ORDER — HALOPERIDOL LACTATE 5 MG/ML IJ SOLN: 5.0000 mg | Freq: Three times a day (TID) | INTRAMUSCULAR | Status: AC | PRN

## 2022-06-19 MED ORDER — TRAZODONE HCL 50 MG PO TABS: 50.0000 mg | ORAL_TABLET | Freq: Every evening | ORAL | Status: DC | PRN

## 2022-06-19 MED ORDER — MAGNESIUM HYDROXIDE 400 MG/5ML PO SUSP: 30.0000 mL | Freq: Every day | ORAL | Status: DC | PRN

## 2022-06-19 MED ORDER — HYDROXYZINE HCL 50 MG PO TABS
50.0000 mg | ORAL_TABLET | Freq: Three times a day (TID) | ORAL | Status: DC | PRN
  Administered 2022-06-19: 50 mg via ORAL
  Filled 2022-06-19: qty 1

## 2022-06-19 NOTE — ED Notes (Signed)
BREAKFAST TRAY GIVEN WITH APPLE JUICE
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
## 2022-06-19 NOTE — ED Notes (Signed)
SHOWER SUPPLIES GIVEN.  PT IS CURRENTLY TAKING A SHOWER.  °

## 2022-06-19 NOTE — [facility] Suicide Risk Assessment (Signed)
[facility] Admission Suicide Risk Assessment   Nursing information obtained from:  Patient Demographic factors:  Low socioeconomic status Current Mental Status:  NA Loss Factors:  NA Historical Factors:  Prior suicide attempts (Hx of medications OD in the past) Risk Reduction Factors:  Sense of responsibility to family, Responsible for children under 26 years of age  Total Time spent with patient: 45 minutes Principal Problem: Bipolar affective disorder, manic, severe, with psychotic behavior (HCC) Diagnosis:  Principal Problem:   Bipolar affective disorder, manic, severe, with psychotic behavior (HCC)  Subjective Data: "I am going to press charges".  27 year old woman brought to the hospital under involuntary commitment after being picked up by police with bizarre behavior.  Behavior extremely typical of manic episode.  Disorganized hostile threatening.  No report of any suicidal ideation or suicidal threats.  Continued Clinical Symptoms:  Alcohol Use Disorder Identification Test Final Score (AUDIT): 0 The "Alcohol Use Disorders Identification Test", Guidelines for Use in Primary Care, Second Edition.  World Science writer [facility]). Score between 0-7:  no or low risk or alcohol related problems. Score between 8-15:  moderate risk of alcohol related problems. Score between 16-19:  high risk of alcohol related problems. Score 20 or above:  warrants further diagnostic evaluation for alcohol dependence and treatment.   CLINICAL FACTORS:   Bipolar Disorder:   Mixed State   Musculoskeletal: Strength & Muscle Tone: within normal limits Gait & Station: normal Patient leans: N/A  Psychiatric Specialty Exam:  Presentation  General Appearance: No data recorded Eye Contact:No data recorded Speech:No data recorded Speech Volume:No data recorded Handedness:No data recorded  Mood and Affect  Mood:No data recorded Affect:No data recorded  Thought Process  Thought Processes:No data  recorded Descriptions of Associations:No data recorded Orientation:No data recorded Thought Content:No data recorded History of Schizophrenia/Schizoaffective disorder:No  Duration of Psychotic Symptoms:Less than six months  Hallucinations:No data recorded Ideas of Reference:No data recorded Suicidal Thoughts:No data recorded Homicidal Thoughts:No data recorded  Sensorium  Memory:No data recorded Judgment:No data recorded Insight:No data recorded  Executive Functions  Concentration:No data recorded Attention Span:No data recorded Recall:No data recorded Fund of Knowledge:No data recorded Language:No data recorded  Psychomotor Activity  Psychomotor Activity:No data recorded  Assets  Assets:No data recorded  Sleep  Sleep:No data recorded   Physical Exam: Physical Exam Vitals and nursing note reviewed.  Constitutional:      Appearance: Normal appearance.  HENT:     Head: Normocephalic and atraumatic.     Mouth/Throat:     Pharynx: Oropharynx is clear.  Eyes:     Pupils: Pupils are equal, round, and reactive to light.  Cardiovascular:     Rate and Rhythm: Normal rate and regular rhythm.  Pulmonary:     Effort: Pulmonary effort is normal.     Breath sounds: Normal breath sounds.  Abdominal:     General: Abdomen is flat.     Palpations: Abdomen is soft.  Musculoskeletal:        General: Normal range of motion.  Skin:    General: Skin is warm and dry.  Neurological:     General: No focal deficit present.     Mental Status: She is alert. Mental status is at baseline.  Psychiatric:        Attention and Perception: She is inattentive.        Mood and Affect: Affect is labile and inappropriate.        Speech: Speech is rapid and pressured and tangential.  Behavior: Behavior is agitated and aggressive.        Thought Content: Thought content is paranoid and delusional.        Cognition and Memory: Cognition is impaired. Memory is impaired.        Judgment:  Judgment is inappropriate.    Review of Systems  Constitutional: Negative.   HENT: Negative.    Eyes: Negative.   Respiratory: Negative.    Cardiovascular: Negative.   Gastrointestinal: Negative.   Musculoskeletal: Negative.   Skin: Negative.   Neurological: Negative.   Psychiatric/Behavioral: Negative.     Blood pressure 137/85, pulse (!) 104, temperature 98.9 F (37.2 C), temperature source Oral, resp. rate 18, height 5\' 7"  (1.702 m), weight 54.4 kg, SpO2 100 %. Body mass index is 18.79 kg/m.   COGNITIVE FEATURES THAT CONTRIBUTE TO RISK:  Thought constriction (tunnel vision)    SUICIDE RISK:   Minimal: No identifiable suicidal ideation.  Patients presenting with no risk factors but with morbid ruminations; may be classified as minimal risk based on the severity of the depressive symptoms  PLAN OF CARE: Continue 15-minute checks for now.  Patient is currently agitated and uncooperative.  Make sure medications in place as needed for agitation.  Try and work with her to see if we can convince her to start medication for current episode.  Ongoing assessment of dangerousness prior to discharge  I certify that inpatient services furnished can reasonably be expected to improve the patient's condition.   Mordecai Rasmussen, MD 06/19/2022, 4:45 PM

## 2022-06-19 NOTE — Group Note (Signed)
Date:  06/19/2022 Time:  4:20 PM  Group Topic/Focus:  Music therapy/outside time     Participation Level:  Active  Participation Quality:  Appropriate  Affect:  Appropriate  Cognitive:  Alert and Appropriate  Insight: Appropriate  Engagement in Group:  Engaged  Modes of Intervention:  Activity  Additional Comments:    Doug Sou 06/19/2022, 4:20 PM

## 2022-06-19 NOTE — Group Note (Signed)
LCSW Group Therapy Note  Group Date: 06/19/2022 Start Time: 1300 End Time: 1400   Type of Therapy and Topic:  Group Therapy - How To Cope with Nervousness about Discharge   Participation Level:  Did Not Attend   Description of Group This process group involved identification of patients' feelings about discharge. Some of them are scheduled to be discharged soon, while others are new admissions, but each of them was asked to share thoughts and feelings surrounding discharge from the hospital. One common theme was that they are excited at the prospect of going home, while another was that many of them are apprehensive about sharing why they were hospitalized. Patients were given the opportunity to discuss these feelings with their peers in preparation for discharge.  Therapeutic Goals  Patient will identify their overall feelings about pending discharge. Patient will think about how they might proactively address issues that they believe will once again arise once they get home (i.e. with parents). Patients will participate in discussion about having hope for change.   Summary of Patient Progress:   Patient did not attend group despite encouraged participation.    Therapeutic Modalities Cognitive Behavioral Therapy   Raziyah Vanvleck W Jearline Hirschhorn, LCSWA 06/19/2022  2:21 PM   

## 2022-06-19 NOTE — Progress Notes (Signed)
Pt is hyper-religious, manipulative, paranoid, and grandiose. Pt has stated multiple times that she will press charges if someone comes into her room or if they were going to force med or being not treated well. Pt stated that her kids need to stay with Mamie Nick (mother) so that DSS does not come take them. Pt stated that she was going to get out of here and that there is nothing wrong with her. Pt says she will not take any medications. Pt has already refused her evening dose. Pt did not fill out her menu and stated "I need to watch them cook my food because I am on a diet and if they put something in it." Pt states, "I have been good, I am loving and kind and I am not gonna cause any drama." Pt then stated "I will press charges if they try and give me meds."

## 2022-06-19 NOTE — ED Notes (Signed)
SNACKS GIVEN W/ WATER

## 2022-06-19 NOTE — BH Assessment (Signed)
Writer spoke to rep at [facility] [phone] and informed him that patient no longer needed admission. Rep verbalized understanding.

## 2022-06-19 NOTE — BH Assessment (Signed)
PLEASE DISREGARD PREVIOUS ADMISSION TO [facility]!!   Patient is to be admitted to [facility] by Dr. Toni Amend today 06/19/22.  Attending Physician will be Dr.  Toni Amend .   Patient has been assigned to room 307, by [facility] Charge Nurse Wylene Men.    ER staff is aware of the admission: Fleet Contras, ER Secretary   Dr. Derrill Kay, ER MD  Sue Lush, Patient's Nurse  Rob, Patient Access.

## 2022-06-19 NOTE — BH Assessment (Signed)
Patient has been accepted to [facility] on tomm 06/20/22 after 8:00am. Patient was not assigned to a room. Accepting physician is Dr. Loni Beckwith.  Call report to [phone].  Representative was [employer].   ER Staff is aware of it:  Misty Stanley, ER Secretary  Dr. Derrill Kay, ER MD  Sue Lush, Patient's Nurse

## 2022-06-19 NOTE — BH Assessment (Signed)
Referral information for Psychiatric Hospitalization RE-faxed to:   [facility] [phone]),   [address] [phone]),    [facility] [phone] -or- [phone]),    [facility] [phone])   [facility] [phone])  [facility] [phone]),

## 2022-06-19 NOTE — Tx Team (Signed)
Initial Treatment Plan 06/19/2022 2:56 PM Sindy Guadeloupe RUE:[id number]    PATIENT STRESSORS: Legal issue   Marital or family conflict     PATIENT STRENGTHS: Ability for insight  Average or above average intelligence  Religious Affiliation    PATIENT IDENTIFIED PROBLEMS: Ability to trust others and care for her dependents                     DISCHARGE CRITERIA:  Ability to meet basic life and health needs  PRELIMINARY DISCHARGE PLAN: Outpatient therapy  PATIENT/FAMILY INVOLVEMENT: This treatment plan has been presented to and reviewed with the patient, Jaclyn Owens, The patient and family have been given the opportunity to ask questions and make suggestions.  Annitta Jersey, RN 06/19/2022, 2:56 PM

## 2022-06-19 NOTE — ED Notes (Signed)
ivc/pending psych inpatient admission.Marland Kitchen

## 2022-06-19 NOTE — Progress Notes (Signed)
Patient presents with paranoia and delusions of grandeur. Patient is intrusive with staff. Pt is redirectable with staff. Pt didn't have any medication scheduled tonight and stated to this writer that she doesn't need anything to help her sleep tonight. Pt given education, support, and encouragement to be active in her treatment plan. Pt being monitored Q 15 minutes for safety per unit protocol, remains safe on the unit

## 2022-06-19 NOTE — Progress Notes (Signed)
Pt transferred from ED at about 12:10 Fully ambulatory on arrival,A&O X4, calm, denied any new pain on admission, pt with significant paranoia, reports that she thinks people have been following her around, does not trust authorities and even family members, denied any suicidal or homicidal ideations currently, is cooperative with admission assessments,skin intact, denied hearing voices or seeing anything unusual, received a prn hydroxyzine for anxiety as documented. Vital signs wnl. She is outside in the courtyard with peers at this time, she is being monitored as ordered.

## 2022-06-20 DIAGNOSIS — F312 Bipolar disorder, current episode manic severe with psychotic features: Secondary | ICD-10-CM | POA: Diagnosis not present

## 2022-06-20 MED ORDER — OLANZAPINE 10 MG IM SOLR: 15.0000 mg | Freq: Every evening | INTRAMUSCULAR | Status: DC | PRN

## 2022-06-20 MED ORDER — FLUOXETINE HCL 10 MG PO CAPS
10.0000 mg | ORAL_CAPSULE | Freq: Every day | ORAL | Status: DC
  Administered 2022-06-20 – 2022-06-21 (×2): 10 mg via ORAL
  Filled 2022-06-20 (×2): qty 1

## 2022-06-20 MED ORDER — QUETIAPINE FUMARATE 100 MG PO TABS
100.0000 mg | ORAL_TABLET | Freq: Every day | ORAL | Status: DC
  Administered 2022-06-20: 100 mg via ORAL
  Filled 2022-06-20: qty 1

## 2022-06-20 NOTE — Progress Notes (Signed)
Patient presents with paranoia and delusions of grandeur. Patient is intrusive with staff. Pt is redirectable with staff. Pt compliant with medication administration per MD orders. Pt given education, support, and encouragement to be active in her treatment plan. Pt being monitored Q 15 minutes for safety per unit protocol, remains safe on the unit

## 2022-06-20 NOTE — Progress Notes (Signed)
Patient compliant with medication administration per MD orders. Pt was observed afterworld to make sure she didn't cheek the medication or purge. As far as this Clinical research associate and staff can tell she did take the medication. Check MAR

## 2022-06-20 NOTE — Group Note (Unsigned)
Date:  06/20/2022 Time:  6:22 PM  Group Topic/Focus:  Goals Group:   The focus of this group is to help patients establish daily goals to achieve during treatment and discuss how the patient can incorporate goal setting into their daily lives to aide in recovery.  Community Group     Participation Level:  {BHH PARTICIPATION LEVEL:22264}  Participation Quality:  {BHH PARTICIPATION QUALITY:22265}  Affect:  {BHH AFFECT:22266}  Cognitive:  {BHH COGNITIVE:22267}  Insight: {BHH Insight2:20797}  Engagement in Group:  {BHH ENGAGEMENT IN GROUP:22268}  Modes of Intervention:  {BHH MODES OF INTERVENTION:22269}  Additional Comments:  ***  Jaclyn Owens 06/20/2022, 6:22 PM  

## 2022-06-20 NOTE — Progress Notes (Signed)
[facility] MD Progress Note  06/20/2022 9:59 AM Jaclyn Owens  MRN:  [id number] Subjective: Patient seen and chart reviewed.  Patient attended treatment team as well.  27 year old woman who as it turns out does have a past history of a diagnosis of bipolar disorder.  Someone must have brought in some paperwork for her from previous treatment because she had some printouts with her today in which she showed [place] that [facility] had diagnosed her with bipolar disorder and that she had previously been on fluoxetine and quetiapine.  Patient remains hyperverbal, hyper religious, agitated and unable to have a reasonable conversation because she will not stop talking long enough to listen to even one sentence from another person.  In between her prayers and threats during treatment team she stated that she wanted to be back on her fluoxetine.  She seemed very ambivalent about whether she would take the Seroquel.  She refused to listen to my discussion of her medication.  She remains highly intrusive and needs redirection to prevent her from being disruptive on the ward. Principal Problem: Bipolar affective disorder, manic, severe, with psychotic behavior (HCC) Diagnosis: Principal Problem:   Bipolar affective disorder, manic, severe, with psychotic behavior (HCC)  Total Time spent with patient: 30 minutes  Past Psychiatric History: Past history evidently of bipolar depression with evidence of treatment with Seroquel and fluoxetine.  Past Medical History:  Past Medical History:  Diagnosis Date   Asthma    HSV (herpes simplex virus) infection     Past Surgical History:  Procedure Laterality Date   ARM WOUND REPAIR / CLOSURE     Family History:  Family History  Problem Relation Age of Onset   Asthma Mother    Diabetes Maternal Aunt    Cancer Maternal Grandmother    Family Psychiatric  History: See previous Social History:  Social History   Substance and Sexual Activity  Alcohol Use Not Currently    Comment: UTA, pt is not cooperative with triage questions 06/16/22     Social History   Substance and Sexual Activity  Drug Use Yes   Types: Marijuana    Social History   Socioeconomic History   Marital status: Single    Spouse name: Not on file   Number of children: 1   Years of education: 13   Highest education level: High school graduate  Occupational History   Not on file  Tobacco Use   Smoking status: Former    Types: Cigarettes, Cigars   Smokeless tobacco: Never  Vaping Use   Vaping Use: Former  Substance and Sexual Activity   Alcohol use: Not Currently    Comment: UTA, pt is not cooperative with triage questions 06/16/22   Drug use: Yes    Types: Marijuana   Sexual activity: Yes    Birth control/protection: None  Other Topics Concern   Not on file  Social History Narrative   Not on file   Social Determinants of Health   Financial Resource Strain: Low Risk  (01/10/2018)   Overall Financial Resource Strain (CARDIA)    Difficulty of Paying Living Expenses: Not very hard  Food Insecurity: No Food Insecurity (06/19/2022)   Hunger Vital Sign    Worried About Running Out of Food in the Last Year: Never true    Ran Out of Food in the Last Year: Never true  Transportation Needs: No Transportation Needs (06/19/2022)   PRAPARE - Transportation    Lack of Transportation (Medical): No    Lack of  Transportation (Non-Medical): No  Physical Activity: Inactive (01/10/2018)   Exercise Vital Sign    Days of Exercise per Week: 0 days    Minutes of Exercise per Session: 0 min  Stress: No Stress Concern Present (01/10/2018)   [employer] of Occupational Health - Occupational Stress Questionnaire    Feeling of Stress : Not at all  Social Connections: Somewhat Isolated (01/10/2018)   Social Connection and Isolation Panel [NHANES]    Frequency of Communication with Friends and Family: More than three times a week    Frequency of Social Gatherings with Friends and Family:  More than three times a week    Attends Religious Services: More than 4 times per year    Active Member of [employer] or Organizations: No    Attends Engineer, structural: Never    Marital Status: Never married   Additional Social History:                         Sleep: Fair  Appetite:  Fair  Current Medications: Current Facility-Administered Medications  Medication Dose Route Frequency Provider Last Rate Last Admin   acetaminophen (TYLENOL) tablet 650 mg  650 mg Oral Q6H PRN Bennett, Christal H, NP       alum & mag hydroxide-simeth (MAALOX/MYLANTA) 200-200-20 MG/5ML suspension 30 mL  30 mL Oral Q4H PRN Bennett, Christal H, NP       diphenhydrAMINE (BENADRYL) capsule 50 mg  50 mg Oral TID PRN Bennett, Christal H, NP       Or   diphenhydrAMINE (BENADRYL) injection 50 mg  50 mg Intramuscular TID PRN Bennett, Christal H, NP       FLUoxetine (PROZAC) capsule 10 mg  10 mg Oral Daily Casha Estupinan T, MD       haloperidol (HALDOL) tablet 5 mg  5 mg Oral TID PRN Bennett, Christal H, NP       Or   haloperidol lactate (HALDOL) injection 5 mg  5 mg Intramuscular TID PRN Bennett, Christal H, NP       hydrOXYzine (ATARAX) tablet 50 mg  50 mg Oral TID PRN Bennett, Christal H, NP   50 mg at 06/19/22 1414   LORazepam (ATIVAN) tablet 2 mg  2 mg Oral TID PRN Bennett, Christal H, NP       Or   LORazepam (ATIVAN) injection 2 mg  2 mg Intramuscular TID PRN Bennett, Christal H, NP       OLANZapine zydis (ZYPREXA) disintegrating tablet 10 mg  10 mg Oral Q8H PRN Culver Feighner, Jackquline Denmark, MD       And   LORazepam (ATIVAN) tablet 1 mg  1 mg Oral PRN Minda Faas, Jackquline Denmark, MD       And   ziprasidone (GEODON) injection 20 mg  20 mg Intramuscular PRN Mychael Smock, Jackquline Denmark, MD       magnesium hydroxide (MILK OF MAGNESIA) suspension 30 mL  30 mL Oral Daily PRN Bennett, Christal H, NP       OLANZapine zydis (ZYPREXA) disintegrating tablet 10 mg  10 mg Oral BID Anastasha Ortez T, MD       QUEtiapine (SEROQUEL) tablet 100  mg  100 mg Oral QHS Twyla Dais T, MD       traZODone (DESYREL) tablet 50 mg  50 mg Oral QHS PRN Bennett, Christal H, NP        Lab Results: No results found for this or any previous visit (from the past 48  hour(s)).  Blood Alcohol level:  Lab Results  Component Value Date   ETH <10 06/16/2022   ETH <10 02/24/2017    Metabolic Disorder Labs: Lab Results  Component Value Date   HGBA1C 5.0 08/29/2017   No results found for: "PROLACTIN" No results found for: "CHOL", "TRIG", "HDL", "CHOLHDL", "VLDL", "LDLCALC"  Physical Findings: AIMS:  , ,  ,  ,    CIWA:    COWS:     Musculoskeletal: Strength & Muscle Tone: within normal limits Gait & Station: normal Patient leans: N/A  Psychiatric Specialty Exam:  Presentation  General Appearance: No data recorded Eye Contact:No data recorded Speech:No data recorded Speech Volume:No data recorded Handedness:No data recorded  Mood and Affect  Mood:No data recorded Affect:No data recorded  Thought Process  Thought Processes:No data recorded Descriptions of Associations:No data recorded Orientation:No data recorded Thought Content:No data recorded History of Schizophrenia/Schizoaffective disorder:No  Duration of Psychotic Symptoms:Less than six months  Hallucinations:No data recorded Ideas of Reference:No data recorded Suicidal Thoughts:No data recorded Homicidal Thoughts:No data recorded  Sensorium  Memory:No data recorded Judgment:No data recorded Insight:No data recorded  Executive Functions  Concentration:No data recorded Attention Span:No data recorded Recall:No data recorded Fund of Knowledge:No data recorded Language:No data recorded  Psychomotor Activity  Psychomotor Activity:No data recorded  Assets  Assets:No data recorded  Sleep  Sleep:No data recorded   Physical Exam: Physical Exam Vitals and nursing note reviewed.  Constitutional:      Appearance: Normal appearance.  HENT:     Head:  Normocephalic and atraumatic.     Mouth/Throat:     Pharynx: Oropharynx is clear.  Eyes:     Pupils: Pupils are equal, round, and reactive to light.  Cardiovascular:     Rate and Rhythm: Normal rate and regular rhythm.  Pulmonary:     Effort: Pulmonary effort is normal.     Breath sounds: Normal breath sounds.  Abdominal:     General: Abdomen is flat.     Palpations: Abdomen is soft.  Musculoskeletal:        General: Normal range of motion.  Skin:    General: Skin is warm and dry.  Neurological:     General: No focal deficit present.     Mental Status: She is alert. Mental status is at baseline.  Psychiatric:        Attention and Perception: She is inattentive.        Mood and Affect: Mood is elated. Affect is labile, angry and inappropriate.        Speech: Speech is rapid and pressured and tangential.        Behavior: Behavior is agitated.        Thought Content: Thought content is paranoid and delusional.        Judgment: Judgment is impulsive and inappropriate.    Review of Systems  Constitutional: Negative.   HENT: Negative.    Eyes: Negative.   Respiratory: Negative.    Cardiovascular: Negative.   Gastrointestinal: Negative.   Musculoskeletal: Negative.   Skin: Negative.   Neurological: Negative.   Psychiatric/Behavioral:  Negative for depression, hallucinations, memory loss, substance abuse and suicidal ideas. The patient is nervous/anxious. The patient does not have insomnia.    Blood pressure (!) 138/103, pulse 99, temperature 97.9 F (36.6 C), temperature source Oral, resp. rate 18, height 5\' 7"  (1.702 m), weight 54.4 kg, SpO2 98 %. Body mass index is 18.79 kg/m.   Treatment Plan Summary: Medication management and Plan during treatment  team and afterwards I have tried to speak with the patient to educate her about her medicine and my recommendations.  I tried to tell her that her previous diagnosis appeared to be bipolar depression and that that may have been  why another provider started her on fluoxetine, but that she is currently having a manic episode without any symptoms of depression and that in that case fluoxetine is contraindicated and that Seroquel would be the preferred medication.  I was not able to get more than a few words into this explanation before the patient was again threatening to "press charges" if I did not put her back on just the fluoxetine.  She also told me that she would not tell me who her outpatient providers at [facility] were and that if I tried to contact [place] she would "press charges".  My conclusion is that the patient's mania is to the point of psychosis and that she is incapable of making decisions about her treatment and will probably require forced medications.  I am asking Dr. Marlou Porch to add a note if he agrees.  Meanwhile I am going to order the fluoxetine at only 10 mg in the hope that by doing that maybe we can build enough rapport that she will take the rest of her medicine.  Start the Seroquel tonight at 100 mg although if possible I will have a forced medicine backup shot for that.  Agitation medication still available.  Mordecai Rasmussen, MD 06/20/2022, 9:59 AM

## 2022-06-20 NOTE — Group Note (Signed)
B[facility] LCSW Group Therapy Note   Group Date: 06/20/2022 Start Time: 1300 End Time: 1400   Type of Therapy/Topic:  Group Therapy:  Emotion Regulation  Participation Level:  Did Not Attend   Mood:  Description of Group:    The purpose of this group is to assist patients in learning to regulate negative emotions and experience positive emotions. Patients will be guided to discuss ways in which they have been vulnerable to their negative emotions. These vulnerabilities will be juxtaposed with experiences of positive emotions or situations, and patients challenged to use positive emotions to combat negative ones. Special emphasis will be placed on coping with negative emotions in conflict situations, and patients will process healthy conflict resolution skills.  Therapeutic Goals: Patient will identify two positive emotions or experiences to reflect on in order to balance out negative emotions:  Patient will label two or more emotions that they find the most difficult to experience:  Patient will be able to demonstrate positive conflict resolution skills through discussion or role plays:   Summary of Patient Progress:  Patient did not attend group despite encouraged participation.    Therapeutic Modalities:   Cognitive Behavioral Therapy Feelings Identification Dialectical Behavioral Therapy   Arta Stump W Digna Countess, LCSWA 

## 2022-06-20 NOTE — BH IP Treatment Plan (Addendum)
Interdisciplinary Treatment and Diagnostic Plan Update  06/20/2022 Time of Session: 0830 Sherrilee Steigerwald MRN: [id number]  Principal Diagnosis: Bipolar affective disorder, manic, severe, with psychotic behavior (HCC)  Secondary Diagnoses: Principal Problem:   Bipolar affective disorder, manic, severe, with psychotic behavior (HCC)   Current Medications:  Current Facility-Administered Medications  Medication Dose Route Frequency Provider Last Rate Last Admin   acetaminophen (TYLENOL) tablet 650 mg  650 mg Oral Q6H PRN Bennett, Christal H, NP       alum & mag hydroxide-simeth (MAALOX/MYLANTA) 200-200-20 MG/5ML suspension 30 mL  30 mL Oral Q4H PRN Bennett, Christal H, NP       diphenhydrAMINE (BENADRYL) capsule 50 mg  50 mg Oral TID PRN Bennett, Christal H, NP       Or   diphenhydrAMINE (BENADRYL) injection 50 mg  50 mg Intramuscular TID PRN Bennett, Christal H, NP       FLUoxetine (PROZAC) capsule 10 mg  10 mg Oral Daily Clapacs, John T, MD       haloperidol (HALDOL) tablet 5 mg  5 mg Oral TID PRN Bennett, Christal H, NP       Or   haloperidol lactate (HALDOL) injection 5 mg  5 mg Intramuscular TID PRN Bennett, Christal H, NP       hydrOXYzine (ATARAX) tablet 50 mg  50 mg Oral TID PRN Bennett, Christal H, NP   50 mg at 06/19/22 1414   LORazepam (ATIVAN) tablet 2 mg  2 mg Oral TID PRN Bennett, Christal H, NP       Or   LORazepam (ATIVAN) injection 2 mg  2 mg Intramuscular TID PRN Bennett, Christal H, NP       OLANZapine zydis (ZYPREXA) disintegrating tablet 10 mg  10 mg Oral Q8H PRN Clapacs, Jackquline Denmark, MD       And   LORazepam (ATIVAN) tablet 1 mg  1 mg Oral PRN Clapacs, Jackquline Denmark, MD       And   ziprasidone (GEODON) injection 20 mg  20 mg Intramuscular PRN Clapacs, Jackquline Denmark, MD       magnesium hydroxide (MILK OF MAGNESIA) suspension 30 mL  30 mL Oral Daily PRN Bennett, Christal H, NP       OLANZapine zydis (ZYPREXA) disintegrating tablet 10 mg  10 mg Oral BID Clapacs, John T, MD        QUEtiapine (SEROQUEL) tablet 100 mg  100 mg Oral QHS Clapacs, John T, MD       traZODone (DESYREL) tablet 50 mg  50 mg Oral QHS PRN Bennett, Christal H, NP       PTA Medications: Medications Prior to Admission  Medication Sig Dispense Refill Last Dose   Cetirizine HCl 10 MG CAPS Take 1 capsule (10 mg total) by mouth daily for 10 days. 10 capsule 0    fluconazole (DIFLUCAN) 150 MG tablet Take 1 tablet (150 mg total) by mouth once a week. 2 tablet 0  at unknown   FLUoxetine (PROZAC) 10 MG capsule Take 10 mg by mouth every morning.      levocetirizine (XYZAL) 5 MG tablet Take 1 tablet (5 mg total) by mouth every evening. (Patient not taking: Reported on 06/16/2022) 30 tablet 0    lidocaine (LIDODERM) 5 % Place 1 patch onto the skin daily. Remove & Discard patch within 12 hours (Patient not taking: Reported on 06/16/2022) 5 patch 0    metroNIDAZOLE (FLAGYL) 500 MG tablet Take 1 tablet (500 mg total) by mouth 2 (two) times  daily. (Patient not taking: Reported on 06/16/2022) 14 tablet 0    naproxen (NAPROSYN) 375 MG tablet Take 1 tablet (375 mg total) by mouth 2 (two) times daily. (Patient not taking: Reported on 06/16/2022) 20 tablet 0    QUEtiapine (SEROQUEL) 100 MG tablet Take 100 mg by mouth at bedtime.       Patient Stressors: Legal issue   Marital or family conflict    Patient Strengths: Ability for insight  Average or above average intelligence  Religious Affiliation   Treatment Modalities: Medication Management, Group therapy, Case management,  1 to 1 session with clinician, Psychoeducation, Recreational therapy.   Physician Treatment Plan for Primary Diagnosis: Bipolar affective disorder, manic, severe, with psychotic behavior (HCC) Long Term Goal(s): Improvement in symptoms so as ready for discharge   Short Term Goals: Compliance with prescribed medications will improve Ability to verbalize feelings will improve Ability to demonstrate self-control will improve  Medication  Management: Evaluate patient's response, side effects, and tolerance of medication regimen.  Therapeutic Interventions: 1 to 1 sessions, Unit Group sessions and Medication administration.  Evaluation of Outcomes: Progressing  Physician Treatment Plan for Secondary Diagnosis: Principal Problem:   Bipolar affective disorder, manic, severe, with psychotic behavior (HCC)  Long Term Goal(s): Improvement in symptoms so as ready for discharge   Short Term Goals: Compliance with prescribed medications will improve Ability to verbalize feelings will improve Ability to demonstrate self-control will improve     Medication Management: Evaluate patient's response, side effects, and tolerance of medication regimen.  Therapeutic Interventions: 1 to 1 sessions, Unit Group sessions and Medication administration.  Evaluation of Outcomes: Progressing   RN Treatment Plan for Primary Diagnosis: Bipolar affective disorder, manic, severe, with psychotic behavior (HCC) Long Term Goal(s): Knowledge of disease and therapeutic regimen to maintain health will improve  Short Term Goals: Ability to remain free from injury will improve, Ability to verbalize frustration and anger appropriately will improve, Ability to demonstrate self-control, Ability to participate in decision making will improve, Ability to verbalize feelings will improve, Ability to disclose and discuss suicidal ideas, Ability to identify and develop effective coping behaviors will improve, and Compliance with prescribed medications will improve  Medication Management: RN will administer medications as ordered by provider, will assess and evaluate patient's response and provide education to patient for prescribed medication. RN will report any adverse and/or side effects to prescribing provider.  Therapeutic Interventions: 1 on 1 counseling sessions, Psychoeducation, Medication administration, Evaluate responses to treatment, Monitor vital signs and  CBGs as ordered, Perform/monitor CIWA, COWS, AIMS and Fall Risk screenings as ordered, Perform wound care treatments as ordered.  Evaluation of Outcomes: Progressing   LCSW Treatment Plan for Primary Diagnosis: Bipolar affective disorder, manic, severe, with psychotic behavior (HCC) Long Term Goal(s): Safe transition to appropriate next level of care at discharge, Engage patient in therapeutic group addressing interpersonal concerns.  Short Term Goals: Engage patient in aftercare planning with referrals and resources, Increase social support, Increase ability to appropriately verbalize feelings, Increase emotional regulation, Facilitate acceptance of mental health diagnosis and concerns, Facilitate patient progression through stages of change regarding substance use diagnoses and concerns, Identify triggers associated with mental health/substance abuse issues, and Increase skills for wellness and recovery  Therapeutic Interventions: Assess for all discharge needs, 1 to 1 time with Social worker, Explore available resources and support systems, Assess for adequacy in community support network, Educate family and significant other(s) on suicide prevention, Complete Psychosocial Assessment, Interpersonal group therapy.  Evaluation of Outcomes: Progressing  Progress in Treatment: Attending groups: No. Participating in groups: No. Taking medication as prescribed: Yes. Toleration medication: Yes. Family/Significant other contact made: No, will contact:  CSW to obtain consent to reach family/friend Patient understands diagnosis: No. Discussing patient identified problems/goals with staff: Yes. Medical problems stabilized or resolved: Yes. Denies suicidal/homicidal ideation: Yes. Issues/concerns per patient self-inventory: Yes. Other: none  New problem(s) identified: No, Describe:  none  New Short Term/Long Term Goal(s): Patient to work towards elimination of symptoms of psychosis, medication  management for mood stabilization; elimination of SI thoughts; development of comprehensive mental wellness plan.  Patient Goals:  Patient is currently unable to develop any goals at this time due to acute mania.   Discharge Plan or Barriers: No psychosocial barriers identified at this time, patient to return to place of residence when appropriate for discharge.   Reason for Continuation of Hospitalization: Mania Medication stabilization  Estimated Length of Stay: 1-7 days   Scribe for Treatment Team: Almedia Balls 06/20/2022 10:16 AM

## 2022-06-20 NOTE — Progress Notes (Signed)
D- Patient alert and oriented x 3. Affect anxious and angry/mood hypermanic, labile and hyper verbal. Denies SI/ HI/ AVH. Patient denies pain. Patient endorses anxiety but states "I still feel good". Denies depression. Pacing in unit disrupting and intrusive towards staff and other patients. Continues to threaten staff that she is going to "press charges". A- Refused scheduled medications, MD aware. Support and encouragement provided.  Routine safety checks conducted every 15 minutes without incident.  R- Patient is intrusive and difficult to redirect. Refusing care and medications.  Maximum security from staff this shift. Will to closely monitor for safety.

## 2022-06-20 NOTE — Progress Notes (Signed)
Patient pacing the hallway, agitated because her mom brought her a bag of candy that she couldn't have because we do not allow outside food or beverage on the unit.

## 2022-06-20 NOTE — BH Assessment (Signed)
Recreation Therapy Notes  INPATIENT RECREATION THERAPY ASSESSMENT  Patient Details Name: Jaclyn Owens MRN: [id number] DOB: [birth year] Today's Date: 06/20/2022       Able to Participate in Assessment/Interview: No   Salina April LRT, CTRS Rickesha Veracruz Abner Greenspan  06/20/2022, 11:57 AM

## 2022-06-20 NOTE — Progress Notes (Signed)
[facility] Second Physician Opinion Progress Note for Medication Administration to Non-consenting Patients (For Involuntarily Committed Patients)  Patient: Jaclyn Owens Date of Birth: [phone] MRN: [id number]  Reason for the Medication: The patient, without the benefit of the specific treatment measure, is incapable of participating in any available treatment plan that will give the patient a realistic opportunity of improving the patient's condition. There is, without the benefit of the specific treatment measure, a significant possibility that the patient will harm self or others before improvement of the patient's condition is realized.  Consideration of Side Effects: Consideration of the side effects related to the medication plan has been given.  Rationale for Medication Administration: Patient has already placed her children and herself at risk of harm due to mental health problems. She could benefit from medication; forced if necessary.    Jaclyn Victory Tresea Mall, DO 06/20/22  10:17 AM   This documentation is good for (7) seven days from the date of the MD signature. New documentation must be completed every seven (7) days with detailed justification in the medical record if the patient requires continued non-emergent administration of psychotropic medications.

## 2022-06-20 NOTE — Progress Notes (Signed)
Patient continues to be agitated, refused PO medication for agitation.

## 2022-06-20 NOTE — Group Note (Signed)
Date:  06/20/2022 Time:  9:00 AM  Group Topic/Focus:  Goals Group:   The focus of this group is to help patients establish daily goals to achieve during treatment and discuss how the patient can incorporate goal setting into their daily lives to aide in recovery.  Community Group    Participation Level:  Did Not Attend  Othelia Pulling 06/20/2022, 6:26 PM

## 2022-06-20 NOTE — Group Note (Signed)
Recreation Therapy Group Note   Group Topic:Healthy Support Systems  Group Date: 06/20/2022 Start Time: 1000 End Time: 1100 Facilitators: Rosina Lowenstein, LRT, CTRS Location:  Craft Room  Group Description: Straw Bridge. Patients were given 10 plastic drinking straws and an equal length of masking tape. Using the materials provided, in pairs patients were instructed to build a free-standing bridge-like structure to suspend an everyday item (ex: deck of cards) off the floor or table surface. All materials were required to be used in Secondary school teacher. LRT facilitated post-activity discussion reviewing the importance of having strong and healthy support systems in our lives. LRT discussed how the people in our lives serve as the tape and the book we placed on top of our straw structure are the stressors we face in daily life. LRT and pts discussed what happens in our life when things get too heavy for [place], and we don't have strong supports outside of the hospital. Pt shared 2 of their healthy supports aloud in the group.   Goal Area(s) Addressed:  Patient will identify 2 healthy supports in their life. Patient will identify skills to successfully complete activity. Patient will identify correlation of this activity to life post-discharge.   Affect/Mood: Full range, Labile, and Manic   Participation Level: Hyperverbal   Participation Quality: Independent   Behavior: Bizarre, Disruptive, Hyperverbal, Impulsive, and Paranoid   Speech/Thought Process: Flight of ideas, Grandiose, Irrational, and Tangential   Insight: Poor   Judgement: Poor   Modes of Intervention: Activity   Patient Response to Interventions:  Disengaged and Resistant    Education Outcome:  In group clarification offered    Clinical Observations/Individualized Feedback: Getrude was minimally active in their participation of session activities and group discussion. Pt was in and out of group multiple times. Pt was  hyper-verbal and disruptive. Pt was talking about many things including: praying, pressing charges, her life if perfect, she is taking her medicine, everyone will get arrested, the cameras are recording, staff is trying to control her, and more.    Plan: Continue to engage patient in RT group sessions 2-3x/week.   Rosina Lowenstein, LRT, CTRS 06/20/2022 11:44 AM

## 2022-06-21 ENCOUNTER — Other Ambulatory Visit: Payer: Self-pay

## 2022-06-21 ENCOUNTER — Inpatient Hospital Stay (HOSPITAL_COMMUNITY)
Admission: AD | Admit: 2022-06-21 | Discharge: 2022-06-27 | DRG: 885 | Disposition: A | Discharge status: Home / Self Care | Payer: Medicaid Other | Source: Intra-hospital | Attending: Psychiatry | Admitting: Psychiatry

## 2022-06-21 ENCOUNTER — Encounter (HOSPITAL_COMMUNITY): Payer: Self-pay | Admitting: Psychiatry

## 2022-06-21 DIAGNOSIS — Z9151 Personal history of suicidal behavior: Secondary | ICD-10-CM

## 2022-06-21 DIAGNOSIS — F312 Bipolar disorder, current episode manic severe with psychotic features: Principal | ICD-10-CM

## 2022-06-21 DIAGNOSIS — K30 Functional dyspepsia: Secondary | ICD-10-CM | POA: Diagnosis present

## 2022-06-21 DIAGNOSIS — F19950 Other psychoactive substance use, unspecified with psychoactive substance-induced psychotic disorder with delusions: Secondary | ICD-10-CM | POA: Diagnosis not present

## 2022-06-21 DIAGNOSIS — Z87891 Personal history of nicotine dependence: Secondary | ICD-10-CM | POA: Diagnosis not present

## 2022-06-21 DIAGNOSIS — K5900 Constipation, unspecified: Secondary | ICD-10-CM | POA: Diagnosis present

## 2022-06-21 DIAGNOSIS — G4700 Insomnia, unspecified: Secondary | ICD-10-CM | POA: Diagnosis present

## 2022-06-21 DIAGNOSIS — Z79899 Other long term (current) drug therapy: Secondary | ICD-10-CM | POA: Diagnosis not present

## 2022-06-21 DIAGNOSIS — F419 Anxiety disorder, unspecified: Secondary | ICD-10-CM | POA: Diagnosis present

## 2022-06-21 DIAGNOSIS — F29 Unspecified psychosis not due to a substance or known physiological condition: Secondary | ICD-10-CM | POA: Diagnosis present

## 2022-06-21 MED ORDER — DIPHENHYDRAMINE HCL 50 MG/ML IJ SOLN
INTRAMUSCULAR | Status: AC
  Administered 2022-06-21: 50 mg
  Filled 2022-06-21: qty 1

## 2022-06-21 MED ORDER — HALOPERIDOL LACTATE 5 MG/ML IJ SOLN: 5.0000 mg | Freq: Three times a day (TID) | INTRAMUSCULAR | Status: DC | PRN

## 2022-06-21 MED ORDER — DIPHENHYDRAMINE HCL 50 MG/ML IJ SOLN: 50.0000 mg | Freq: Four times a day (QID) | INTRAMUSCULAR | Status: DC | PRN

## 2022-06-21 MED ORDER — ACETAMINOPHEN 325 MG PO TABS: 650.0000 mg | ORAL_TABLET | Freq: Four times a day (QID) | ORAL | Status: DC | PRN

## 2022-06-21 MED ORDER — DIPHENHYDRAMINE HCL 25 MG PO CAPS: 50.0000 mg | ORAL_CAPSULE | Freq: Three times a day (TID) | ORAL | Status: DC | PRN

## 2022-06-21 MED ORDER — TRAZODONE HCL 50 MG PO TABS
50.0000 mg | ORAL_TABLET | Freq: Every day | ORAL | Status: DC
  Administered 2022-06-23 – 2022-06-24 (×2): 50 mg via ORAL
  Filled 2022-06-21 (×7): qty 1

## 2022-06-21 MED ORDER — LORAZEPAM 2 MG/ML IJ SOLN: 2.0000 mg | Freq: Three times a day (TID) | INTRAMUSCULAR | Status: DC | PRN

## 2022-06-21 MED ORDER — QUETIAPINE FUMARATE 200 MG PO TABS: 200.0000 mg | ORAL_TABLET | Freq: Every day | ORAL | Status: DC

## 2022-06-21 MED ORDER — HYDROXYZINE HCL 25 MG PO TABS
25.0000 mg | ORAL_TABLET | Freq: Three times a day (TID) | ORAL | Status: DC | PRN
  Administered 2022-06-23 – 2022-06-26 (×6): 25 mg via ORAL
  Filled 2022-06-21 (×7): qty 1

## 2022-06-21 MED ORDER — PNEUMOCOCCAL 20-VAL CONJ VACC 0.5 ML IM SUSY
0.5000 mL | PREFILLED_SYRINGE | INTRAMUSCULAR | Status: DC
  Filled 2022-06-21: qty 0.5

## 2022-06-21 MED ORDER — MAGNESIUM HYDROXIDE 400 MG/5ML PO SUSP: 30.0000 mL | Freq: Every day | ORAL | Status: DC | PRN

## 2022-06-21 MED ORDER — HALOPERIDOL 5 MG PO TABS: 5.0000 mg | ORAL_TABLET | Freq: Three times a day (TID) | ORAL | Status: DC | PRN

## 2022-06-21 MED ORDER — ALUM & MAG HYDROXIDE-SIMETH 200-200-20 MG/5ML PO SUSP: 30.0000 mL | ORAL | Status: DC | PRN

## 2022-06-21 MED ORDER — QUETIAPINE FUMARATE 100 MG PO TABS: 100.0000 mg | ORAL_TABLET | Freq: Every day | ORAL | Status: DC

## 2022-06-21 MED ORDER — OLANZAPINE 10 MG IM SOLR
15.0000 mg | Freq: Two times a day (BID) | INTRAMUSCULAR | Status: DC | PRN
  Administered 2022-06-21: 15 mg via INTRAMUSCULAR
  Filled 2022-06-21: qty 20

## 2022-06-21 MED ORDER — LORAZEPAM 1 MG PO TABS: 2.0000 mg | ORAL_TABLET | Freq: Three times a day (TID) | ORAL | Status: DC | PRN

## 2022-06-21 MED ORDER — DIPHENHYDRAMINE HCL 50 MG/ML IJ SOLN: 50.0000 mg | Freq: Three times a day (TID) | INTRAMUSCULAR | Status: DC | PRN

## 2022-06-21 NOTE — Progress Notes (Signed)
Patient ID: Jaclyn Owens, female   DOB: 10/06/1995, 27 y.o.   MRN: [id number] Another follow-up note: At approximately 11:15 in the morning today the patient had escalated greatly.  She was pacing the ward shouting continuously making verbal threats towards staff talking about getting out of the ward.  Attempts were made to redirect the patient with appropriate verbal redirection as well as offers of comfort and time alone.  It was not possible to calm her down and she had become disruptive to other patients and to general management and was causing an unsafe environment.  Patient was administered forced medications and a very brief hold happened.  No mechanical restraints were used.  Total time in the hold was probably no more than 2 minutes.  Face-to-face has been completed with no sign of injury.

## 2022-06-21 NOTE — Progress Notes (Signed)
Patient gestured to this writer, by running her hand under her neck, that she does not want to take any medication today. MD notified during progression rounds.

## 2022-06-21 NOTE — Progress Notes (Signed)
Patient ID: Jaclyn Owens, female   DOB: Nov 04, 1995, 27 y.o.   MRN: [id number]  Discharge Note:  Patient denies SI/HI/AVH at this time. Patient discharged to [facility]. Patient belongings returned to patient. Patient questions and concerns addressed and answered. Patient ambulatory off unit. Patient discharged to [facility], via [facility] Department.

## 2022-06-21 NOTE — Progress Notes (Signed)
[facility] MD Progress Note  06/21/2022 10:47 AM Jaclyn Owens  MRN:  [id number] Subjective: Follow-up for this patient with bipolar disorder who is manic.  Patient continues to be highly intrusive.  Demanding to speak to staff almost continuously.  I sat down and talked with her today and attempted to explain her medicine regimen.  As we found yesterday she is unable to listen to even a complete sentence and so unable to engage in a conversation but just keeps up with her own line of talk.  Continues to threaten staff with "pressing charges".  I am told that she did take her quetiapine last night and took the fluoxetine this morning.  Patient requested of me that she be transferred to another hospital.  I told her that I could put forth a request to behavioral health Hospital but could not tell her what the outcome of that would be. Principal Problem: Bipolar affective disorder, manic, severe, with psychotic behavior (HCC) Diagnosis: Principal Problem:   Bipolar affective disorder, manic, severe, with psychotic behavior (HCC)  Total Time spent with patient: 30 minutes  Past Psychiatric History: Past history of bipolar disorder.  Information is coming out only in little bits.  She mentioned at 1 point today having once in the past been suicidal which suggest that she did have a severe depression but then would not answer any questions about it.  Past Medical History:  Past Medical History:  Diagnosis Date   Asthma    HSV (herpes simplex virus) infection     Past Surgical History:  Procedure Laterality Date   ARM WOUND REPAIR / CLOSURE     Family History:  Family History  Problem Relation Age of Onset   Asthma Mother    Diabetes Maternal Aunt    Cancer Maternal Grandmother    Family Psychiatric  History: See previous Social History:  Social History   Substance and Sexual Activity  Alcohol Use Not Currently   Comment: UTA, pt is not cooperative with triage questions 06/16/22     Social  History   Substance and Sexual Activity  Drug Use Yes   Types: Marijuana    Social History   Socioeconomic History   Marital status: Single    Spouse name: Not on file   Number of children: 1   Years of education: 13   Highest education level: High school graduate  Occupational History   Not on file  Tobacco Use   Smoking status: Former    Types: Cigarettes, Cigars   Smokeless tobacco: Never  Vaping Use   Vaping Use: Former  Substance and Sexual Activity   Alcohol use: Not Currently    Comment: UTA, pt is not cooperative with triage questions 06/16/22   Drug use: Yes    Types: Marijuana   Sexual activity: Yes    Birth control/protection: None  Other Topics Concern   Not on file  Social History Narrative   Not on file   Social Determinants of Health   Financial Resource Strain: Low Risk  (01/10/2018)   Overall Financial Resource Strain (CARDIA)    Difficulty of Paying Living Expenses: Not very hard  Food Insecurity: No Food Insecurity (06/19/2022)   Hunger Vital Sign    Worried About Running Out of Food in the Last Year: Never true    Ran Out of Food in the Last Year: Never true  Transportation Needs: No Transportation Needs (06/19/2022)   PRAPARE - Administrator, Civil Service (Medical): No  Lack of Transportation (Non-Medical): No  Physical Activity: Inactive (01/10/2018)   Exercise Vital Sign    Days of Exercise per Week: 0 days    Minutes of Exercise per Session: 0 min  Stress: No Stress Concern Present (01/10/2018)   [employer] of Occupational Health - Occupational Stress Questionnaire    Feeling of Stress : Not at all  Social Connections: Somewhat Isolated (01/10/2018)   Social Connection and Isolation Panel [NHANES]    Frequency of Communication with Friends and Family: More than three times a week    Frequency of Social Gatherings with Friends and Family: More than three times a week    Attends Religious Services: More than 4 times  per year    Active Member of [employer] or Organizations: No    Attends Engineer, structural: Never    Marital Status: Never married   Additional Social History:                         Sleep: Fair  Appetite:  Fair  Current Medications: Current Facility-Administered Medications  Medication Dose Route Frequency Provider Last Rate Last Admin   acetaminophen (TYLENOL) tablet 650 mg  650 mg Oral Q6H PRN Bennett, Christal H, NP       alum & mag hydroxide-simeth (MAALOX/MYLANTA) 200-200-20 MG/5ML suspension 30 mL  30 mL Oral Q4H PRN Bennett, Christal H, NP       hydrOXYzine (ATARAX) tablet 50 mg  50 mg Oral TID PRN Bennett, Christal H, NP   50 mg at 06/19/22 1414   OLANZapine zydis (ZYPREXA) disintegrating tablet 10 mg  10 mg Oral Q8H PRN Erie Sica, Jackquline Denmark, MD       And   LORazepam (ATIVAN) tablet 1 mg  1 mg Oral PRN Jerusalem Brownstein, Jackquline Denmark, MD       And   ziprasidone (GEODON) injection 20 mg  20 mg Intramuscular PRN Youcef Klas, Jackquline Denmark, MD       magnesium hydroxide (MILK OF MAGNESIA) suspension 30 mL  30 mL Oral Daily PRN Bennett, Christal H, NP       OLANZapine (ZYPREXA) injection 15 mg  15 mg Intramuscular BID PRN Kalman Nylen T, MD       QUEtiapine (SEROQUEL) tablet 100 mg  100 mg Oral Daily Roni Scow T, MD       QUEtiapine (SEROQUEL) tablet 200 mg  200 mg Oral QHS Graeden Bitner T, MD       traZODone (DESYREL) tablet 50 mg  50 mg Oral QHS PRN Bennett, Christal H, NP        Lab Results: No results found for this or any previous visit (from the past 48 hour(s)).  Blood Alcohol level:  Lab Results  Component Value Date   ETH <10 06/16/2022   ETH <10 02/24/2017    Metabolic Disorder Labs: Lab Results  Component Value Date   HGBA1C 5.0 08/29/2017   No results found for: "PROLACTIN" No results found for: "CHOL", "TRIG", "HDL", "CHOLHDL", "VLDL", "LDLCALC"  Physical Findings: AIMS:  , ,  ,  ,    CIWA:    COWS:     Musculoskeletal: Strength & Muscle Tone: within normal  limits Gait & Station: normal Patient leans: N/A  Psychiatric Specialty Exam:  Presentation  General Appearance: No data recorded Eye Contact:No data recorded Speech:No data recorded Speech Volume:No data recorded Handedness:No data recorded  Mood and Affect  Mood:No data recorded Affect:No data recorded  Thought Process  Thought Processes:No data recorded Descriptions of Associations:No data recorded Orientation:No data recorded Thought Content:No data recorded History of Schizophrenia/Schizoaffective disorder:No  Duration of Psychotic Symptoms:Less than six months  Hallucinations:No data recorded Ideas of Reference:No data recorded Suicidal Thoughts:No data recorded Homicidal Thoughts:No data recorded  Sensorium  Memory:No data recorded Judgment:No data recorded Insight:No data recorded  Executive Functions  Concentration:No data recorded Attention Span:No data recorded Recall:No data recorded Fund of Knowledge:No data recorded Language:No data recorded  Psychomotor Activity  Psychomotor Activity:No data recorded  Assets  Assets:No data recorded  Sleep  Sleep:No data recorded   Physical Exam: Physical Exam Vitals and nursing note reviewed.  Constitutional:      Appearance: Normal appearance.  HENT:     Head: Normocephalic and atraumatic.     Mouth/Throat:     Pharynx: Oropharynx is clear.  Eyes:     Pupils: Pupils are equal, round, and reactive to light.  Cardiovascular:     Rate and Rhythm: Normal rate and regular rhythm.  Pulmonary:     Effort: Pulmonary effort is normal.     Breath sounds: Normal breath sounds.  Abdominal:     General: Abdomen is flat.     Palpations: Abdomen is soft.  Musculoskeletal:        General: Normal range of motion.  Skin:    General: Skin is warm and dry.  Neurological:     General: No focal deficit present.     Mental Status: She is alert. Mental status is at baseline.  Psychiatric:        Attention and  Perception: She is inattentive.        Mood and Affect: Mood is elated. Affect is inappropriate.        Speech: Speech is rapid and pressured and tangential.        Behavior: Behavior is agitated.        Thought Content: Thought content is paranoid.        Cognition and Memory: Cognition is impaired.        Judgment: Judgment is inappropriate.    Review of Systems  Constitutional: Negative.   HENT: Negative.    Eyes: Negative.   Respiratory: Negative.    Cardiovascular: Negative.   Gastrointestinal: Negative.   Musculoskeletal: Negative.   Skin: Negative.   Neurological: Negative.   Psychiatric/Behavioral: Negative.     Blood pressure (!) 138/103, pulse 99, temperature 97.9 F (36.6 C), temperature source Oral, resp. rate 18, height 5\' 7"  (1.702 m), weight 54.4 kg, SpO2 98 %. Body mass index is 18.79 kg/m.   Treatment Plan Summary: Medication management and Plan today I have discontinued the fluoxetine after informing her that I was doing it and why.  I do not know if she even understood what I was saying.  As noted above it is essentially impossible to have a back-and-forth conversation.  I am also increasing her Seroquel both by increasing the nighttime dose and adding in daytime dose.  I explained to her the reason for that.  I have expanded the forced medication order to cover both day and nighttime doses.  Patient wanted to insist to me that she did not have schizophrenia.  I told her that I agreed with that and had never said she had schizophrenia.  She seems to be stuck on the fact that she believes olanzapine is only used for schizophrenia and will not listen to any discussion about how that medicine is appropriate and bipolar disorder as well.  I  have put forth a request to [facility] for transfer to behavioral health Hospital.  I doubt very much that this will be successful but I told the patient I would at least make the request.  I do not think transfer to another hospital honestly  is indicated.  Mordecai Rasmussen, MD 06/21/2022, 10:47 AM

## 2022-06-21 NOTE — Progress Notes (Signed)
Patient ID: Jaclyn Owens, female   DOB: 28-Jan-1996, 27 y.o.   MRN: [id number] Pt pacing unit continuously. She has been intrusive, up to the desk almost constantly banging on doors and then demanding she be transferred to another facility. When writer attempted to re-educate patient becomes argumentative and threatening. She states '' get a Teacher, early years/pre. '' Pt then reporting '' I will keep calling 911 until I get what I need''  Pt behaviors remain very acute and disruptive. Donia Ast RN [facility] at [facility] notified of above due to current acuity.

## 2022-06-21 NOTE — Progress Notes (Signed)
D- Patient alert and oriented. Patient presents as anxious, labile, and suspicious. At first meeting, patient stated to this writer that "I'm good, I don't need to talk to anybody and people can stop coming up to me asking questions". Patient reported on her self-inventory that she slept "good" last night and had no complaints to voice to this Clinical research associate. Patient denies SI, HI, AVH, and pain at this time. Although patient endorsed depression and anxiety on her self-inventory, she did not voice any of this to this Clinical research associate. Patient has been up to the nurses station multiple times at this point demanding to be transferred to another facility, "[facility] shouldn't be holding me hostage because I'm taking my medicine". Patient does this clicking noise with her tongue, after each sentence. Patient also thinks that one of out MHTs on staff today, put something in her drink, "people being nosy bringing negative energy". Patient left out of the medication room, and them came back and said "I told that Clydie Braun to stop playing with my drinks and keep that negative energy away from my door". Patient was then seen using paper towels wiping all around her door. Per patient's self-inventory, her goal for today is to "follow everyone rules, less quiet". Patient also stated that "I'ma have a fantastic day, like yesterday".  A- Some scheduled medications administered to patient, per MD orders. Support and encouragement provided. Routine safety checks conducted every 15 minutes. Patient informed to notify staff with problems or concerns.  R- No adverse drug reactions noted. Patient contracts for safety at this time. Patient compliant with some medications. Patient remains safe at this time.     06/21/22 0855  Psych Admission Type (Psych Patients Only)  Admission Status Involuntary  Psychosocial Assessment  Patient Complaints Agitation;Anxiety;Irritability;Restlessness  Eye Contact Fair  Facial Expression Anxious;Worried  Affect  Irritable;Preoccupied;Labile  Speech Rapid;Pressured;Loud;Tangential  Interaction Demanding;Defensive;Intrusive;Needy  Motor Activity Pacing;Restless;Slow  Appearance/Hygiene In scrubs;Layered clothes;Bizarre  Behavior Characteristics Agitated;Anxious;Impulsive;Intrusive;Irritable;Pacing;Resistant to care (resistant to taking Zyprexa)  Mood Anxious;Labile;Suspicious  Aggressive Behavior  Effect No apparent injury  Thought Process  Coherency Circumstantial;Disorganized;Flight of ideas;Loose associations;Tangential  Content Blaming others;Delusions;Preoccupation;Religiosity;Paranoia  Delusions Paranoid;Religious  Perception Depersonalization;Derealization  Hallucination None reported or observed  Judgment Impaired  Confusion Mild  Danger to Self  Current suicidal ideation? Denies  Self-Injurious Behavior No self-injurious ideation or behavior indicators observed or expressed   Agreement Not to Harm Self Yes  Description of Agreement Verbal  Danger to Others  Danger to Others None reported or observed

## 2022-06-21 NOTE — Progress Notes (Signed)
Patient refused scheduled Seroquel, stating that she only takes this medication at night.

## 2022-06-21 NOTE — Progress Notes (Signed)
Patient ID: Jaclyn Owens, female   DOB: 1995/08/24, 27 y.o.   MRN: [id number] This is a follow-up note to document that I did in fact make a request of the [facility] in charge of psychiatric bed placement for transfer of the patient to behavioral health Hospital.  I was told that no beds were available for this patient and transfer was not possible.

## 2022-06-21 NOTE — Progress Notes (Signed)
Patient refused to take Zyprexa, stating that it wasn't her medication and she only takes Prozac. Patient told this writer to make sure that I have her correct medication the next time. Patient bit her Prozac capsule and poured the contents into her mouth and discarded the empty capsule into the trash. MD will be notified.

## 2022-06-21 NOTE — Progress Notes (Signed)
Patient is now awake and at the nurses station asking for food, stating that she is hungry. Patient also asked this writer "why she keep coming in my room, asking me if I'm scared. I'm good, I don't need anybody checking on me". This Clinical research associate tried to explain to patient that our MHTs have to do 15 minute rounds and check on every patient. Patient did not understand what this writer was trying to explain to her. Patient then turned her attention to the nurse manager asking her why she was going into rooms. This writer proceeds to tell patient that another lunch tray would be sent up for her. Patient looks over to the other staff nurse on shift and asked "why is she in my chart, I can see my picture, you know what I'ma go ask her", and patient walked off to the other side of the nurses station, to the other nurse. It was explained to patient that dining services asks for patient's MRN when placing orders. Patient seemed to understand what staff was telling her and walked away to her room and slammed her door.

## 2022-06-21 NOTE — [facility] Suicide Risk Assessment (Signed)
[facility] Discharge Suicide Risk Assessment   Principal Problem: Bipolar affective disorder, manic, severe, with psychotic behavior (HCC) Discharge Diagnoses: Principal Problem:   Bipolar affective disorder, manic, severe, with psychotic behavior (HCC)   Total Time spent with patient: 15 minutes  Musculoskeletal: Strength & Muscle Tone: within normal limits Gait & Station: normal Patient leans: N/A  Psychiatric Specialty Exam  Presentation  General Appearance: No data recorded Eye Contact:No data recorded Speech:No data recorded Speech Volume:No data recorded Handedness:No data recorded  Mood and Affect  Mood:No data recorded Duration of Depression Symptoms: No data recorded Affect:No data recorded  Thought Process  Thought Processes:No data recorded Descriptions of Associations:No data recorded Orientation:No data recorded Thought Content:No data recorded History of Schizophrenia/Schizoaffective disorder:No  Duration of Psychotic Symptoms:Less than six months  Hallucinations:No data recorded Ideas of Reference:No data recorded Suicidal Thoughts:No data recorded Homicidal Thoughts:No data recorded  Sensorium  Memory:No data recorded Judgment:No data recorded Insight:No data recorded  Executive Functions  Concentration:No data recorded Attention Span:No data recorded Recall:No data recorded Fund of Knowledge:No data recorded Language:No data recorded  Psychomotor Activity  Psychomotor Activity:No data recorded  Assets  Assets:No data recorded  Sleep  Sleep:No data recorded  Physical Exam: Physical Exam Constitutional:      Appearance: Normal appearance.  HENT:     Head: Normocephalic and atraumatic.     Mouth/Throat:     Pharynx: Oropharynx is clear.  Eyes:     Pupils: Pupils are equal, round, and reactive to light.  Cardiovascular:     Rate and Rhythm: Normal rate and regular rhythm.  Pulmonary:     Effort: Pulmonary effort is normal.     Breath  sounds: Normal breath sounds.  Abdominal:     General: Abdomen is flat.     Palpations: Abdomen is soft.  Musculoskeletal:        General: Normal range of motion.  Skin:    General: Skin is warm and dry.  Neurological:     General: No focal deficit present.     Mental Status: She is alert. Mental status is at baseline.  Psychiatric:        Mood and Affect: Mood normal.        Thought Content: Thought content normal.    Review of Systems  Constitutional: Negative.   HENT: Negative.    Eyes: Negative.   Respiratory: Negative.    Cardiovascular: Negative.   Gastrointestinal: Negative.   Musculoskeletal: Negative.   Skin: Negative.   Neurological: Negative.   Psychiatric/Behavioral: Negative.     Blood pressure 95/69, pulse 76, temperature 98.3 F (36.8 C), temperature source Oral, resp. rate 18, height 5\' 7"  (1.702 m), weight 54.4 kg, SpO2 98 %. Body mass index is 18.79 kg/m.  Mental Status Per Nursing Assessment::   On Admission:  NA  Demographic Factors:  NA  Loss Factors: NA  Historical Factors: Impulsivity  Risk Reduction Factors:   Positive therapeutic relationship  Continued Clinical Symptoms:  Bipolar Disorder:   Mixed State  Cognitive Features That Contribute To Risk:  Loss of executive function    Suicide Risk:  Minimal: No identifiable suicidal ideation.  Patients presenting with no risk factors but with morbid ruminations; may be classified as minimal risk based on the severity of the depressive symptoms    Plan Of Care/Follow-up recommendations:  Other:  [facility]  Mordecai Rasmussen, MD 06/21/2022, 5:30 PM

## 2022-06-21 NOTE — CIRT (Addendum)
1110-STARR code called for this patient at 1110. Patient has been increasingly agitated, threatening to assault staff and calling 911. She has been disruptive to the milieu and intrusive with staff and demanding multiple times to be transferred to '' another hospital that will actually care for me '' Patient has been refusing medications and with bizarre behaviors, gesturing and waving paper towels in the hallway and then pulling fibers from doorway and placing them outside of her room. She is acutely psychotic and disruptive and making threats to staff. Additional staff called for support as when staff attempted to verbally redirect the patient she becomes more argumentative. Patient in the midst of staff drawing up medication started patient was heard hitting her door. Patient refused to cooperate or follow verbal redirection so manual hold was placed at 1115 from 1120 with upper shoulder arm hold and escort to her room. Manual hold was continued as pt tried to fight with staff when medications given. Patient then stated to staff '' I'm gonna fucking stay up for ya'll bitches. That's okay I'm going to stay up for ya'll'' '' This medication is not what I need and I'm going to press charges on you. I need a Teacher, early years/pre. All ya'll bitches gonna give me a shot. ''  Patient received medication safely, please refer to [facility].  MD aware of above event. Pt remained on 1.1. status post manual hold. Gigi RN Facilities manager and [facility] made aware of restraint event as well.  Pt remains very acute, requiring constant redirection. Will con't to monitor. Pt remains in the care of primary RN .Pt is safe.  1135- Pt refusing to allow staff to collect vital signs. Patient states '' maybe later not now.,'' Pt refusing to go to room to rest, sitting outside phones. 1150- pt finally agreed to return to room to rest. Respirations even and unlabored. Requested that her heat in her room be turned up. Pt is safe,refused food at this time.  Will con't to monitor. Pt remains in care of primary RN.

## 2022-06-21 NOTE — BHH Counselor (Signed)
CSW was to meet with pt to complete assessment. Pt is agitated, stating that staff member is nit picking with her and that she wants to be transferred. After speaking with provider, pt behaviors escalated, with her walking around nursing station, cussing and screaming that she is not schizophrenic and should not be on a medication that she feels is only for schizophrenia. Provider had tried to explain that this was not the only use for this medication but pt was not willing to accept/hear this information. Due to agitation, pt was administered agitation protocol (Zyprexa and Benadryl) to help with settling her down. CSW will attempt assessment at a later time.   Vilma Meckel. Algis Greenhouse, MSW, LCSW, LCAS 06/21/2022 11:22 AM

## 2022-06-21 NOTE — Group Note (Signed)
[facility] LCSW Group Therapy Note   Group Date: 06/21/2022 Start Time: 1300 End Time: 1350   Type of Therapy/Topic:  Group Therapy:  Balance in Life  Participation Level:  Did Not Attend   Description of Group:    This group will address the concept of balance and how it feels and looks when one is unbalanced. Patients will be encouraged to process areas in their lives that are out of balance, and identify reasons for remaining unbalanced. Facilitators will guide patients utilizing problem- solving interventions to address and correct the stressor making their life unbalanced. Understanding and applying boundaries will be explored and addressed for obtaining  and maintaining a balanced life. Patients will be encouraged to explore ways to assertively make their unbalanced needs known to significant others in their lives, using other group members and facilitator for support and feedback.  Therapeutic Goals: Patient will identify two or more emotions or situations they have that consume much of in their lives. Patient will identify signs/triggers that life has become out of balance:  Patient will identify two ways to set boundaries in order to achieve balance in their lives:  Patient will demonstrate ability to communicate their needs through discussion and/or role plays  Summary of Patient Progress: X   Therapeutic Modalities:   Cognitive Behavioral Therapy Solution-Focused Therapy Assertiveness Training   Shawn Dannenberg R Janifer Gieselman, LCSW 

## 2022-06-21 NOTE — Plan of Care (Signed)
Problem: Education: Goal: Knowledge of General Education information will improve Description: Including pain rating scale, medication(s)/side effects and non-pharmacologic comfort measures 06/21/2022 1613 by Doyce Para, RN Outcome: Adequate for Discharge 06/21/2022 1446 by Doyce Para, RN Outcome: Not Progressing   Problem: Health Behavior/Discharge Planning: Goal: Ability to manage health-related needs will improve 06/21/2022 1613 by Doyce Para, RN Outcome: Adequate for Discharge 06/21/2022 1446 by Doyce Para, RN Outcome: Not Progressing   Problem: Clinical Measurements: Goal: Ability to maintain clinical measurements within normal limits will improve 06/21/2022 1613 by Doyce Para, RN Outcome: Adequate for Discharge 06/21/2022 1446 by Doyce Para, RN Outcome: Not Progressing Goal: Will remain free from infection 06/21/2022 1613 by Doyce Para, RN Outcome: Adequate for Discharge 06/21/2022 1446 by Doyce Para, RN Outcome: Not Progressing Goal: Diagnostic test results will improve 06/21/2022 1613 by Doyce Para, RN Outcome: Adequate for Discharge 06/21/2022 1446 by Doyce Para, RN Outcome: Not Progressing Goal: Respiratory complications will improve 06/21/2022 1613 by Doyce Para, RN Outcome: Adequate for Discharge 06/21/2022 1446 by Doyce Para, RN Outcome: Not Progressing Goal: Cardiovascular complication will be avoided 06/21/2022 1613 by Doyce Para, RN Outcome: Adequate for Discharge 06/21/2022 1446 by Doyce Para, RN Outcome: Not Progressing   Problem: Activity: Goal: Risk for activity intolerance will decrease 06/21/2022 1613 by Doyce Para, RN Outcome: Adequate for Discharge 06/21/2022 1446 by Doyce Para, RN Outcome: Not Progressing   Problem: Nutrition: Goal: Adequate nutrition will be maintained 06/21/2022 1613 by Doyce Para, RN Outcome: Adequate  for Discharge 06/21/2022 1446 by Doyce Para, RN Outcome: Not Progressing   Problem: Coping: Goal: Level of anxiety will decrease 06/21/2022 1613 by Doyce Para, RN Outcome: Adequate for Discharge 06/21/2022 1446 by Doyce Para, RN Outcome: Not Progressing   Problem: Elimination: Goal: Will not experience complications related to bowel motility 06/21/2022 1613 by Doyce Para, RN Outcome: Adequate for Discharge 06/21/2022 1446 by Doyce Para, RN Outcome: Not Progressing Goal: Will not experience complications related to urinary retention 06/21/2022 1613 by Doyce Para, RN Outcome: Adequate for Discharge 06/21/2022 1446 by Doyce Para, RN Outcome: Not Progressing   Problem: Pain Managment: Goal: General experience of comfort will improve 06/21/2022 1613 by Doyce Para, RN Outcome: Adequate for Discharge 06/21/2022 1446 by Doyce Para, RN Outcome: Not Progressing   Problem: Safety: Goal: Ability to remain free from injury will improve 06/21/2022 1613 by Doyce Para, RN Outcome: Adequate for Discharge 06/21/2022 1446 by Doyce Para, RN Outcome: Not Progressing   Problem: Skin Integrity: Goal: Risk for impaired skin integrity will decrease 06/21/2022 1613 by Doyce Para, RN Outcome: Adequate for Discharge 06/21/2022 1446 by Doyce Para, RN Outcome: Not Progressing   Problem: Education: Goal: Knowledge of [place] [facility] Education information/materials will improve 06/21/2022 1613 by Doyce Para, RN Outcome: Adequate for Discharge 06/21/2022 1446 by Doyce Para, RN Outcome: Not Progressing Goal: Emotional status will improve 06/21/2022 1613 by Doyce Para, RN Outcome: Adequate for Discharge 06/21/2022 1446 by Doyce Para, RN Outcome: Not Progressing Goal: Mental status will improve 06/21/2022 1613 by Doyce Para, RN Outcome: Adequate for  Discharge 06/21/2022 1446 by Doyce Para, RN Outcome: Not Progressing Goal: Verbalization of understanding the information provided will improve 06/21/2022 1613 by Doyce Para, RN Outcome: Adequate for Discharge 06/21/2022 1446 by Doyce Para, RN Outcome: Not Progressing   Problem: Activity: Goal: Interest or engagement in activities will improve 06/21/2022 1613 by Doyce Para, RN Outcome: Adequate for Discharge 06/21/2022 1446 by Doyce Para, RN Outcome: Not Progressing Goal: Sleeping patterns will improve 06/21/2022 1613 by  Doyce Para, RN Outcome: Adequate for Discharge 06/21/2022 1446 by Doyce Para, RN Outcome: Not Progressing   Problem: Coping: Goal: Ability to verbalize frustrations and anger appropriately will improve 06/21/2022 1613 by Doyce Para, RN Outcome: Adequate for Discharge 06/21/2022 1446 by Doyce Para, RN Outcome: Not Progressing Goal: Ability to demonstrate self-control will improve 06/21/2022 1613 by Doyce Para, RN Outcome: Adequate for Discharge 06/21/2022 1446 by Doyce Para, RN Outcome: Not Progressing   Problem: Health Behavior/Discharge Planning: Goal: Identification of resources available to assist in meeting health care needs will improve 06/21/2022 1613 by Doyce Para, RN Outcome: Adequate for Discharge 06/21/2022 1446 by Doyce Para, RN Outcome: Not Progressing Goal: Compliance with treatment plan for underlying cause of condition will improve 06/21/2022 1613 by Doyce Para, RN Outcome: Adequate for Discharge 06/21/2022 1446 by Doyce Para, RN Outcome: Not Progressing   Problem: Physical Regulation: Goal: Ability to maintain clinical measurements within normal limits will improve 06/21/2022 1613 by Doyce Para, RN Outcome: Adequate for Discharge 06/21/2022 1446 by Doyce Para, RN Outcome: Not Progressing   Problem: Safety: Goal:  Periods of time without injury will increase 06/21/2022 1613 by Doyce Para, RN Outcome: Adequate for Discharge 06/21/2022 1446 by Doyce Para, RN Outcome: Not Progressing   Problem: Education: Goal: Ability to state activities that reduce stress will improve 06/21/2022 1613 by Doyce Para, RN Outcome: Adequate for Discharge 06/21/2022 1446 by Doyce Para, RN Outcome: Not Progressing   Problem: Coping: Goal: Ability to identify and develop effective coping behavior will improve 06/21/2022 1613 by Doyce Para, RN Outcome: Adequate for Discharge 06/21/2022 1446 by Doyce Para, RN Outcome: Not Progressing   Problem: Self-Concept: Goal: Ability to identify factors that promote anxiety will improve 06/21/2022 1613 by Doyce Para, RN Outcome: Adequate for Discharge 06/21/2022 1446 by Doyce Para, RN Outcome: Not Progressing Goal: Level of anxiety will decrease 06/21/2022 1613 by Doyce Para, RN Outcome: Adequate for Discharge 06/21/2022 1446 by Doyce Para, RN Outcome: Not Progressing Goal: Ability to modify response to factors that promote anxiety will improve 06/21/2022 1613 by Doyce Para, RN Outcome: Adequate for Discharge 06/21/2022 1446 by Doyce Para, RN Outcome: Not Progressing   Problem: Safety: Goal: Violent Restraint(s) 06/21/2022 1613 by Doyce Para, RN Outcome: Adequate for Discharge 06/21/2022 1446 by Doyce Para, RN Outcome: Not Progressing

## 2022-06-21 NOTE — Progress Notes (Signed)
Adult Psychoeducational Group Note  Date:  06/21/2022 Time:  8:43 PM  Group Topic/Focus:  Wrap-Up Group:   The focus of this group is to help patients review their daily goal of treatment and discuss progress on daily workbooks.  Participation Level:  Did Not Attend  Participation Quality:  Drowsy  Affect:    Cognitive:      Insight: None  Engagement in Group:    Modes of Intervention:    Additional Comments:  Pt wanted to sleep.   Satira Anis 06/21/2022, 8:43 PM

## 2022-06-21 NOTE — Group Note (Signed)
Recreation Therapy Group Note   Group Topic:Self-Esteem  Group Date: 06/21/2022 Start Time: 1000 End Time: 1100 Facilitators: Rosina Lowenstein, LRT, CTRS Location:  Craft Room  Group Description: Patients and LRT discussed the importance of self-love and self-esteem. Pt completed a worksheet that helps them identify 24 different strengths and qualities about themselves. Pt encouraged to read aloud at least 3 off their sheet to the group. LRT and pts discussed how this can be applied to daily life post-discharge.  Pt's then played "Positive Affirmation Bingo" afterwards, with journals, activity books or stress balls as bingo prizes.  Goal Area(s) Addressed: Patient will identify positive qualities about themselves. Patient will learn new positive affirmations.  Patient will recite positive qualities and affirmations aloud to the group.  Patient will increase communication.  Affect/Mood: N/A   Participation Level: Did not attend    Clinical Observations/Individualized Feedback: Jaclyn Owens did not attend group.   Plan: Continue to engage patient in RT group sessions 2-3x/week.   Rosina Lowenstein, LRT, CTRS 06/21/2022 11:45 AM

## 2022-06-21 NOTE — Plan of Care (Signed)
  Problem: Education: Goal: Knowledge of General Education information will improve Description: Including pain rating scale, medication(s)/side effects and non-pharmacologic comfort measures Outcome: Not Progressing   Problem: Health Behavior/Discharge Planning: Goal: Ability to manage health-related needs will improve Outcome: Not Progressing   Problem: Clinical Measurements: Goal: Ability to maintain clinical measurements within normal limits will improve Outcome: Not Progressing Goal: Will remain free from infection Outcome: Not Progressing Goal: Diagnostic test results will improve Outcome: Not Progressing Goal: Respiratory complications will improve Outcome: Not Progressing Goal: Cardiovascular complication will be avoided Outcome: Not Progressing   Problem: Activity: Goal: Risk for activity intolerance will decrease Outcome: Not Progressing   Problem: Nutrition: Goal: Adequate nutrition will be maintained Outcome: Not Progressing   Problem: Coping: Goal: Level of anxiety will decrease Outcome: Not Progressing   Problem: Elimination: Goal: Will not experience complications related to bowel motility Outcome: Not Progressing Goal: Will not experience complications related to urinary retention Outcome: Not Progressing   Problem: Pain Managment: Goal: General experience of comfort will improve Outcome: Not Progressing   Problem: Safety: Goal: Ability to remain free from injury will improve Outcome: Not Progressing   Problem: Skin Integrity: Goal: Risk for impaired skin integrity will decrease Outcome: Not Progressing   Problem: Education: Goal: Knowledge of [place] [facility] Education information/materials will improve Outcome: Not Progressing Goal: Emotional status will improve Outcome: Not Progressing Goal: Mental status will improve Outcome: Not Progressing Goal: Verbalization of understanding the information provided will improve Outcome: Not  Progressing   Problem: Activity: Goal: Interest or engagement in activities will improve Outcome: Not Progressing Goal: Sleeping patterns will improve Outcome: Not Progressing   Problem: Coping: Goal: Ability to verbalize frustrations and anger appropriately will improve Outcome: Not Progressing Goal: Ability to demonstrate self-control will improve Outcome: Not Progressing   Problem: Health Behavior/Discharge Planning: Goal: Identification of resources available to assist in meeting health care needs will improve Outcome: Not Progressing Goal: Compliance with treatment plan for underlying cause of condition will improve Outcome: Not Progressing   Problem: Physical Regulation: Goal: Ability to maintain clinical measurements within normal limits will improve Outcome: Not Progressing   Problem: Safety: Goal: Periods of time without injury will increase Outcome: Not Progressing   Problem: Education: Goal: Ability to state activities that reduce stress will improve Outcome: Not Progressing   Problem: Coping: Goal: Ability to identify and develop effective coping behavior will improve Outcome: Not Progressing   Problem: Self-Concept: Goal: Ability to identify factors that promote anxiety will improve Outcome: Not Progressing Goal: Level of anxiety will decrease Outcome: Not Progressing Goal: Ability to modify response to factors that promote anxiety will improve Outcome: Not Progressing   Problem: Safety: Goal: Violent Restraint(s) Outcome: Not Progressing

## 2022-06-21 NOTE — Progress Notes (Signed)
Report called to [employer]. Sheriff called for transport for IVC pt. Three copies of IVC paperwork faxed.

## 2022-06-21 NOTE — Progress Notes (Signed)
Patient ID: Jaclyn Owens, female   DOB: 1995/10/17, 27 y.o.   MRN: [id number]   Pt alert and oriented during [facility] admission process. Pt denies SI/HI, AVH, and any pain. Pt is calm and cooperative. Education, support, reassurance, and encouragement provided, q15 minute safety checks initiated. Pt's belongings in locker #7. Pt denies any concerns at this time, and verbally contracts for safety. Pt ambulating on the unit with no issues. Pt remains safe on and off the unit.

## 2022-06-21 NOTE — Progress Notes (Addendum)
Patient refused labs, stating "y'all can take my vitals and know I'm healthy, without taking blood work".

## 2022-06-21 NOTE — Group Note (Signed)
Date:  06/21/2022 Time:  9:55 AM  Group Topic/Focus:  Community Meeting    Participation Level:  Did Not Attend   Lynelle Smoke [facility] 06/21/2022, 9:55 AM

## 2022-06-21 NOTE — Progress Notes (Signed)
Patient ID: Sindy Guadeloupe, female   DOB: 1995-11-07, 27 y.o.   MRN: [id number]   Initial Treatment Plan 06/21/2022 6:28 PM Sindy Guadeloupe [id number]:[id number]    PATIENT STRESSORS: Medication change or noncompliance   Substance abuse     PATIENT STRENGTHS: Average or above average intelligence  Communication skills  Supportive family/friends    PATIENT IDENTIFIED PROBLEMS: Psychosis  Substance Use  Anger                 DISCHARGE CRITERIA:  Improved stabilization in mood, thinking, and/or behavior Reduction of life-threatening or endangering symptoms to within safe limits Verbal commitment to aftercare and medication compliance  PRELIMINARY DISCHARGE PLAN: Outpatient therapy Return to previous living arrangement Return to previous work or school arrangements  PATIENT/FAMILY INVOLVEMENT: This treatment plan has been presented to and reviewed with the patient, Anik Luevano, and/or family member.  The patient and family have been given the opportunity to ask questions and make suggestions.  Tania Ade, RN 06/21/2022, 6:28 PM

## 2022-06-22 ENCOUNTER — Encounter (HOSPITAL_COMMUNITY): Payer: Self-pay

## 2022-06-22 MED ORDER — QUETIAPINE FUMARATE 100 MG PO TABS
100.0000 mg | ORAL_TABLET | Freq: Every day | ORAL | Status: DC
  Administered 2022-06-22 – 2022-06-24 (×3): 100 mg via ORAL
  Filled 2022-06-22 (×5): qty 1

## 2022-06-22 NOTE — Progress Notes (Signed)
Pt verbalized she was taking medications at [facility] and they have not been continued. Pt asking to be restarted on her meds.

## 2022-06-22 NOTE — Group Note (Signed)
Date:  06/22/2022 Time:  11:25 AM  Group Topic/Focus:  Goals Group:   The focus of this group is to help patients establish daily goals to achieve during treatment and discuss how the patient can incorporate goal setting into their daily lives to aide in recovery. Orientation:   The focus of this group is to educate the patient on the purpose and policies of crisis stabilization and provide a format to answer questions about their admission.  The group details unit policies and expectations of patients while admitted.    Participation Level:  Did Not Attend   Jaclyn Owens Jaclyn Owens 06/22/2022, 11:25 AM
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
## 2022-06-22 NOTE — Progress Notes (Signed)
D- Patient alert and oriented x4. Patient slightly.  Denies SI, HI, AVH. Pt demanding and suspicious. She wanted certain staff to give her drinks and wanted staff to knock for each 15 minute checks. Pt states when staff enter room without knocking and she sees them in room when she turns around, it frightens her.    A- Pt refused bedtime medications and stated she was already sleepy from IM she received earlier in the day. Routine safety checks conducted every 15 minutes.  Patient informed to notify staff with problems or concerns.   R- Patient verbalized understanding treatment plan.    06/22/22 0200  Psych Admission Type (Psych Patients Only)  Admission Status Involuntary  Psychosocial Assessment  Patient Complaints Irritability;Suspiciousness  Eye Contact Suspiciousness  Facial Expression Anxious  Affect Anxious;Irritable;Labile  Speech Logical/coherent  Interaction Forwards little  Motor Activity Other (Comment) (WDL)  Appearance/Hygiene In scrubs  Behavior Characteristics Anxious;Irritable;Cooperative  Mood Anxious;Suspicious;Labile;Apprehensive;Irritable  Thought Process  Coherency WDL  Content Blaming others;Paranoia  Delusions Paranoid  Perception WDL  Hallucination None reported or observed  Judgment Poor  Confusion None  Danger to Self  Current suicidal ideation? Denies  Danger to Others  Danger to Others None reported or observed  Danger to Others Abnormal  Harmful Behavior to others No threats or harm toward other people

## 2022-06-22 NOTE — Progress Notes (Signed)
The patient came out of her bedroom about thirty minutes ago  to complain about this author opening her bedroom door. This Thereasa Parkin explained to the patient that she had to be checked on every fifteen minutes for safety reasons. The patient felt that the staff should knock on her door every time it is opened even when she is asleep. Patient's nurse explained that this was not possible since the staff doesn't when she is asleep and that it is against policy. The patient is unwilling to comply at this time.

## 2022-06-22 NOTE — Progress Notes (Signed)
   06/22/22 0555  15 Minute Checks  Location Bedroom  Visual Appearance Calm  Behavior Sleeping  Sleep (Behavioral Health Patients Only)  Calculate sleep? (Click Yes once per 24 hr at 0600 safety check) Yes  Documented sleep last 24 hours 7.25

## 2022-06-22 NOTE — Progress Notes (Signed)
Did not attend group 

## 2022-06-22 NOTE — Progress Notes (Signed)
Pt stated she was thankful for the doctor putting her back on her medications. Pt continues to be paranoid and suspicious on the unit, but has been cooperative this evening    06/22/22 2000  Psych Admission Type (Psych Patients Only)  Admission Status Involuntary  Psychosocial Assessment  Patient Complaints Apathy  Eye Contact Suspiciousness  Facial Expression Anxious  Affect Appropriate to circumstance  Speech Logical/coherent  Interaction Cautious;Guarded  Motor Activity Restless  Appearance/Hygiene Disheveled  Behavior Characteristics Cooperative  Mood Suspicious;Labile;Anxious  Aggressive Behavior  Effect No apparent injury  Thought Process  Coherency Circumstantial  Content Blaming others;Paranoia  Delusions Paranoid  Perception Derealization  Hallucination None reported or observed  Judgment Impaired  Confusion None  Danger to Self  Current suicidal ideation? Denies  Danger to Others  Danger to Others None reported or observed  Danger to Others Abnormal  Harmful Behavior to others No threats or harm toward other people
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
## 2022-06-22 NOTE — BHH Group Notes (Signed)
Spirituality group facilitated by Chaplain Katy Tienna Bienkowski, BCC.   Group Description: Group focused on topic of hope. Patients participated in facilitated discussion around topic, connecting with one another around experiences and definitions for hope. Group members engaged with visual explorer photos, reflecting on what hope looks like for them today. Group engaged in discussion around how their definitions of hope are present today in hospital.   Modalities: Psycho-social ed, Adlerian, Narrative, MI   Patient Progress: Did not attend.  

## 2022-06-22 NOTE — Progress Notes (Signed)
Pt noted with blunted affect, labile / irritable mood, intense eye contact with pressured speech, guarded, suspicious and paranoid on interactions. Noted wiping her room door with multiple towels and threatening to get another female peer if she touches her door again "What the fuck, tell her not to come to my fucking door again. I done warned her, she better not fucking come no more if she don't want to get fucked up". Demanding that staff calls her on every Q 15 minutes checks upon entering her room. Refused scheduled Pneumonia vaccine this AM "I did not agree to take anything besides what I need. I don't want none of that, I'm not taking shit I don't have to". Isolative to her room majority of this shift "I don't want to be around people right now. I will try to come" when prompted to attend scheduled groups. OOB to dayroom / milieu for snacks, I only want bottled water or gingerale" meals and back to bed. Safety checks maintained at Q 15 minutes intervals. Emotional support, encouragement and reassurance offered this shift. Pt tolerated meals and fluids well.  Pt continues to need verbal redirections to comply with current treatment regimen, milieu routines including groups.

## 2022-06-22 NOTE — BHH Group Notes (Signed)
Adult Psychoeducational Group Note  Date:  06/22/2022 Time:  8:28 PM  Group Topic/Focus:  Wrap-Up Group:   The focus of this group is to help patients review their daily goal of treatment and discuss progress on daily workbooks.  Participation Level:  Active  Participation Quality:  Appropriate  Affect:  Appropriate  Cognitive:  Appropriate  Insight: Improving  Engagement in Group:  Engaged  Modes of Intervention:  Discussion  Additional Comments:  Pt attended the evening wrap-up group. Tech introduced the staff for the evening, reminded group of the evening schedule and reminded them to ask for anything they need. Pt reported feeling 10 out of 10 today, with 1 being the worst and 10 being the best. Pt subsequently explored their current concerns from the day. Pt reported having slept good the previous night. Lastly, pt indicated they are not currently having any pain. Group ended with a reminder of when night medications will be dispensed and the rest of the evening schedule.   [name] 06/22/2022, 8:28 PM

## 2022-06-22 NOTE — BH IP Treatment Plan (Signed)
Interdisciplinary Treatment and Diagnostic Plan   06/22/2022 Time of Session: 1140 Jaclyn Owens MRN: [id number]  Principal Diagnosis: Drug psychosis, with delusions (HCC)  Secondary Diagnoses: Principal Problem:   Drug psychosis, with delusions (HCC)   Current Medications:  Current Facility-Administered Medications  Medication Dose Route Frequency Provider Last Rate Last Admin   acetaminophen (TYLENOL) tablet 650 mg  650 mg Oral Q6H PRN Nkwenti, Doris, NP       alum & mag hydroxide-simeth (MAALOX/MYLANTA) 200-200-20 MG/5ML suspension 30 mL  30 mL Oral Q4H PRN Nkwenti, Doris, NP       diphenhydrAMINE (BENADRYL) capsule 50 mg  50 mg Oral TID PRN Starleen Blue, NP       Or   diphenhydrAMINE (BENADRYL) injection 50 mg  50 mg Intramuscular TID PRN Starleen Blue, NP       haloperidol (HALDOL) tablet 5 mg  5 mg Oral TID PRN Starleen Blue, NP       Or   haloperidol lactate (HALDOL) injection 5 mg  5 mg Intramuscular TID PRN Starleen Blue, NP       hydrOXYzine (ATARAX) tablet 25 mg  25 mg Oral TID PRN Starleen Blue, NP       LORazepam (ATIVAN) tablet 2 mg  2 mg Oral TID PRN Starleen Blue, NP       Or   LORazepam (ATIVAN) injection 2 mg  2 mg Intramuscular TID PRN Starleen Blue, NP       magnesium hydroxide (MILK OF MAGNESIA) suspension 30 mL  30 mL Oral Daily PRN Nkwenti, Doris, NP       pneumococcal 20-valent conjugate vaccine (PREVNAR 20) injection 0.5 mL  0.5 mL Intramuscular Tomorrow-1000 Massengill, Nathan, MD       QUEtiapine (SEROQUEL) tablet 100 mg  100 mg Oral QHS Lewanda Rife, MD       traZODone (DESYREL) tablet 50 mg  50 mg Oral QHS Nkwenti, Doris, NP       PTA Medications: No medications prior to admission.    Patient Stressors: Medication change or noncompliance   Substance abuse    Patient Strengths: Average or above average intelligence  Communication skills  Supportive family/friends   Treatment Modalities: Medication Management, Group therapy, Case  management,  1 to 1 session with clinician, Psychoeducation, Recreational therapy.   Physician Treatment Plan for Primary Diagnosis: Drug psychosis, with delusions (HCC) Long Term Goal(s): Improvement in symptoms so as ready for discharge   Short Term Goals: Ability to verbalize feelings will improve Ability to disclose and discuss suicidal ideas Ability to demonstrate self-control will improve Ability to identify and develop effective coping behaviors will improve Compliance with prescribed medications will improve Ability to identify triggers associated with substance abuse/mental health issues will improve Ability to identify changes in lifestyle to reduce recurrence of condition will improve  Medication Management: Evaluate patient's response, side effects, and tolerance of medication regimen.  Therapeutic Interventions: 1 to 1 sessions, Unit Group sessions and Medication administration.  Evaluation of Outcomes: Progressing  Physician Treatment Plan for Secondary Diagnosis: Principal Problem:   Drug psychosis, with delusions (HCC)  Long Term Goal(s): Improvement in symptoms so as ready for discharge   Short Term Goals: Ability to verbalize feelings will improve Ability to disclose and discuss suicidal ideas Ability to demonstrate self-control will improve Ability to identify and develop effective coping behaviors will improve Compliance with prescribed medications will improve Ability to identify triggers associated with substance abuse/mental health issues will improve Ability to identify changes in lifestyle to reduce  recurrence of condition will improve     Medication Management: Evaluate patient's response, side effects, and tolerance of medication regimen.  Therapeutic Interventions: 1 to 1 sessions, Unit Group sessions and Medication administration.  Evaluation of Outcomes: Progressing   RN Treatment Plan for Primary Diagnosis: Drug psychosis, with delusions  (HCC) Long Term Goal(s): Knowledge of disease and therapeutic regimen to maintain health will improve  Short Term Goals: Ability to remain free from injury will improve, Ability to verbalize frustration and anger appropriately will improve, Ability to demonstrate self-control, Ability to participate in decision making will improve, Ability to verbalize feelings will improve, Ability to disclose and discuss suicidal ideas, Ability to identify and develop effective coping behaviors will improve, and Compliance with prescribed medications will improve  Medication Management: RN will administer medications as ordered by provider, will assess and evaluate patient's response and provide education to patient for prescribed medication. RN will report any adverse and/or side effects to prescribing provider.  Therapeutic Interventions: 1 on 1 counseling sessions, Psychoeducation, Medication administration, Evaluate responses to treatment, Monitor vital signs and CBGs as ordered, Perform/monitor CIWA, COWS, AIMS and Fall Risk screenings as ordered, Perform wound care treatments as ordered.  Evaluation of Outcomes: Progressing   LCSW Treatment Plan for Primary Diagnosis: Drug psychosis, with delusions (HCC) Long Term Goal(s): Safe transition to appropriate next level of care at discharge, Engage patient in therapeutic group addressing interpersonal concerns.  Short Term Goals: Engage patient in aftercare planning with referrals and resources, Increase social support, Increase ability to appropriately verbalize feelings, Increase emotional regulation, Facilitate acceptance of mental health diagnosis and concerns, Facilitate patient progression through stages of change regarding substance use diagnoses and concerns, Identify triggers associated with mental health/substance abuse issues, and Increase skills for wellness and recovery  Therapeutic Interventions: Assess for all discharge needs, 1 to 1 time with  Social worker, Explore available resources and support systems, Assess for adequacy in community support network, Educate family and significant other(s) on suicide prevention, Complete Psychosocial Assessment, Interpersonal group therapy.  Evaluation of Outcomes: Progressing   Progress in Treatment: Attending groups: Yes. Participating in groups: Yes. Taking medication as prescribed: Yes. Toleration medication: Yes. Family/Significant other contact made: No, will contact:  Consent needs to be signed Patient understands diagnosis: Yes. Discussing patient identified problems/goals with staff: Yes. Medical problems stabilized or resolved: Yes. Denies suicidal/homicidal ideation: Yes. Issues/concerns per patient self-inventory: Yes. Other: N/A  New problem(s) identified: No, Describe:  None Reported  New Short Term/Long Term Goal(s): stabilization, elimination of SI thoughts, development of comprehensive mental wellness plan.  medication  Patient Goals:  Medication Stabilization  Discharge Plan or Barriers: Patient recently admitted. CSW will continue to follow and assess for appropriate referrals and possible discharge planning.   Reason for Continuation of Hospitalization: Delusions  Hallucinations Medication stabilization Withdrawal symptoms  Estimated Length of Stay: 3-7 Days  Last 3 [place] Suicide Severity Risk Score: Flowsheet Row [facility] (Discharged) from 06/19/2022 in [facility]IC[facility] ED from 05/10/2022 in [facility] at [facility])  C-SSRS RISK CATEGORY High Risk No Risk No Risk       Last PHQ 2/9 Scores:    09/26/2017    2:42 PM  Depression screen PHQ 2/9  Decreased Interest 0  Down, Depressed, Hopeless 0  PHQ - 2 Score 0  Altered sleeping 1  Tired, decreased energy 1  Change in appetite 0  Feeling bad or failure  about yourself   0  Trouble concentrating 0  Moving slowly or fidgety/restless 0  Suicidal thoughts 0  PHQ-9 Score 2  Difficult doing work/chores Not difficult at all   detox, medication management for mood stabilization; elimination of SI thoughts; development of comprehensive mental wellness/sobriety plan    Scribe for Treatment Team: Ane Payment, LCSW 06/22/2022 3:41 PM

## 2022-06-23 MED ORDER — OLANZAPINE 5 MG PO TBDP
5.0000 mg | ORAL_TABLET | Freq: Two times a day (BID) | ORAL | Status: DC
  Administered 2022-06-23 – 2022-06-25 (×4): 5 mg via ORAL
  Filled 2022-06-23 (×10): qty 1

## 2022-06-23 MED ORDER — DIVALPROEX SODIUM 250 MG PO DR TAB
250.0000 mg | DELAYED_RELEASE_TABLET | Freq: Two times a day (BID) | ORAL | Status: DC
  Administered 2022-06-23 – 2022-06-25 (×4): 250 mg via ORAL
  Filled 2022-06-23 (×10): qty 1

## 2022-06-23 NOTE — Progress Notes (Signed)
   06/23/22 0900  Psych Admission Type (Psych Patients Only)  Admission Status Involuntary  Psychosocial Assessment  Patient Complaints Suspiciousness;Worrying;Anxiety  Eye Contact Suspiciousness  Facial Expression Anxious  Affect Appropriate to circumstance  Speech Logical/coherent  Interaction Cautious;Guarded  Motor Activity Restless  Appearance/Hygiene In scrubs  Behavior Characteristics Cooperative  Mood Anxious;Preoccupied;Suspicious  Thought Process  Coherency Circumstantial  Content Blaming others;Preoccupation;Paranoia  Delusions Paranoid  Perception Derealization  Hallucination None reported or observed  Judgment Impaired  Confusion None  Danger to Self  Current suicidal ideation? Denies  Self-Injurious Behavior No self-injurious ideation or behavior indicators observed or expressed   Agreement Not to Harm Self Yes  Description of Agreement verbal  Danger to Others  Danger to Others None reported or observed  Danger to Others Abnormal  Harmful Behavior to others No threats or harm toward other people  Destructive Behavior No threats or harm toward property

## 2022-06-23 NOTE — Plan of Care (Signed)
   Problem: Education: Goal: Emotional status will improve Outcome: Progressing Goal: Mental status will improve Outcome: Progressing   Problem: Coping: Goal: Ability to demonstrate self-control will improve Outcome: Progressing   

## 2022-06-23 NOTE — Progress Notes (Signed)
Patient ID: Jaclyn Owens, female   DOB: 12/11/1995, 27 y.o.   MRN: [id number]   Pt is observed anxious and irritable on the unit about still being in the hospital and saying she does not want to be on this hall or talk to patients at all. Pt was offered medication. Hydroxyzine 25 mg PRN given.
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
## 2022-06-23 NOTE — Progress Notes (Signed)
Pt stated she would like to speak to a supervisor, pt was informed that the person to help with her D/C would be the doctor due to that being the one in control of her leaving the hospital.. pt stated she has things she needs to do so she needed to be D/c from the hospital, pt was educated to discuss this with the doctor.

## 2022-06-23 NOTE — Plan of Care (Signed)
  Problem: Safety: Goal: Periods of time without injury will increase Outcome: Progressing   Problem: Education: Goal: Ability to state activities that reduce stress will improve Outcome: Not Progressing   Problem: Self-Concept: Goal: Level of anxiety will decrease Outcome: Progressing   Problem: Education: Goal: Will be free of psychotic symptoms Outcome: Progressing

## 2022-06-23 NOTE — Progress Notes (Signed)
D-Patient alert and oriented. Denies SI, HI, AVH, and pain. Patient stated that her goals are to be med compliant and to enroll in school at [employer] upon discharge. Patient reports that she prefers to limit interaction with other patients and focus on her treatment plan and "work on myself."   A- Scheduled medications administered to patient along with PRN hydroxyzine, per MAR. Support and encouragement provided.  Routine safety checks conducted every 15 minutes.  Patient informed to notify staff with problems or concerns.  R- No adverse drug reactions noted. Patient contracts for safety at this time. Patient compliant with medications and treatment plan. Patient receptive, calm, and cooperative.   Patient remains safe at this time.

## 2022-06-23 NOTE — Group Note (Signed)
Date:  06/23/2022 Time:  9:03 PM  Group Topic/Focus:  Wrap-Up Group:   The focus of this group is to help patients review their daily goal of treatment and discuss progress on daily workbooks.    Participation Level:  Did Not Attend   [name] 06/23/2022, 9:03 PM

## 2022-06-24 DIAGNOSIS — F19950 Other psychoactive substance use, unspecified with psychoactive substance-induced psychotic disorder with delusions: Secondary | ICD-10-CM

## 2022-06-24 NOTE — Progress Notes (Signed)
   06/24/22 1052  Psych Admission Type (Psych Patients Only)  Admission Status Involuntary  Psychosocial Assessment  Patient Complaints Anxiety;Irritability;Worrying;Isolation  Eye Contact Fair  Facial Expression Anxious  Affect Appropriate to circumstance  Speech Logical/coherent  Interaction Assertive;Cautious;Guarded  Motor Activity Restless  Appearance/Hygiene In scrubs  Behavior Characteristics Cooperative;Anxious  Thought Process  Coherency Circumstantial  Content Blaming others;Preoccupation  Delusions Paranoid  Perception Derealization  Hallucination None reported or observed  Judgment Impaired  Confusion None  Danger to Self  Current suicidal ideation? Denies  Self-Injurious Behavior No self-injurious ideation or behavior indicators observed or expressed   Agreement Not to Harm Self Yes  Description of Agreement verbal  Danger to Others  Danger to Others None reported or observed  Danger to Others Abnormal  Harmful Behavior to others No threats or harm toward other people  Destructive Behavior No threats or harm toward property

## 2022-06-24 NOTE — [facility] Suicide Risk Assessment (Signed)
[facility] INPATIENT:  Family/Significant Other Suicide Prevention Education  Suicide Prevention Education:  Patient Refusal for Family/Significant Other Suicide Prevention Education: The patient Jaclyn Owens has refused to provide written consent for family/significant other to be provided Family/Significant Other Suicide Prevention Education during admission and/or prior to discharge.  Physician notified.  Traniece Boffa A Demetrio Leighty 06/24/2022, 1:44 PM
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
## 2022-06-24 NOTE — Progress Notes (Signed)
   06/24/22 0600  15 Minute Checks  Location Dayroom  Visual Appearance Calm  Behavior Composed  Sleep (Behavioral Health Patients Only)  Calculate sleep? (Click Yes once per 24 hr at 0600 safety check) Yes  Documented sleep last 24 hours 7.75

## 2022-06-24 NOTE — Plan of Care (Signed)
   Problem: Education: Goal: Emotional status will improve Outcome: Progressing Goal: Mental status will improve Outcome: Progressing   Problem: Coping: Goal: Ability to demonstrate self-control will improve Outcome: Progressing   

## 2022-06-24 NOTE — Progress Notes (Signed)
D-Patient alert and oriented.  Denies SI, HI, AVH, and pain. Patient states that she is focused on her goals post discharge.  A- Scheduled medications administered to patient along with PRN hydroxyzine, per MAR. Support and encouragement provided.  Routine safety checks conducted every 15 minutes.  Patient informed to notify staff with problems or concerns.  R- No adverse drug reactions noted. Patient contracts for safety at this time. Patient compliant with medications and treatment plan. Patient receptive, calm, and cooperative. Patient interacts well with others on the unit.  Patient remains safe at this time.

## 2022-06-24 NOTE — Progress Notes (Signed)
Patient's mother, Aracely Kunsman, called requesting to speak with the patient and for updates. Ms. Piskor did not have the 4 digit code and is not listed on the consent form. Ms. Bina was notified that information could not be given due to these factors. Ms. Lurry then asked if she could pass along information regarding her daughter. She reports that the patient is calling her repeatedly and upsetting her and the patient's grandmother. Ms. Vandevoorde states, "She's possessed real bad. She's saying stuff that's not real." Ms. Kowaleski further states that "she switches on the phone. I'm recording our calls." Patient alerted to her mother's phone call and stated that she would not be providing the code or adding her to the consent form. "If I want to talk to her, I'll call her." Ms. Stillion left her phone number. [phone]
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
## 2022-06-24 NOTE — BHH Group Notes (Signed)
Adult Psychoeducational Group Note  Date:  06/24/2022 Time:  9:21 PM  Group Topic/Focus:  Wrap-Up Group:   The focus of this group is to help patients review their daily goal of treatment and discuss progress on daily workbooks.  Participation Level:  Active  Participation Quality:  Appropriate  Affect:  Appropriate  Cognitive:  Appropriate  Insight: Appropriate  Engagement in Group:  Engaged  Modes of Intervention:  Discussion and Support  Additional Comments:  Pt attended the evening group and responded to all discussion prompts from the Writer. Pt shared that today was a good day on the unit, the highlight of which was having clothes dropped off by her grandmother. On the subject of goals for the coming week, Pt mentioned wanting to eat healthier and discharge home. Pt rated her day an 8 out of 10.  Jaclyn Owens 06/24/2022, 9:21 PM
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
## 2022-06-25 LAB — COMPREHENSIVE METABOLIC PANEL
ALT: 27 U/L (ref 0–44)
AST: 27 U/L (ref 15–41)
Albumin: 3.8 g/dL (ref 3.5–5.0)
Alkaline Phosphatase: 15 U/L — ABNORMAL LOW (ref 38–126)
Anion gap: 10 (ref 5–15)
BUN: 9 mg/dL (ref 6–20)
CO2: 23 mmol/L (ref 22–32)
Calcium: 8.6 mg/dL — ABNORMAL LOW (ref 8.9–10.3)
Chloride: 105 mmol/L (ref 98–111)
Creatinine, Ser: 0.92 mg/dL (ref 0.44–1.00)
GFR, Estimated: >60 mL/min (ref >60)
Glucose, Bld: 157 mg/dL — ABNORMAL HIGH (ref 70–99)
Potassium: 3.7 mmol/L (ref 3.5–5.1)
Sodium: 138 mmol/L (ref 135–145)
Total Bilirubin: 0.4 mg/dL (ref 0.3–1.2)
Total Protein: 6.8 g/dL (ref 6.5–8.1)

## 2022-06-25 LAB — CBC WITH DIFFERENTIAL/PLATELET
Abs Immature Granulocytes: 0.01 10*3/uL (ref 0.00–0.07)
Basophils Absolute: 0 10*3/uL (ref 0.0–0.1)
Basophils Relative: 1 %
Eosinophils Absolute: 0.2 10*3/uL (ref 0.0–0.5)
Eosinophils Relative: 3 %
HCT: 39.3 % (ref 36.0–46.0)
Hemoglobin: 13 g/dL (ref 12.0–15.0)
Immature Granulocytes: 0 %
Lymphocytes Relative: 48 %
Lymphs Abs: 3 10*3/uL (ref 0.7–4.0)
MCH: 30.5 pg (ref 26.0–34.0)
MCHC: 33.1 g/dL (ref 30.0–36.0)
MCV: 92.3 fL (ref 80.0–100.0)
Monocytes Absolute: 0.4 10*3/uL (ref 0.1–1.0)
Monocytes Relative: 6 %
Neutro Abs: 2.6 10*3/uL (ref 1.7–7.7)
Neutrophils Relative %: 42 %
Platelets: 221 10*3/uL (ref 150–400)
RBC: 4.26 MIL/uL (ref 3.87–5.11)
RDW: 12.8 % (ref 11.5–15.5)
WBC: 6.2 10*3/uL (ref 4.0–10.5)
nRBC: 0 % (ref 0.0–0.2)

## 2022-06-25 MED ORDER — OLANZAPINE 5 MG PO TBDP
5.0000 mg | ORAL_TABLET | Freq: Every day | ORAL | Status: DC
  Administered 2022-06-26 – 2022-06-27 (×2): 5 mg via ORAL
  Filled 2022-06-25 (×4): qty 1

## 2022-06-25 MED ORDER — ENSURE ENLIVE PO LIQD
237.0000 mL | Freq: Two times a day (BID) | ORAL | Status: DC
  Administered 2022-06-25 – 2022-06-27 (×4): 237 mL via ORAL
  Filled 2022-06-25 (×6): qty 237

## 2022-06-25 MED ORDER — QUETIAPINE FUMARATE ER 50 MG PO TB24
100.0000 mg | ORAL_TABLET | Freq: Every day | ORAL | Status: DC
  Administered 2022-06-25 – 2022-06-26 (×2): 100 mg via ORAL
  Filled 2022-06-25 (×5): qty 2

## 2022-06-25 MED ORDER — TRAZODONE HCL 100 MG PO TABS
100.0000 mg | ORAL_TABLET | Freq: Every evening | ORAL | Status: DC | PRN
  Administered 2022-06-25 – 2022-06-26 (×2): 100 mg via ORAL
  Filled 2022-06-25 (×2): qty 1

## 2022-06-25 MED ORDER — OLANZAPINE 10 MG PO TBDP
10.0000 mg | ORAL_TABLET | Freq: Every day | ORAL | Status: DC
  Administered 2022-06-25 – 2022-06-26 (×2): 10 mg via ORAL
  Filled 2022-06-25 (×3): qty 1

## 2022-06-25 NOTE — Progress Notes (Signed)
   06/25/22 0551  15 Minute Checks  Location Hallway  Visual Appearance Calm  Behavior Composed  Sleep (Behavioral Health Patients Only)  Calculate sleep? (Click Yes once per 24 hr at 0600 safety check) Yes  Documented sleep last 24 hours 3.25

## 2022-06-25 NOTE — Plan of Care (Signed)
   Problem: Education: Goal: Emotional status will improve Outcome: Progressing Goal: Mental status will improve Outcome: Progressing   Problem: Coping: Goal: Ability to demonstrate self-control will improve Outcome: Progressing   

## 2022-06-25 NOTE — BHH Group Notes (Signed)
Adult Psychoeducational Group Note  Date:  06/25/2022 Time:  9:14 PM  Group Topic/Focus:  Wrap-Up Group:   The focus of this group is to help patients review their daily goal of treatment and discuss progress on daily workbooks.  Participation Level:  Active  Participation Quality:  Appropriate  Affect:  Appropriate  Cognitive:  Appropriate  Insight: Appropriate  Engagement in Group:  Developing/Improving  Modes of Intervention:  Discussion  Additional Comments:  Pt stated her goal for today was to focus on her treatment plan, talk with her doctor about her discharge plan and medication eduction. Pt stated she accomplished her goals today. Pt stated she talked with her doctor and with her social worker about her care today. Pt rated her overall day a 7 out of 10. Pt stated she was able to contact her grandmother today which improved her overall day. Pt stated she felt better about herself tonight. Pt stated she was able to attend all meals  today. Pt stated she took all medications provided today. Pt stated her appetite was pretty good today. Pt rated her sleep last night was pretty good. Pt stated the goal tonight was to get some rest. Pt stated she had no physical pain tonight. Pt deny visual hallucinations and auditory issues tonight. Pt denies thoughts of harming herself or others. Pt stated she would alert staff if anything changed.  Jaclyn Owens 06/25/2022, 9:14 PM

## 2022-06-25 NOTE — Progress Notes (Signed)
DAR NOTE: Patient presents with anxious affect and irritable mood.  Denies suicidal thoughts, auditory and visual hallucinations.  Patient preoccupied with getting discharge and was demanding to see the doctor.  Patient became angry when instructed about discharge plan process.  Stated, "I have been here long enough; I need to go home to my family." Rates depression at 0, hopelessness at 0, and anxiety at 3.  Maintained on routine safety checks.  Medications given as prescribed.  Support and encouragement offered as needed.  States goal for today is "to read my medication print out to see if the medication is helping me out."  Patient observed pacing the hallway with no interaction with peers.  Patient is safe on the unit.
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
## 2022-06-25 NOTE — Group Note (Signed)
Recreation Therapy Group Note   Group Topic:Other  Group Date: 06/25/2022 Start Time: 1030 End Time: 1106 Facilitators: Cherae Marton-McCall, LRT,CTRS Location: [address] Dayroom   Goal Area(s) Addresses:  Patient will work together to answer trivia questions.   Patient will be respectful of others throughout activity.  Group Description: Music Trivia.  Patients were partnered up compete in activity.  LRT read trivia questions that covered different styles, types and genres of music.  The team with the highest score wins the game.    Affect/Mood: N/A   Participation Level: Did not attend    Clinical Observations/Individualized Feedback:    Plan: Continue to engage patient in RT group sessions 2-3x/week.   Marcele Kosta-McCall, LRT,CTRS 06/25/2022 12:52 PM

## 2022-06-25 NOTE — Progress Notes (Signed)
   06/25/22 1930  Psych Admission Type (Psych Patients Only)  Admission Status Involuntary  Psychosocial Assessment  Patient Complaints Worrying;Irritability  Eye Contact Fair  Facial Expression Anxious  Affect Appropriate to circumstance  Speech Logical/coherent  Interaction Assertive  Motor Activity Other (Comment) (WDL)  Appearance/Hygiene Unremarkable  Behavior Characteristics Anxious  Mood Preoccupied;Anxious  Thought Process  Coherency Circumstantial  Content Preoccupation  Delusions Paranoid  Perception WDL  Hallucination None reported or observed  Judgment Impaired  Confusion None  Danger to Self  Current suicidal ideation? Denies  Self-Injurious Behavior No self-injurious ideation or behavior indicators observed or expressed   Agreement Not to Harm Self Yes  Description of Agreement Verbal  Danger to Others  Danger to Others None reported or observed  Danger to Others Abnormal  Harmful Behavior to others No threats or harm toward other people  Destructive Behavior No threats or harm toward property

## 2022-06-25 NOTE — [facility] Group Notes (Signed)
Adult Psychoeducational Group Note  Date:  06/25/2022 Time:  6:15 PM  Group Topic/Focus:  Goals Group:   The focus of this group is to help patients establish daily goals to achieve during treatment and discuss how the patient can incorporate goal setting into their daily lives to aide in recovery. Orientation:   The focus of this group is to educate the patient on the purpose and policies of crisis stabilization and provide a format to answer questions about their admission.  The group details unit policies and expectations of patients while admitted.  Participation Level:  Active  Participation Quality:  Appropriate  Affect:  Appropriate  Cognitive:  Appropriate  Insight: Appropriate  Engagement in Group:  Engaged  Modes of Intervention:  Discussion  Additional Comments:  Pt attended the goals group and remained appropriate and engaged throughout the duration of the group.   Sheran Lawless 06/25/2022, 6:15 PM

## 2022-06-25 NOTE — Progress Notes (Signed)
NUTRITION ASSESSMENT  RD consulted for nutritional assessment.  INTERVENTION: 1. Supplements: Ensure Plus High Protein po BID, each supplement provides 350 kcal and 20 grams of protein.   NUTRITION DIAGNOSIS: Unintentional weight loss related to sub-optimal intake as evidenced by pt report.   Goal: Pt to meet >/= 90% of their estimated nutrition needs.  Monitor:  PO intake  Assessment:  Pt admitted with drug psychosis. PMHx of bipolar disorder and anxiety. Patient reported to provider inconsistent diet and has had weight loss. Would like ensure supplements. Per weight records, pt has lost 16 lbs since May 2023 (11% wt loss x 1 year, insignificant for time frame).    Height: Ht Readings from Last 1 Encounters:  06/21/22 5\' 6"  (1.676 m)    Weight: Wt Readings from Last 1 Encounters:  06/21/22 55.2 kg    Weight Hx: Wt Readings from Last 10 Encounters:  06/21/22 55.2 kg  06/19/22 54.4 kg  06/16/22 56.7 kg  10/09/21 59.4 kg  06/07/21 62.6 kg  03/22/21 59 kg  02/20/21 59 kg  12/03/19 65.3 kg  02/17/19 79.4 kg  01/29/19 76.1 kg    BMI:  Body mass index is 19.64 kg/m. Pt meets criteria for normal based on current BMI.  Estimated Nutritional Needs: Kcal: 25-30 kcal/kg Protein: > 1 gram protein/kg Fluid: 1 ml/kcal  Diet Order:  Diet Order             Diet regular Room service appropriate? Yes; Fluid consistency: Thin  Diet effective now                  Pt is also offered choice of unit snacks mid-morning and mid-afternoon.    Lab results and medications reviewed.   Tilda Franco, MS, RD, LDN Inpatient Clinical Dietitian Contact information available via Amion

## 2022-06-26 NOTE — [facility] Group Notes (Signed)
Adult Psychoeducational Group Note  Date:  06/26/2022 Time:  9:12 PM  Group Topic/Focus:  Wrap-Up Group:   The focus of this group is to help patients review their daily goal of treatment and discuss progress on daily workbooks.  Participation Level:  Active  Participation Quality:  Appropriate  Affect:  Appropriate  Cognitive:  Appropriate  Insight: Appropriate  Engagement in Group:  Engaged  Modes of Intervention:  Discussion and Support  Additional Comments:  Pt attended the evening group and responded to all discussion prompts from the Writer. Pt shared that today was a good day on the unit, the highlight of which was talking to her grandmother and children on the phone. On the subject of ways to stay well upon discharge, Pt mentioned taking her medication and cutting toxic people out of her life. Pt rated her day an 8 out of 10.  Christ Kick 06/26/2022, 9:12 PM
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
## 2022-06-26 NOTE — Progress Notes (Signed)
   06/25/22 1930  Psych Admission Type (Psych Patients Only)  Admission Status Involuntary  Psychosocial Assessment  Patient Complaints Worrying;Irritability  Eye Contact Fair  Facial Expression Anxious  Affect Appropriate to circumstance  Speech Logical/coherent  Interaction Assertive  Motor Activity Other (Comment) (WDL)  Appearance/Hygiene Unremarkable  Behavior Characteristics Anxious  Mood Preoccupied;Anxious  Thought Process  Coherency Circumstantial  Content Preoccupation  Delusions Paranoid  Perception WDL  Hallucination None reported or observed  Judgment Impaired  Confusion None  Danger to Self  Current suicidal ideation? Denies  Self-Injurious Behavior No self-injurious ideation or behavior indicators observed or expressed   Agreement Not to Harm Self Yes  Description of Agreement Verbal  Danger to Others  Danger to Others None reported or observed  Danger to Others Abnormal  Harmful Behavior to others No threats or harm toward other people  Destructive Behavior No threats or harm toward property   Dar Note: Patient presents with anxious affect and irritable mood.  Denies suicidal thoughts, auditory and visual hallucinations.  Medications given as prescribed.  Routine safety checks maintained.  Patient is safe on and off the unit.  Support and encouragement offered as needed.

## 2022-06-26 NOTE — Group Note (Signed)
Recreation Therapy Group Note   Group Topic:Self-Esteem  Group Date: 06/26/2022 Start Time: 1036 End Time: 1110 Facilitators: Winfred Iiams-McCall, LRT,CTRS Location: [address] Dayroom   Goal Area(s) Addresses:  Patient will identify and write at least one positive trait about themself. Patient will successfully identify influential people in their life and why they admire them. Patient will acknowledge the benefit of healthy self-esteem. Patient will endorse understanding of ways to increase self-esteem.   Group Description: LRT began group session with open dialogue asking the patients to define self-esteem and verbally identify positive qualities and traits people may possess. Patients were then instructed to design a personalized license plate, with words and drawings, representing at least 3 positive things about themselves. Pts were encouraged to include favorites, things they are proud of, what they enjoy doing, and dreams for their future. If a patient had a life motto or a meaningful phase that expressed their life values, pt's were asked to incorporate that into their design as well. Patients were given the opportunity to share their completed work with the group.   Affect/Mood: Appropriate   Participation Level: Engaged   Participation Quality: Independent   Behavior: Appropriate   Speech/Thought Process: Focused   Insight: Good   Judgement: Good   Modes of Intervention: Art   Patient Response to Interventions:  Engaged   Education Outcome:  Acknowledges education   Clinical Observations/Individualized Feedback: Pt was engaged and focused during group session.  Pt expressed her nickname is "Jaclyn Owens".  Pt described herself as kind, smart, sweet, friendly and loving.  Pt favorite color is blue.  Pt also expressed loving all types of music, specifically for the beat.  Pt also loves any types of food except pork.  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  Pt likes to be outside, talented at doing lashes and dance.  Pt seemed very motivated and determined to reach the goals she has set for herself.      Plan: Continue to engage patient in RT group sessions 2-3x/week.   Keri Veale-McCall, LRT,CTRS 06/26/2022 1:01 PM

## 2022-06-26 NOTE — Plan of Care (Signed)
   Problem: Education: Goal: Emotional status will improve Outcome: Progressing Goal: Mental status will improve Outcome: Progressing   Problem: Coping: Goal: Ability to demonstrate self-control will improve Outcome: Progressing   

## 2022-06-26 NOTE — Progress Notes (Signed)
   06/26/22 1940  Psych Admission Type (Psych Patients Only)  Admission Status Involuntary  Psychosocial Assessment  Patient Complaints Worrying  Eye Contact Fair  Facial Expression Anxious  Affect Appropriate to circumstance  Speech Logical/coherent  Interaction Assertive  Motor Activity Other (Comment) (WDL)  Appearance/Hygiene Unremarkable  Behavior Characteristics Anxious  Mood Anxious  Thought Process  Coherency Circumstantial  Content Preoccupation  Delusions Paranoid  Perception WDL  Hallucination None reported or observed  Judgment Impaired  Confusion None  Danger to Self  Current suicidal ideation? Denies  Self-Injurious Behavior No self-injurious ideation or behavior indicators observed or expressed   Agreement Not to Harm Self Yes  Description of Agreement Verbal  Danger to Others  Danger to Others None reported or observed  Danger to Others Abnormal  Harmful Behavior to others No threats or harm toward other people  Destructive Behavior No threats or harm toward property

## 2022-06-26 NOTE — Progress Notes (Signed)
   06/26/22 0557  15 Minute Checks  Location Bedroom  Visual Appearance Calm  Behavior Sleeping  Sleep (Behavioral Health Patients Only)  Calculate sleep? (Click Yes once per 24 hr at 0600 safety check) Yes  Documented sleep last 24 hours 8.5

## 2022-06-26 NOTE — Progress Notes (Signed)
With pt's consent, I called: Kevan Rosebush [phone] -  We discussed the pt's diagnosis, hospital course, treatment, response to treatment, and discharge planning. She states that pt is doing well, at baseline, and has no concerns for discharge of the patient other than the patient needing to stay on her psychiatric medications. At the end of the call, the caller had no further questions.

## 2022-06-26 NOTE — B[facility] Group Notes (Signed)
Adult Psychoeducational Group Note  Date:  06/26/2022 Time:  6:31 PM  Group Topic/Focus:  Goals Group:   The focus of this group is to help patients establish daily goals to achieve during treatment and discuss how the patient can incorporate goal setting into their daily lives to aide in recovery. Orientation:   The focus of this group is to educate the patient on the purpose and policies of crisis stabilization and provide a format to answer questions about their admission.  The group details unit policies and expectations of patients while admitted.  Participation Level:  Did Not Attend  Participation Quality:    Affect:    Cognitive:    Insight:   Engagement in Group:    Modes of Intervention:    Additional Comments:    Sheran Lawless 06/26/2022, 6:31 PM

## 2022-06-27 LAB — LIPID PANEL
Cholesterol: 124 mg/dL (ref 0–200)
HDL: 52 mg/dL (ref >40)
LDL Cholesterol: 64 mg/dL (ref 0–99)
Total CHOL/HDL Ratio: 2.4 RATIO
Triglycerides: 40 mg/dL (ref <150)
VLDL: 8 mg/dL (ref 0–40)

## 2022-06-27 MED ORDER — OLANZAPINE 5 MG PO TABS
5.0000 mg | ORAL_TABLET | Freq: Every day | ORAL | Status: DC
  Filled 2022-06-27 (×2): qty 1

## 2022-06-27 MED ORDER — QUETIAPINE FUMARATE ER 50 MG PO TB24: 100.0000 mg | ORAL_TABLET | Freq: Every day | ORAL | 0 refills | Status: DC

## 2022-06-27 MED ORDER — OLANZAPINE 10 MG PO TABS: 10.0000 mg | ORAL_TABLET | Freq: Every day | ORAL | 0 refills | Status: DC

## 2022-06-27 MED ORDER — OLANZAPINE 10 MG PO TABS
10.0000 mg | ORAL_TABLET | Freq: Every day | ORAL | Status: DC
  Filled 2022-06-27: qty 1

## 2022-06-27 MED ORDER — TRAZODONE HCL 100 MG PO TABS: 100.0000 mg | ORAL_TABLET | Freq: Every evening | ORAL | 0 refills | Status: DC | PRN

## 2022-06-27 MED ORDER — OLANZAPINE 5 MG PO TABS: 5.0000 mg | ORAL_TABLET | Freq: Every day | ORAL | 0 refills | Status: DC

## 2022-06-27 MED ORDER — HYDROXYZINE HCL 25 MG PO TABS: 25.0000 mg | ORAL_TABLET | Freq: Three times a day (TID) | ORAL | 0 refills | Status: DC | PRN
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
## 2022-06-27 NOTE — Progress Notes (Signed)
  [facility] Adult Case Management Discharge Plan :  Will you be returning to the same living situation after discharge:  Yes,  home  At discharge, do you have transportation home?: Yes,  grandmother will pick patient up Do you have the ability to pay for your medications: Yes,  insurance  Release of information consent forms completed and in the chart;  Patient's signature needed at discharge.  Patient to Follow up at:  Follow-up Information     [facility] Follow up.   Specialty: Behavioral Health Why: Please go to this provider during walk in hours Monday through Friday from 8am-11am for med management and therapy. We recommend you arriving at this location at 7:20am as it is a first come first serve basis. Contact information: [address] [place] [address] [phone]                Next level of care provider has access to [facility] Link:yes  Safety Planning and Suicide Prevention discussed: Yes,  with patient, doctor was able to speak to patient grandmother, patient declined consents for CSW     Has patient been referred to the Quitline?: Patient refused referral for treatment  Patient has been referred for addiction treatment: Patient refused referral for treatment.  Rolfe Hartsell E Tajee Savant, LCSW 06/27/2022, 10:20 AM

## 2022-06-27 NOTE — Group Note (Signed)
Recreation Therapy Group Note   Group Topic:Stress Management  Group Date: 06/27/2022 Start Time: 1030 End Time: 1055 Facilitators: Pavlos Yon-McCall, LRT,CTRS Location: [address] Dayroom   Goal Area(s) Addresses:  Patient will identify positive stress management techniques. Patient will identify benefits of using stress management post d/c.  Group Description: Meditation.  LRT and patients discussed the importance of stress management.  Patients also discussed how mental, physical and the spiritual elements work together to bring balance to the body.  LRT then played a morning meditation that helped to get individuals focused on their day and get them in the mental space to face whatever may come.    Affect/Mood: N/A   Participation Level: Did not attend    Clinical Observations/Individualized Feedback:     Plan: Continue to engage patient in RT group sessions 2-3x/week.   Shawne Eskelson-McCall, LRT,CTRS 06/27/2022 11:21 AM

## 2022-06-27 NOTE — Progress Notes (Signed)
   06/27/22 1610  15 Minute Checks  Location Bedroom  Visual Appearance Calm  Behavior Composed  Sleep (Behavioral Health Patients Only)  Calculate sleep? (Click Yes once per 24 hr at 0600 safety check) Yes  Documented sleep last 24 hours 5.75

## 2022-06-27 NOTE — Progress Notes (Signed)
Patient ID: Jaclyn Owens, female   DOB: [birth year], 27 y.o.   MRN: [id number]   Pt ambulatory, alert, and oriented X4 on and off the unit. Education, support, and encouragement provided. Discharge summary/AVS, prescriptions, medications, and follow up appointments reviewed with pt and a copy of the AVS was given to pt. Medications "next dose" was also reviewed with pt and marked/notated on pt's med list on AVS. Suicide safety plan completed, reviewed with this RN, given to the patient, and a copy was placed in the chart. Suicide prevention resources also provided. Pt's belongings in locker #7 returned and belongings sheet signed. Pt denies SI/HI (plan and intention), and AVH. Pt denies any concerns at this time. Pt discharged to lobby to her grandmother.
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
## 2022-06-27 NOTE — Discharge Instructions (Signed)
-  Follow-up with your outpatient psychiatric provider -instructions on appointment date, time, and address (location) are provided to you in discharge paperwork.  -Take your psychiatric medications as prescribed at discharge - instructions are provided to you in the discharge paperwork  -Follow-up with outpatient primary care doctor and other specialists -for management of preventative medicine and any chronic medical disease.  -Recommend abstinence from alcohol, tobacco, and other illicit drug use at discharge.   -If your psychiatric symptoms recur, worsen, or if you have side effects to your psychiatric medications, call your outpatient psychiatric provider, 911, 988 or go to the nearest emergency department.  -If suicidal thoughts occur, call your outpatient psychiatric provider, 911, 988 or go to the nearest emergency department.  Naloxone (Narcan) can help reverse an overdose when given to the victim quickly.  [place] offers free naloxone kits and instructions/training on its use.  Add naloxone to your first aid kit and you can help save a life.   Pick up your free kit at the following locations:   [place]:  [facility], [address] [place] [address] ([phone]) [facility] [address] [place] [id number] ([phone]) [facility] [address] [place] [address]  [place]: [facility] [address] [place] [address] ([phone]) [facility] [address] [place] [address] ([phone])  

## 2022-06-28 LAB — HEMOGLOBIN A1C
Hgb A1c MFr Bld: 5.7 % — ABNORMAL HIGH (ref 4.8–5.6)
Mean Plasma Glucose: 117 mg/dL

## 2022-06-28 NOTE — Discharge Summary (Signed)
Physician Discharge Summary Note  Patient:  Jaclyn Owens is an 27 y.o., female MRN:  [id number] DOB:  1995/12/08 Patient phone:  [phone] (home)  Patient address:   [address] [place] [address],  Total Time spent with patient: 30 minutes  Date of Admission:  06/19/2022 Date of Discharge: 5 3324  Reason for Admission: Patient was admitted after presentation with symptoms of agitated mania bizarre behavior in public endangering herself and her children.  Principal Problem: Severe manic bipolar 1 disorder with psychotic behavior [facility]) Discharge Diagnoses: Principal Problem:   Severe manic bipolar 1 disorder with psychotic behavior (HCC)   Past Psychiatric History: Patient has a history of bipolar disorder evidently with both severe depression and mania in the past.  Past Medical History:  Past Medical History:  Diagnosis Date   Asthma    HSV (herpes simplex virus) infection     Past Surgical History:  Procedure Laterality Date   ARM WOUND REPAIR / CLOSURE     Family History:  Family History  Problem Relation Age of Onset   Asthma Mother    Diabetes Maternal Aunt    Cancer Maternal Grandmother    Family Psychiatric  History: None reported Social History:  Social History   Substance and Sexual Activity  Alcohol Use Not Currently   Comment: UTA, pt is not cooperative with triage questions 06/16/22     Social History   Substance and Sexual Activity  Drug Use Yes   Types: Marijuana    Social History   Socioeconomic History   Marital status: Single    Spouse name: Not on file   Number of children: 1   Years of education: 13   Highest education level: High school graduate  Occupational History   Not on file  Tobacco Use   Smoking status: Former    Types: Cigarettes, Cigars   Smokeless tobacco: Never  Vaping Use   Vaping Use: Former  Substance and Sexual Activity   Alcohol use: Not Currently    Comment: UTA, pt is not cooperative with  triage questions 06/16/22   Drug use: Yes    Types: Marijuana   Sexual activity: Yes    Birth control/protection: None  Other Topics Concern   Not on file  Social History Narrative   Not on file   Social Determinants of Health   Financial Resource Strain: Low Risk  (01/10/2018)   Overall Financial Resource Strain (CARDIA)    Difficulty of Paying Living Expenses: Not very hard  Food Insecurity: No Food Insecurity (06/21/2022)   Hunger Vital Sign    Worried About Running Out of Food in the Last Year: Never true    Ran Out of Food in the Last Year: Never true  Transportation Needs: No Transportation Needs (06/21/2022)   PRAPARE - Administrator, Civil Service (Medical): No    Lack of Transportation (Non-Medical): No  Physical Activity: Inactive (01/10/2018)   Exercise Vital Sign    Days of Exercise per Week: 0 days    Minutes of Exercise per Session: 0 min  Stress: No Stress Concern Present (01/10/2018)   [employer] of Occupational Health - Occupational Stress Questionnaire    Feeling of Stress : Not at all  Social Connections: Somewhat Isolated (01/10/2018)   Social Connection and Isolation Panel [NHANES]    Frequency of Communication with Friends and Family: More than three times a week    Frequency of Social Gatherings with Friends and Family: More than  three times a week    Attends Religious Services: More than 4 times per year    Active Member of Clubs or Organizations: No    Attends Banker Meetings: Never    Marital Status: Never married    Hospital Course: Admitted to psychiatric unit.  Patient was agitated hyperverbal hyper religious disorganized and showing multiple symptoms consistent with mania.  Her insight was limited and she was difficult to work with because of the intensity of her mania.  Very difficult difficult to communicate with.  Attempts were made to start her on appropriate medication.  She was largely resistant to this and very  argumentative.  Patient made a request to be transferred to another facility.  I made this request immediately to the nurse doing the assignments and was told that there were no beds [facility].  Later in the day the patient had become more agitated and at 1 point required a very brief hold.  Nursing supervisor for behavioral health at  intervened and arranged for transfer to behavioral health Hospital in [place] for further care.  Physical Findings: AIMS:  , ,  ,  ,    CIWA:    COWS:     Musculoskeletal: Strength & Muscle Tone: within normal limits Gait & Station: normal Patient leans: N/A   Psychiatric Specialty Exam:  Presentation  General Appearance:  Appropriate for Environment; Casual; Fairly Groomed  Eye Contact: Good  Speech: Normal Rate; Clear and Coherent  Speech Volume: Normal  Handedness:No data recorded  Mood and Affect  Mood: Euthymic  Affect: Appropriate; Congruent; Full Range   Thought Process  Thought Processes: Linear  Descriptions of Associations:Intact  Orientation:Full (Time, Place and Person)  Thought Content:Logical  History of Schizophrenia/Schizoaffective disorder:No  Duration of Psychotic Symptoms:Less than six months  Hallucinations:Hallucinations: None  Ideas of Reference:None  Suicidal Thoughts:Suicidal Thoughts: No  Homicidal Thoughts:Homicidal Thoughts: No   Sensorium  Memory: Immediate Good; Recent Good; Remote Good  Judgment: Good  Insight: Good   Executive Functions  Concentration: Good  Attention Span: Good  Recall: Good  Fund of Knowledge: Good  Language: Good   Psychomotor Activity  Psychomotor Activity: Psychomotor Activity: Normal   Assets  Assets: Resilience; Physical Health   Sleep  Sleep: Sleep: Fair    Physical Exam: Physical Exam Vitals reviewed.  Constitutional:      Appearance: Normal appearance.  HENT:     Head:  Normocephalic and atraumatic.     Mouth/Throat:     Pharynx: Oropharynx is clear.  Eyes:     Pupils: Pupils are equal, round, and reactive to light.  Cardiovascular:     Rate and Rhythm: Normal rate and regular rhythm.  Pulmonary:     Effort: Pulmonary effort is normal.     Breath sounds: Normal breath sounds.  Abdominal:     General: Abdomen is flat.     Palpations: Abdomen is soft.  Musculoskeletal:        General: Normal range of motion.  Skin:    General: Skin is warm and dry.  Neurological:     General: No focal deficit present.     Mental Status: She is alert. Mental status is at baseline.  Psychiatric:        Attention and Perception: She is inattentive.        Mood and Affect: Mood is elated. Affect is angry.        Speech: Speech is rapid and pressured and tangential.  Behavior: Behavior is agitated.        Thought Content: Thought content is paranoid.        Cognition and Memory: Cognition is impaired.        Judgment: Judgment is inappropriate.    Review of Systems  Constitutional: Negative.   HENT: Negative.    Eyes: Negative.   Respiratory: Negative.    Cardiovascular: Negative.   Gastrointestinal: Negative.   Musculoskeletal: Negative.   Skin: Negative.   Neurological: Negative.   Psychiatric/Behavioral:  The patient is nervous/anxious.    Blood pressure 95/69, pulse 76, temperature 98.3 F (36.8 C), temperature source Oral, resp. rate 18, height 5\' 7"  (1.702 m), weight 54.4 kg, SpO2 98 %. Body mass index is 18.79 kg/m.   Social History   Tobacco Use  Smoking Status Former   Types: Cigarettes, Cigars  Smokeless Tobacco Never   Tobacco Cessation:  A prescription for an FDA-approved tobacco cessation medication was offered at discharge and the patient refused   Blood Alcohol level:  Lab Results  Component Value Date   [facility] <10 06/16/2022   ETH <10 02/24/2017    Metabolic Disorder Labs:  Lab Results  Component Value Date   HGBA1C 5.7  (H) 06/27/2022   MPG 117 06/27/2022   No results found for: "PROLACTIN" Lab Results  Component Value Date   CHOL 124 06/27/2022   TRIG 40 06/27/2022   HDL 52 06/27/2022   CHOLHDL 2.4 06/27/2022   VLDL 8 06/27/2022   LDLCALC 64 06/27/2022    See Psychiatric Specialty Exam and Suicide Risk Assessment completed by Attending Physician prior to discharge.  Discharge destination:  Other:  Transfer to behavioral health Hospital in [place]  Is patient on multiple antipsychotic therapies at discharge:  No   Has Patient had three or more failed trials of antipsychotic monotherapy by history:  No  Recommended Plan for Multiple Antipsychotic Therapies: NA  Discharge Instructions     Diet - low sodium heart healthy   Complete by: As directed    Increase activity slowly   Complete by: As directed       Allergies as of 06/21/2022       Reactions   Bee Venom Anaphylaxis        Medication List     STOP taking these medications    Cetirizine HCl 10 MG Caps   fluconazole 150 MG tablet Commonly known as: DIFLUCAN   FLUoxetine 10 MG capsule Commonly known as: PROZAC   levocetirizine 5 MG tablet Commonly known as: XYZAL   lidocaine 5 % Commonly known as: Lidoderm   metroNIDAZOLE 500 MG tablet Commonly known as: FLAGYL   naproxen 375 MG tablet Commonly known as: NAPROSYN   QUEtiapine 100 MG tablet Commonly known as: SEROQUEL         Follow-up recommendations:  Other:  Follow-up with inpatient care in [place]  Comments: See above.  Still under IVC and transported that way to [facility].  Signed: Mordecai Rasmussen, MD 06/28/2022, 3:19 PM

## 2022-07-13 ENCOUNTER — Ambulatory Visit
Admission: EM | Admit: 2022-07-13 | Discharge: 2022-07-13 | Disposition: A | Discharge status: Home / Self Care | Payer: Medicaid Other | Attending: Nurse Practitioner | Admitting: Nurse Practitioner

## 2022-07-13 DIAGNOSIS — Z113 Encounter for screening for infections with a predominantly sexual mode of transmission: Secondary | ICD-10-CM

## 2022-07-13 DIAGNOSIS — R82998 Other abnormal findings in urine: Secondary | ICD-10-CM

## 2022-07-13 DIAGNOSIS — A6000 Herpesviral infection of urogenital system, unspecified: Secondary | ICD-10-CM

## 2022-07-13 LAB — POCT URINALYSIS DIP (MANUAL ENTRY)
Bilirubin, UA: NEGATIVE
Blood, UA: NEGATIVE
Glucose, UA: NEGATIVE mg/dL
Ketones, POC UA: NEGATIVE mg/dL
Nitrite, UA: NEGATIVE
Protein Ur, POC: NEGATIVE mg/dL
Spec Grav, UA: >=1.03 — AB (ref 1.010–1.025)
Urobilinogen, UA: 0.2 E.U./dL
pH, UA: 6.5 (ref 5.0–8.0)

## 2022-07-13 LAB — POCT URINE PREGNANCY: Preg Test, Ur: NEGATIVE

## 2022-07-13 MED ORDER — VALACYCLOVIR HCL 1 G PO TABS
1000.0000 mg | ORAL_TABLET | Freq: Two times a day (BID) | ORAL | 0 refills | Status: AC
Start: 2022-07-13 — End: 2022-07-20

## 2022-07-13 NOTE — Discharge Instructions (Signed)
The clinic will contact you with results once available of the testing done today if it is positive I have refilled your Valtrex Follow-up with your PCP as needed Please go to the ER if you develop any new or worsening symptoms

## 2022-07-13 NOTE — ED Triage Notes (Signed)
Pt present for STD testing, states she had unprotected sex.   Denies any sxs. She is requesting HIV testing.

## 2022-07-13 NOTE — ED Provider Notes (Signed)
[facility]    CSN: [id number] Arrival date & time: 07/13/22  1500      History   Chief Complaint Chief Complaint  Patient presents with   Exposure to STD    HPI Jaclyn Owens is a 27 y.o. female Modena Jansky for STD testing.  Patient would like STD screening and denies any symptoms including dysuria, vaginal discharge, fevers or chills.  No STD exposure but she has had unprotected intercourse.  In addition she is requesting a refill on her Valtrex and states she is having a current genital herpes outbreak.  Denies any other concerns at this time.   Exposure to STD    Past Medical History:  Diagnosis Date   Asthma    HSV (herpes simplex virus) infection     Patient Active Problem List   Diagnosis Date Noted   Adjustment disorder with mixed anxiety and depressed mood 06/19/2022   Severe manic bipolar 1 disorder with psychotic behavior (HCC) 06/19/2022   Possible exposure to STD 10/09/2021   Indication for care in labor or delivery 02/16/2019   Gestational diabetes 12/31/2018   Short interval between pregnancies affecting pregnancy, antepartum 08/21/2018   Supervision of other normal pregnancy, antepartum 08/29/2017   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 Authorizing Provider  valACYclovir (VALTREX) 1000 MG tablet Take 1 tablet (1,000 mg total) by mouth 2 (two) times daily for 7 days. 07/13/22 07/20/22 Yes Radford Pax, NP  hydrOXYzine (ATARAX) 25 MG tablet Take 1 tablet (25 mg total) by mouth 3 (three) times daily as needed for  anxiety. 06/27/22   Massengill, Harrold Donath, MD  OLANZapine (ZYPREXA) 10 MG tablet Take 1 tablet (10 mg total) by mouth at bedtime. 06/28/22 07/28/22  Massengill, Harrold Donath, MD  OLANZapine (ZYPREXA) 5 MG tablet Take 1 tablet (5 mg total) by mouth daily. 06/28/22 07/28/22  Massengill, Harrold Donath, MD  QUEtiapine (SEROQUEL XR) 50 MG TB24 24 hr tablet Take 2 tablets (100 mg total) by mouth at bedtime. 06/27/22 07/27/22  Massengill, Harrold Donath, MD  traZODone (DESYREL) 100 MG tablet Take 1 tablet (100 mg total) by mouth at bedtime as needed for sleep. 06/27/22   Massengill, Harrold Donath, MD    Family History Family History  Problem Relation Age of Onset   Asthma Mother    Diabetes Maternal Aunt    Cancer Maternal Grandmother     Social History Social History   Tobacco Use   Smoking status: Former    Types: Cigarettes, Cigars   Smokeless tobacco: Never  Vaping Use   Vaping Use: Former  Substance Use Topics   Alcohol use: Not Currently    Comment: UTA, pt is not cooperative with triage questions 06/16/22   Drug use: Yes    Types: Marijuana     Allergies   Bee venom  Review of Systems Review of Systems  Genitourinary:        STD screening     Physical Exam Triage Vital Signs ED Triage Vitals [07/13/22 1551]  Enc Vitals Group     BP 107/73     Pulse Rate 90     Resp 18     Temp 98.5 F (36.9 C)     Temp Source Oral     SpO2 98 %     Weight      Height      Head Circumference      Peak Flow      Pain Score 0     Pain Loc      Pain Edu?      Excl. in GC?    No data found.  Updated Vital Signs BP 107/73 (BP Location: Right Arm)   Pulse 90   Temp 98.5 F (36.9 C) (Oral)   Resp 18   LMP 06/25/2022 (Exact Date)   SpO2 98%   Visual Acuity Right Eye Distance:   Left Eye Distance:   Bilateral Distance:    Right Eye Near:   Left Eye Near:    Bilateral Near:     Physical Exam Vitals and nursing note reviewed.  Constitutional:      Appearance: Normal appearance.  HENT:     Head:  Normocephalic and atraumatic.  Eyes:     Pupils: Pupils are equal, round, and reactive to light.  Cardiovascular:     Rate and Rhythm: Normal rate.  Pulmonary:     Effort: Pulmonary effort is normal.  Abdominal:     Tenderness: There is no right CVA tenderness or left CVA tenderness.  Skin:    General: Skin is warm and dry.  Neurological:     General: No focal deficit present.     Mental Status: She is alert and oriented to person, place, and time.  Psychiatric:        Mood and Affect: Mood normal.        Behavior: Behavior normal.      UC Treatments / Results  Labs (all labs ordered are listed, but only abnormal results are displayed) Labs Reviewed  POCT URINALYSIS DIP (MANUAL ENTRY) - Abnormal; Notable for the following components:      Result Value   Spec Grav, UA >=1.030 (*)    Leukocytes, UA Trace (*)    All other components within normal limits  URINE CULTURE  RPR  HIV ANTIBODY (ROUTINE TESTING W REFLEX)  POCT URINE PREGNANCY  CERVICOVAGINAL ANCILLARY ONLY    EKG   Radiology No results found.  Procedures Procedures (including critical care time)  Medications Ordered in UC Medications - No data to display  Initial Impression / Assessment and Plan / UC Course  I have reviewed the triage vital signs and the nursing notes.  Pertinent labs & imaging results that were available during my care of the patient were reviewed by me and considered in my medical decision making (see chart for details).     STD testing is ordered.  Will send urine culture is trace leuks and will await results prior to initiating treatment as patient is asymptomatic Did refill Valtrex at patient request PCP follow-up as needed ER precautions reviewed Final Clinical Impressions(s) / UC Diagnoses   Final diagnoses:  Screening examination for STD (sexually transmitted disease)  Leukocytes in urine  Genital herpes simplex, unspecified site     Discharge Instructions  The  clinic will contact you with results once available of the testing done today if it is positive I have refilled your Valtrex Follow-up with your PCP as needed Please go to the ER if you develop any new or worsening symptoms   ED Prescriptions     Medication Sig Dispense Auth. Provider   valACYclovir (VALTREX) 1000 MG tablet Take 1 tablet (1,000 mg total) by mouth 2 (two) times daily for 7 days. 14 tablet Radford Pax, NP      PDMP not reviewed this encounter.   Radford Pax, NP 07/13/22 [phone]

## 2022-07-14 LAB — HIV ANTIBODY (ROUTINE TESTING W REFLEX): HIV Screen 4th Generation wRfx: NONREACTIVE

## 2022-07-14 LAB — RPR: RPR Ser Ql: NONREACTIVE

## 2022-07-15 LAB — URINE CULTURE: Culture: <10000 — AB

## 2022-07-16 LAB — CERVICOVAGINAL ANCILLARY ONLY
Bacterial Vaginitis (gardnerella): POSITIVE — AB
Candida Glabrata: NEGATIVE
Candida Vaginitis: NEGATIVE
Chlamydia: NEGATIVE
Comment: NEGATIVE
Comment: NEGATIVE
Comment: NEGATIVE
Comment: NEGATIVE
Comment: NEGATIVE
Comment: NORMAL
Neisseria Gonorrhea: NEGATIVE
Trichomonas: POSITIVE — AB

## 2022-07-17 ENCOUNTER — Telehealth: Payer: Self-pay | Admitting: Emergency Medicine

## 2022-07-17 MED ORDER — METRONIDAZOLE 500 MG PO TABS
500.0000 mg | ORAL_TABLET | Freq: Two times a day (BID) | ORAL | 0 refills | Status: AC
Start: 2022-07-17 — End: ?

## 2022-07-17 NOTE — Telephone Encounter (Signed)
Reviewed with patient, verified pharmacy, prescription sent. All questions answered.

## 2022-09-22 ENCOUNTER — Ambulatory Visit
Admission: EM | Admit: 2022-09-22 | Discharge: 2022-09-22 | Disposition: A | Discharge status: Home / Self Care | Payer: MEDICAID | Attending: Internal Medicine | Admitting: Internal Medicine

## 2022-09-22 DIAGNOSIS — Z113 Encounter for screening for infections with a predominantly sexual mode of transmission: Secondary | ICD-10-CM | POA: Diagnosis not present

## 2022-09-22 DIAGNOSIS — H60502 Unspecified acute noninfective otitis externa, left ear: Secondary | ICD-10-CM

## 2022-09-22 MED ORDER — NEOMYCIN-POLYMYXIN-HC 3.5-10000-1 OT SUSP
4.0000 [drp] | Freq: Three times a day (TID) | OTIC | 0 refills | Status: AC
Start: 2022-09-22 — End: 2022-09-29

## 2022-09-22 NOTE — ED Provider Notes (Signed)
[facility]    CSN: [id number] Arrival date & time: 09/22/22  0935      History   Chief Complaint No chief complaint on file.   HPI Jaclyn Owens is a 27 y.o. female presents for STD screening.  Patient currently denies any symptoms including dysuria, vaginal discharge, fevers, nausea/vomiting, flank pain.  No known STD exposure.  In addition she reports a few days of left ear pain with drainage.  Denies any fevers, URI symptoms.  States she does get a lot of water in the ear when she showers.  Reports she has had this in the past.  No change in hearing.  No OTC medications have been used for this.  No other concerns at this time.  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 Authorizing Provider  neomycin-polymyxin-hydrocortisone (CORTISPORIN) 3.5-10000-1 OTIC suspension Place 4 drops into the left ear 3 (three) times daily for 7 days. 09/22/22 09/29/22 Yes Radford Pax, NP   hydrOXYzine (ATARAX) 25 MG tablet Take 1 tablet (25 mg total) by mouth 3 (three) times daily as needed for anxiety. 06/27/22   Massengill, Harrold Donath, MD  metroNIDAZOLE (FLAGYL) 500 MG tablet Take 1 tablet (500 mg total) by mouth 2 (two) times daily. 07/17/22   Lamptey, Britta Mccreedy, MD  OLANZapine (ZYPREXA) 10 MG tablet Take 1 tablet (10 mg total) by mouth at bedtime. 06/28/22 07/28/22  Massengill, Harrold Donath, MD  OLANZapine (ZYPREXA) 5 MG tablet Take 1 tablet (5 mg total) by mouth daily. 06/28/22 07/28/22  Massengill, Harrold Donath, MD  QUEtiapine (SEROQUEL XR) 50 MG TB24 24 hr tablet Take 2 tablets (100 mg total) by mouth at bedtime. 06/27/22 07/27/22  Massengill, Harrold Donath, MD  traZODone (DESYREL) 100 MG tablet Take 1 tablet (100 mg total) by mouth at bedtime as needed for sleep. 06/27/22   Massengill, Harrold Donath, MD    Family History Family History  Problem Relation Age of Onset   Asthma Mother    Diabetes Maternal Aunt    Cancer Maternal Grandmother     Social History Social History   Tobacco Use   Smoking status: Former    Types: Cigarettes, Cigars   Smokeless tobacco: Never  Vaping Use   Vaping status: Former  Retail buyer  Topics   Alcohol use: Not Currently    Comment: UTA, pt is not cooperative with triage questions 06/16/22   Drug use: Yes    Types: Marijuana     Allergies   Bee venom   Review of Systems Review of Systems  HENT:  Positive for ear discharge and ear pain.   Genitourinary:        STD screening     Physical Exam Triage Vital Signs ED Triage Vitals  Encounter Vitals Group     BP 09/22/22 0944 118/74     Systolic BP Percentile --      Diastolic BP Percentile --      Pulse Rate 09/22/22 0944 85     Resp 09/22/22 0944 16     Temp 09/22/22 0944 98.8 F (37.1 C)     Temp Source 09/22/22 0944 Oral     SpO2 09/22/22 0944 98 %     Weight --      Height --      Head Circumference --      Peak Flow --      Pain Score 09/22/22 0948 0     Pain Loc --      Pain Education --       Exclude from Growth Chart --    No data found.  Updated Vital Signs BP 118/74 (BP Location: Right Arm)   Pulse 85   Temp 98.8 F (37.1 C) (Oral)   Resp 16   LMP 09/09/2022 (Exact Date)   SpO2 98%   Visual Acuity Right Eye Distance:   Left Eye Distance:   Bilateral Distance:    Right Eye Near:   Left Eye Near:    Bilateral Near:     Physical Exam Vitals and nursing note reviewed.  Constitutional:      General: She is not in acute distress.    Appearance: Normal appearance. She is not ill-appearing.  HENT:     Head: Normocephalic and atraumatic.     Right Ear: Tympanic membrane and ear canal normal.     Left Ear: Tympanic membrane normal. Drainage and swelling present.     Ears:     Comments: Mild swelling of the left ear canal with scant mucopurulent drainage noted.  TM visible and is intact without erythema. Eyes:     Pupils: Pupils are equal, round, and reactive to light.  Cardiovascular:     Rate and Rhythm: Normal rate.  Pulmonary:     Effort: Pulmonary effort is normal.  Abdominal:     Tenderness: There is no right CVA tenderness or left CVA tenderness.  Skin:    General: Skin is warm and dry.  Neurological:     General: No focal deficit present.     Mental Status: She is alert and oriented to person, place, and time.  Psychiatric:        Mood and Affect: Mood normal.        Behavior: Behavior normal.      UC Treatments / Results  Labs (all labs ordered are listed, but only abnormal results are displayed) Labs Reviewed  RPR  HIV ANTIBODY (ROUTINE TESTING W REFLEX)  CERVICOVAGINAL ANCILLARY ONLY    EKG   Radiology No results found.  Procedures Procedures (including critical care time)  Medications Ordered in UC Medications - No data to display  Initial Impression / Assessment and Plan / UC Course  I have reviewed the triage vital signs and the nursing notes.  Pertinent  labs & imaging results that were available during my care of the  patient were reviewed by me and considered in my medical decision making (see chart for details).     STD testing is ordered and will contact for any positive results.  Reviewed otitis externa with patient.  Start Cortisporin eardrops as prescribed.  Patient instructed to keep water out of the ear until treatment is complete.  PCP follow-up if symptoms do not improve.  ER precautions reviewed and patient verbalized understanding. Final Clinical Impressions(s) / UC Diagnoses   Final diagnoses:  Screening examination for STD (sexually transmitted disease)  Acute otitis externa of left ear, unspecified type     Discharge Instructions      Start Cortisporin antibiotic eardrops to the left ear as prescribed.  Keep water out of the ear until you are done with treatment.  The clinical contact you with results of the STD testing done today if positive.  Please follow-up with your PCP if your symptoms do not improve.  Please go to the ER for any worsening symptoms.  I hope you feel better soon!    ED Prescriptions     Medication Sig Dispense Auth. Provider   neomycin-polymyxin-hydrocortisone (CORTISPORIN) 3.5-10000-1 OTIC suspension Place 4 drops into the left ear 3 (three) times daily for 7 days. 10 mL Radford Pax, NP      PDMP not reviewed this encounter.   Radford Pax, NP 09/22/22 1002

## 2022-09-22 NOTE — ED Triage Notes (Signed)
 Pt presents to UC for std check. Denies symptoms. No known exposure.

## 2022-09-22 NOTE — Discharge Instructions (Signed)
Start Cortisporin antibiotic eardrops to the left ear as prescribed.  Keep water out of the ear until you are done with treatment.  The clinical contact you with results of the STD testing done today if positive.  Please follow-up with your PCP if your symptoms do not improve.  Please go to the ER for any worsening symptoms.  I hope you feel better soon!

## 2022-09-23 LAB — RPR: RPR Ser Ql: NONREACTIVE

## 2022-09-23 LAB — HIV ANTIBODY (ROUTINE TESTING W REFLEX): HIV Screen 4th Generation wRfx: NONREACTIVE

## 2022-09-24 ENCOUNTER — Telehealth: Payer: Self-pay

## 2022-09-24 LAB — CERVICOVAGINAL ANCILLARY ONLY
Chlamydia: NEGATIVE
Comment: NEGATIVE
Comment: NEGATIVE
Comment: NORMAL
Neisseria Gonorrhea: NEGATIVE
Trichomonas: NEGATIVE

## 2022-09-24 NOTE — Telephone Encounter (Signed)
Patient left message stating she didn't see all of her test results. I tried calling back but call went straight to voicemail. Mychart message sent to patient informing her of all negative test results from last visit.

## 2022-10-24 ENCOUNTER — Other Ambulatory Visit: Payer: Self-pay

## 2022-10-24 ENCOUNTER — Emergency Department (HOSPITAL_COMMUNITY)
Admission: EM | Admit: 2022-10-24 | Discharge: 2022-10-25 | Disposition: A | Discharge status: Other Acute Inpatient Hospital | Payer: MEDICAID | Attending: Emergency Medicine | Admitting: Emergency Medicine

## 2022-10-24 ENCOUNTER — Encounter (HOSPITAL_COMMUNITY): Payer: Self-pay

## 2022-10-24 DIAGNOSIS — Z79899 Other long term (current) drug therapy: Secondary | ICD-10-CM | POA: Insufficient documentation

## 2022-10-24 DIAGNOSIS — F250 Schizoaffective disorder, bipolar type: Secondary | ICD-10-CM | POA: Diagnosis present

## 2022-10-24 DIAGNOSIS — F29 Unspecified psychosis not due to a substance or known physiological condition: Secondary | ICD-10-CM | POA: Insufficient documentation

## 2022-10-24 DIAGNOSIS — R4589 Other symptoms and signs involving emotional state: Secondary | ICD-10-CM | POA: Diagnosis present

## 2022-10-24 DIAGNOSIS — F319 Bipolar disorder, unspecified: Secondary | ICD-10-CM | POA: Diagnosis present

## 2022-10-24 DIAGNOSIS — J45909 Unspecified asthma, uncomplicated: Secondary | ICD-10-CM | POA: Diagnosis not present

## 2022-10-24 DIAGNOSIS — F23 Brief psychotic disorder: Secondary | ICD-10-CM

## 2022-10-24 LAB — CBC WITH DIFFERENTIAL/PLATELET
Abs Immature Granulocytes: 0.01 10*3/uL (ref 0.00–0.07)
Basophils Absolute: 0 10*3/uL (ref 0.0–0.1)
Basophils Relative: 0 %
Eosinophils Absolute: 0.1 10*3/uL (ref 0.0–0.5)
Eosinophils Relative: 1 %
HCT: 38.4 % (ref 36.0–46.0)
Hemoglobin: 12.6 g/dL (ref 12.0–15.0)
Immature Granulocytes: 0 %
Lymphocytes Relative: 35 %
Lymphs Abs: 2.1 10*3/uL (ref 0.7–4.0)
MCH: 29.9 pg (ref 26.0–34.0)
MCHC: 32.8 g/dL (ref 30.0–36.0)
MCV: 91.2 fL (ref 80.0–100.0)
Monocytes Absolute: 0.4 10*3/uL (ref 0.1–1.0)
Monocytes Relative: 7 %
Neutro Abs: 3.3 10*3/uL (ref 1.7–7.7)
Neutrophils Relative %: 57 %
Platelets: 213 10*3/uL (ref 150–400)
RBC: 4.21 MIL/uL (ref 3.87–5.11)
RDW: 12.7 % (ref 11.5–15.5)
WBC: 5.9 10*3/uL (ref 4.0–10.5)
nRBC: 0 % (ref 0.0–0.2)

## 2022-10-24 LAB — RAPID URINE DRUG SCREEN, HOSP PERFORMED
Amphetamines: NOT DETECTED
Barbiturates: NOT DETECTED
Benzodiazepines: NOT DETECTED
Cocaine: NOT DETECTED
Opiates: NOT DETECTED
Tetrahydrocannabinol: NOT DETECTED

## 2022-10-24 LAB — COMPREHENSIVE METABOLIC PANEL
ALT: 13 U/L (ref 0–44)
AST: 17 U/L (ref 15–41)
Albumin: 4 g/dL (ref 3.5–5.0)
Alkaline Phosphatase: 15 U/L — ABNORMAL LOW (ref 38–126)
Anion gap: 8 (ref 5–15)
BUN: 6 mg/dL (ref 6–20)
CO2: 26 mmol/L (ref 22–32)
Calcium: 9 mg/dL (ref 8.9–10.3)
Chloride: 104 mmol/L (ref 98–111)
Creatinine, Ser: 0.62 mg/dL (ref 0.44–1.00)
GFR, Estimated: >60 mL/min (ref >60)
Glucose, Bld: 106 mg/dL — ABNORMAL HIGH (ref 70–99)
Potassium: 3.3 mmol/L — ABNORMAL LOW (ref 3.5–5.1)
Sodium: 138 mmol/L (ref 135–145)
Total Bilirubin: 0.8 mg/dL (ref 0.3–1.2)
Total Protein: 6.9 g/dL (ref 6.5–8.1)

## 2022-10-24 LAB — SALICYLATE LEVEL: Salicylate Lvl: <7 mg/dL — ABNORMAL LOW (ref 7.0–30.0)

## 2022-10-24 LAB — PREGNANCY, URINE: Preg Test, Ur: NEGATIVE

## 2022-10-24 LAB — ETHANOL: Alcohol, Ethyl (B): <10 mg/dL (ref <10)

## 2022-10-24 LAB — ACETAMINOPHEN LEVEL: Acetaminophen (Tylenol), Serum: <10 ug/mL — ABNORMAL LOW (ref 10–30)

## 2022-10-24 MED ORDER — ZIPRASIDONE MESYLATE 20 MG IM SOLR
20.0000 mg | Freq: Once | INTRAMUSCULAR | Status: DC
  Filled 2022-10-24: qty 20

## 2022-10-24 MED ORDER — STERILE WATER FOR INJECTION IJ SOLN
INTRAMUSCULAR | Status: AC
  Filled 2022-10-24: qty 10

## 2022-10-24 NOTE — ED Triage Notes (Signed)
Patient brought in by police after being escorted from jail on an IVC. Per IVC papers, "patient has been responding to internal stimuli, observed speaking with unseen individuals and is distracted during interactions. Pt has been verbally combative, hostile, lack of cooperation and inability to regulate emotions. Pt currently presents with gross psychotic symptoms and has not been appropriately taking her medication. Has a history of schizoaffective disorder. " Patient currently refusing vital signs or to answer any questions asked regarding her health status. Also telling staff that she will press charges on them for talking to her.

## 2022-10-24 NOTE — ED Provider Notes (Signed)
[place] EMERGENCY DEPARTMENT AT [facility] Provider Note   CSN: [id number] Arrival date & time: 10/24/22  1512     History  Chief Complaint  Patient presents with   Psychiatric Evaluation    Jaclyn Owens is a 27 y.o. female with a past medical history significant for adjustment disorder, asthma, and bipolar 1 disorder who presents to the ED under IVC by EMS.  Per IVC paperwork patient has been " exhibiting behaviors consistent with responding to internal stimuli, observed speaking to unseen individuals and appears distracted during interactions."  Per IVC paperwork patient presents with "gross psychotic symptoms and absence of medication adherence, history of schizoaffective disorder.   Patient does not answer any questions during initial evaluation.  She notes she is going to sue everyone in the hospital.    History obtained from patient and past medical records. No interpreter used during encounter.       Home Medications Prior to Admission medications   Medication Sig Start Date End Date Taking? Authorizing Provider  hydrOXYzine (ATARAX) 25 MG tablet Take 1 tablet (25 mg total) by mouth 3 (three) times daily as needed for anxiety. 06/27/22   Massengill, Harrold Donath, MD  metroNIDAZOLE (FLAGYL) 500 MG tablet Take 1 tablet (500 mg total) by mouth 2 (two) times daily. 07/17/22   Lamptey, Britta Mccreedy, MD  OLANZapine (ZYPREXA) 10 MG tablet Take 1 tablet (10 mg total) by mouth at bedtime. 06/28/22 07/28/22  Massengill, Harrold Donath, MD  OLANZapine (ZYPREXA) 5 MG tablet Take 1 tablet (5 mg total) by mouth daily. 06/28/22 07/28/22  Massengill, Harrold Donath, MD  QUEtiapine (SEROQUEL XR) 50 MG TB24 24 hr tablet Take 2 tablets (100 mg total) by mouth at bedtime. 06/27/22 07/27/22  Massengill, Harrold Donath, MD  traZODone (DESYREL) 100 MG tablet Take 1 tablet (100 mg total) by mouth at bedtime as needed for sleep. 06/27/22   Massengill, Harrold Donath, MD      Allergies    Bee venom    Review of Systems   Review  of Systems  Unable to perform ROS: Psychiatric disorder    Physical Exam Updated Vital Signs Ht 5\' 6"  (1.676 m)   Wt 59 kg   BMI 20.98 kg/m  Physical Exam Vitals and nursing note reviewed.  Constitutional:      General: She is not in acute distress.    Appearance: She is not ill-appearing.  HENT:     Head: Normocephalic.  Cardiovascular:     Rate and Rhythm: Normal rate and regular rhythm.  Pulmonary:     Effort: Pulmonary effort is normal.     Breath sounds: Normal breath sounds.  Musculoskeletal:        General: Normal range of motion.     Cervical back: Neck supple.  Skin:    Coloration: Skin is not jaundiced.  Neurological:     Mental Status: She is alert.  Psychiatric:        Attention and Perception: She is inattentive.        Mood and Affect: Affect is angry.        Speech: She is noncommunicative.     ED Results / Procedures / Treatments   Labs (all labs ordered are listed, but only abnormal results are displayed) Labs Reviewed  COMPREHENSIVE METABOLIC PANEL - Abnormal; Notable for the following components:      Result Value   Potassium 3.3 (*)    Glucose, Bld 106 (*)    Alkaline Phosphatase 15 (*)    All other  components within normal limits  ACETAMINOPHEN LEVEL - Abnormal; Notable for the following components:   Acetaminophen (Tylenol), Serum <10 (*)    All other components within normal limits  SALICYLATE LEVEL - Abnormal; Notable for the following components:   Salicylate Lvl <7.0 (*)    All other components within normal limits  ETHANOL  CBC WITH DIFFERENTIAL/PLATELET  RAPID URINE DRUG SCREEN, HOSP PERFORMED  HCG, SERUM, QUALITATIVE  PREGNANCY, URINE    EKG None  Radiology No results found.  Procedures Procedures    Medications Ordered in ED Medications  ziprasidone (GEODON) injection 20 mg (has no administration in time range)  sterile water (preservative free) injection (has no administration in time range)    ED Course/  Medical Decision Making/ A&P                                 Medical Decision Making Amount and/or Complexity of Data Reviewed Independent Historian:     Details: [employer] Labs: ordered. Decision-making details documented in ED Course.  Risk Prescription drug management.   This patient presents to the ED for concern of hallucinations, this involves an extensive number of treatment options, and is a complaint that carries with it a high risk of complications and morbidity.  The differential diagnosis includes psychosis, metabolic derangement, infection, etc  27 year old female presents to the ED under IVC by [employer].  Per IVC paperwork patient responding to internal stimuli.  History of schizo affective disorder.  Has not been taking her medications.  Patient will not communicate to me during initial evaluation.  She told me numerous times to leave her alone.  Will not answer any questions or allow RN to obtain vitals or blood work. Geodon ordered; however patient willing to have labs without medication. First examination performed.   CBC unremarkable.  No leukocytosis.  Normal hemoglobin.  CMP significant for hypokalemia at 3.3.  Normal renal function.  Ethanol, salicylate, acetaminophen levels within normal limits.  Patient has been medically cleared for TTS evaluation.  Patient is currently under IVC.  The patient has been placed in psychiatric observation due to the need to provide a safe environment for the patient while obtaining psychiatric consultation and evaluation, as well as ongoing medical and medication management to treat the patient's condition.  The patient has been placed under full IVC at this time.  Lives at home, just left jail Hx bipolar disorder No PCP       Final Clinical Impression(s) / ED Diagnoses Final diagnoses:  Acute psychosis [facility])    Rx / DC Orders ED Discharge Orders     None         Jesusita Oka 10/24/22 2122    Lorre Nick,  MD 10/27/22 [phone]

## 2022-10-25 ENCOUNTER — Other Ambulatory Visit: Payer: Self-pay

## 2022-10-25 ENCOUNTER — Inpatient Hospital Stay (HOSPITAL_COMMUNITY)
Admission: AD | Admit: 2022-10-25 | Discharge: 2022-10-31 | DRG: 885 | Disposition: A | Discharge status: Home / Self Care | Payer: MEDICAID | Source: Intra-hospital | Attending: Psychiatry | Admitting: Psychiatry

## 2022-10-25 DIAGNOSIS — F250 Schizoaffective disorder, bipolar type: Secondary | ICD-10-CM | POA: Diagnosis present

## 2022-10-25 DIAGNOSIS — Z79899 Other long term (current) drug therapy: Secondary | ICD-10-CM | POA: Diagnosis not present

## 2022-10-25 DIAGNOSIS — Z560 Unemployment, unspecified: Secondary | ICD-10-CM

## 2022-10-25 DIAGNOSIS — F29 Unspecified psychosis not due to a substance or known physiological condition: Secondary | ICD-10-CM | POA: Diagnosis not present

## 2022-10-25 DIAGNOSIS — Z9151 Personal history of suicidal behavior: Secondary | ICD-10-CM

## 2022-10-25 DIAGNOSIS — F312 Bipolar disorder, current episode manic severe with psychotic features: Principal | ICD-10-CM | POA: Diagnosis present

## 2022-10-25 DIAGNOSIS — F319 Bipolar disorder, unspecified: Secondary | ICD-10-CM | POA: Diagnosis present

## 2022-10-25 DIAGNOSIS — F17290 Nicotine dependence, other tobacco product, uncomplicated: Secondary | ICD-10-CM | POA: Diagnosis present

## 2022-10-25 MED ORDER — DIAZEPAM 5 MG/ML IJ SOLN: 10.0000 mg | Freq: Four times a day (QID) | INTRAMUSCULAR | Status: DC | PRN

## 2022-10-25 MED ORDER — HYDROXYZINE HCL 25 MG PO TABS
25.0000 mg | ORAL_TABLET | Freq: Three times a day (TID) | ORAL | Status: DC | PRN
  Administered 2022-10-30 – 2022-10-31 (×2): 25 mg via ORAL
  Filled 2022-10-25 (×2): qty 1

## 2022-10-25 MED ORDER — QUETIAPINE FUMARATE ER 50 MG PO TB24
100.0000 mg | ORAL_TABLET | Freq: Every day | ORAL | Status: DC
  Administered 2022-10-25: 100 mg via ORAL
  Filled 2022-10-25 (×3): qty 2

## 2022-10-25 MED ORDER — LORAZEPAM 1 MG PO TABS: 2.0000 mg | ORAL_TABLET | Freq: Three times a day (TID) | ORAL | Status: DC | PRN

## 2022-10-25 MED ORDER — OLANZAPINE 5 MG PO TBDP
5.0000 mg | ORAL_TABLET | Freq: Two times a day (BID) | ORAL | Status: DC
  Administered 2022-10-25: 5 mg via ORAL
  Filled 2022-10-25: qty 1

## 2022-10-25 MED ORDER — DIPHENHYDRAMINE HCL 25 MG PO CAPS: 50.0000 mg | ORAL_CAPSULE | Freq: Three times a day (TID) | ORAL | Status: DC | PRN

## 2022-10-25 MED ORDER — DIPHENHYDRAMINE HCL 50 MG/ML IJ SOLN: 50.0000 mg | Freq: Three times a day (TID) | INTRAMUSCULAR | Status: DC | PRN

## 2022-10-25 MED ORDER — ALUM & MAG HYDROXIDE-SIMETH 200-200-20 MG/5ML PO SUSP: 30.0000 mL | ORAL | Status: DC | PRN

## 2022-10-25 MED ORDER — HALOPERIDOL 5 MG PO TABS: 5.0000 mg | ORAL_TABLET | Freq: Three times a day (TID) | ORAL | Status: DC | PRN

## 2022-10-25 MED ORDER — ACETAMINOPHEN 325 MG PO TABS: 650.0000 mg | ORAL_TABLET | Freq: Four times a day (QID) | ORAL | Status: DC | PRN

## 2022-10-25 MED ORDER — MAGNESIUM HYDROXIDE 400 MG/5ML PO SUSP
30.0000 mL | Freq: Every day | ORAL | Status: DC | PRN
  Administered 2022-10-26 – 2022-10-28 (×2): 30 mL via ORAL
  Filled 2022-10-25 (×2): qty 30

## 2022-10-25 MED ORDER — LORAZEPAM 2 MG/ML IJ SOLN: 2.0000 mg | Freq: Three times a day (TID) | INTRAMUSCULAR | Status: DC | PRN

## 2022-10-25 MED ORDER — ZIPRASIDONE MESYLATE 20 MG IM SOLR: 10.0000 mg | Freq: Four times a day (QID) | INTRAMUSCULAR | Status: DC | PRN

## 2022-10-25 MED ORDER — ENSURE ENLIVE PO LIQD
237.0000 mL | Freq: Two times a day (BID) | ORAL | Status: DC
  Administered 2022-10-26 – 2022-10-31 (×10): 237 mL via ORAL
  Filled 2022-10-25 (×15): qty 237

## 2022-10-25 MED ORDER — DIPHENHYDRAMINE HCL 50 MG/ML IJ SOLN: 50.0000 mg | Freq: Four times a day (QID) | INTRAMUSCULAR | Status: DC | PRN

## 2022-10-25 MED ORDER — HALOPERIDOL LACTATE 5 MG/ML IJ SOLN: 5.0000 mg | Freq: Three times a day (TID) | INTRAMUSCULAR | Status: DC | PRN

## 2022-10-25 MED ORDER — OLANZAPINE 5 MG PO TBDP
5.0000 mg | ORAL_TABLET | Freq: Two times a day (BID) | ORAL | Status: DC
  Administered 2022-10-25 – 2022-10-26 (×2): 5 mg via ORAL
  Filled 2022-10-25 (×5): qty 1

## 2022-10-25 MED ORDER — TRAZODONE HCL 100 MG PO TABS
100.0000 mg | ORAL_TABLET | Freq: Every evening | ORAL | Status: DC | PRN
  Administered 2022-10-25 – 2022-10-30 (×5): 100 mg via ORAL
  Filled 2022-10-25 (×5): qty 1

## 2022-10-25 NOTE — ED Notes (Signed)
Patient discharged off unit to [facility] per provider. Patient alert, no s/s of distress. Discharge information and belongings given to [employer] of transport. Patient ambulatory off unit, escorted and transported by [employer].

## 2022-10-25 NOTE — ED Notes (Signed)
TTS consult will be completed by IRIS provider. IRIS coordinator will reach out in this chat with the time of assessment and the provider that will complete assessment.

## 2022-10-25 NOTE — Progress Notes (Signed)
[facility] Consult Note  Patient Name: Jaclyn Owens MRN: [id number] DOB: 21-Nov-1995 DATE OF Consult: 10/25/2022  PRIMARY PSYCHIATRIC DIAGNOSES  1.  Schizoaffective Disorder, Bipolar Type   RECOMMENDATIONS  Recommendations: Medication recommendations: Have begun patient on medication previously tolerated:  Zyprexa Zydis, 5 mg SL bid for psychosis/mood control.  For agitation, have ordered Geodon, 10 mg IM q6h PRN AND Valium 10 mg IM q6h PRN AND Benadryl 50 mg IM q6h PRN Non-Medication/therapeutic recommendations: Patient's thinking is quite disorganized, with mood dysregulation, and will require suicidal observation per hospital/ED protocol.   Continue matter-of-fact emotional support in ED, pending transfer Is inpatient psychiatric hospitalization recommended for this patient? Yes (Explain why): Patient thinking is quite disorganized, and appears to be responding to internal stimuli.  Was hostile and aggressive in jail, as well as in ED, requiring medication to decrease agitation.  Patient meets involuntary commitment criteria, with psychotic thinking, mood dysregulation, and dangerousness to others. Follow-Up Telepsychiatry C/L services: We will sign off for now. Please re-consult our service if needed for any concerning changes in the patient's condition, discharge planning, or questions. Communication: Treatment team members (and family members if applicable) who were involved in treatment/care discussions and planning, and with whom we spoke or engaged with via secure text/chat, include the following: Have sent secure message to Ms. Jerel Shepherd, and ED staff outlining above.   Thank you for involving [place] in the care of this patient. If you have any additional questions or concerns, please call [phone] and ask for the provider on-call.  TELEPSYCHIATRY ATTESTATION & CONSENT   As the provider for this telehealth consult, I attest that I verified the patient's identity using two  separate identifiers, introduced myself to the patient, provided my credentials, disclosed my location, and performed this encounter via a HIPAA-compliant, real-time, face-to-face, two-way, interactive audio and video platform and with the full consent and agreement of the patient (or guardian as applicable.)   Patient physical location: [facility]. Telehealth provider physical location: home office in state of [place].  Video start time: 0320h (Eastern Time) Video end time: 3 (Eastern Time)  Total time spent in this encounter was 30 minutes, including record review, clinical interview, behavior observations, discussion of impressions and recommendations (including medications and hospitalization), and consultation/communication with relevant parties  IDENTIFYING DATA  Jac[facility] Owens is a 27 y.o. year-old female for whom a psychiatric consultation has been ordered by the primary provider. The patient was identified using two separate identifiers.  CHIEF COMPLAINT/REASON FOR CONSULT   I'm fine.  My mother sent me here.  There's nothing wrong with me.   HISTORY OF PRESENT ILLNESS (HPI)   The patient presents with long history of schizoaffective disorder, with history of past psychiatric hospitalizations per Epic.  Patient herself is denying any problems:  "there's nothing wrong with me, and they're lying at the jail.  I'm fine, and I don't need any medication.  My mother sent me here, and you're working with her."   Patient refused to discuss her situation any further, and she perseverated that she was "fine" and that I was working with her mother to get her into the hospital.  Per Epic, patient came to ED from local jail on IVC.  Patient was becoming increasingly hostile and agitated at the jail, threatening others.  Was responding to internal stimuli and would not cooperate with any requests.  Patient has not been taking any psychiatric medications recently, but per Epic, had been in  hospital at least in  Comment:        THE SENSITIVITY OF THIS METHODOLOGY IS >25 mIU/mL. Performed at [facility], [address] [place], [place] [address]     PSYCHIATRIC REVIEW OF SYSTEMS (ROS)  ROS: Notable for the following relevant positive findings: Review of  Systems  Constitutional: Negative.   HENT: Negative.    Eyes: Negative.   Respiratory: Negative.    Cardiovascular: Negative.   Gastrointestinal: Negative.   Genitourinary: Negative.   Musculoskeletal: Negative.   Skin: Negative.   Neurological: Negative.   Endo/Heme/Allergies: Negative.   Psychiatric/Behavioral:  Positive for hallucinations. The patient is nervous/anxious.     Additional findings:      Musculoskeletal: No abnormal movements observed      Gait & Station: Laying/Sitting      Pain Screening: Denies        RISK FORMULATION/ASSESSMENT  Is the patient experiencing any suicidal or homicidal ideations: Yes        Explain if yes: Per information from jail, patient was becoming hostile and threatening to others in the jail, responding to internal stimuli  Protective factors considered for safety management:   Patient not responding to interventions at jail or in ED, and is refusing any type of treatment  Risk factors/concerns considered for safety management:  Impulsivity Aggression Unwillingness to seek help Unmarried  Is there a safety management plan with the patient and treatment team to minimize risk factors and promote protective factors: No           Explain: Unable to be safely managed in jail, even with 24-hour supervision  Is crisis care placement or psychiatric hospitalization recommended: Yes     Based on my current evaluation and risk assessment, patient is determined at this time to be at:  High risk  *RISK ASSESSMENT Risk assessment is a dynamic process; it is possible that this patient's condition, and risk level, may change. This should be re-evaluated and managed over time as appropriate. Please re-consult psychiatric consult services if additional assistance is needed in terms of risk assessment and management. If your team decides to discharge this patient, please advise the patient how to best access emergency psychiatric services, or to call  911, if their condition worsens or they feel unsafe in any way.   Ezekiel Slocumb, MD Telepsychiatry Consult ServicesPatient ID: Sindy Guadeloupe, female   DOB: 23-Nov-1995, 27 y.o.   MRN: [id number]  Comment:        THE SENSITIVITY OF THIS METHODOLOGY IS >25 mIU/mL. Performed at [facility], [address] [place], [place] [address]     PSYCHIATRIC REVIEW OF SYSTEMS (ROS)  ROS: Notable for the following relevant positive findings: Review of  Systems  Constitutional: Negative.   HENT: Negative.    Eyes: Negative.   Respiratory: Negative.    Cardiovascular: Negative.   Gastrointestinal: Negative.   Genitourinary: Negative.   Musculoskeletal: Negative.   Skin: Negative.   Neurological: Negative.   Endo/Heme/Allergies: Negative.   Psychiatric/Behavioral:  Positive for hallucinations. The patient is nervous/anxious.     Additional findings:      Musculoskeletal: No abnormal movements observed      Gait & Station: Laying/Sitting      Pain Screening: Denies        RISK FORMULATION/ASSESSMENT  Is the patient experiencing any suicidal or homicidal ideations: Yes        Explain if yes: Per information from jail, patient was becoming hostile and threatening to others in the jail, responding to internal stimuli  Protective factors considered for safety management:   Patient not responding to interventions at jail or in ED, and is refusing any type of treatment  Risk factors/concerns considered for safety management:  Impulsivity Aggression Unwillingness to seek help Unmarried  Is there a safety management plan with the patient and treatment team to minimize risk factors and promote protective factors: No           Explain: Unable to be safely managed in jail, even with 24-hour supervision  Is crisis care placement or psychiatric hospitalization recommended: Yes     Based on my current evaluation and risk assessment, patient is determined at this time to be at:  High risk  *RISK ASSESSMENT Risk assessment is a dynamic process; it is possible that this patient's condition, and risk level, may change. This should be re-evaluated and managed over time as appropriate. Please re-consult psychiatric consult services if additional assistance is needed in terms of risk assessment and management. If your team decides to discharge this patient, please advise the patient how to best access emergency psychiatric services, or to call  911, if their condition worsens or they feel unsafe in any way.   Ezekiel Slocumb, MD Telepsychiatry Consult ServicesPatient ID: Sindy Guadeloupe, female   DOB: 23-Nov-1995, 27 y.o.   MRN: [id number]  Comment:        THE SENSITIVITY OF THIS METHODOLOGY IS >25 mIU/mL. Performed at [facility], [address] [place], [place] [address]     PSYCHIATRIC REVIEW OF SYSTEMS (ROS)  ROS: Notable for the following relevant positive findings: Review of  Systems  Constitutional: Negative.   HENT: Negative.    Eyes: Negative.   Respiratory: Negative.    Cardiovascular: Negative.   Gastrointestinal: Negative.   Genitourinary: Negative.   Musculoskeletal: Negative.   Skin: Negative.   Neurological: Negative.   Endo/Heme/Allergies: Negative.   Psychiatric/Behavioral:  Positive for hallucinations. The patient is nervous/anxious.     Additional findings:      Musculoskeletal: No abnormal movements observed      Gait & Station: Laying/Sitting      Pain Screening: Denies        RISK FORMULATION/ASSESSMENT  Is the patient experiencing any suicidal or homicidal ideations: Yes        Explain if yes: Per information from jail, patient was becoming hostile and threatening to others in the jail, responding to internal stimuli  Protective factors considered for safety management:   Patient not responding to interventions at jail or in ED, and is refusing any type of treatment  Risk factors/concerns considered for safety management:  Impulsivity Aggression Unwillingness to seek help Unmarried  Is there a safety management plan with the patient and treatment team to minimize risk factors and promote protective factors: No           Explain: Unable to be safely managed in jail, even with 24-hour supervision  Is crisis care placement or psychiatric hospitalization recommended: Yes     Based on my current evaluation and risk assessment, patient is determined at this time to be at:  High risk  *RISK ASSESSMENT Risk assessment is a dynamic process; it is possible that this patient's condition, and risk level, may change. This should be re-evaluated and managed over time as appropriate. Please re-consult psychiatric consult services if additional assistance is needed in terms of risk assessment and management. If your team decides to discharge this patient, please advise the patient how to best access emergency psychiatric services, or to call  911, if their condition worsens or they feel unsafe in any way.   Ezekiel Slocumb, MD Telepsychiatry Consult ServicesPatient ID: Sindy Guadeloupe, female   DOB: 23-Nov-1995, 27 y.o.   MRN: [id number]  Comment:        THE SENSITIVITY OF THIS METHODOLOGY IS >25 mIU/mL. Performed at [facility], [address] [place], [place] [address]     PSYCHIATRIC REVIEW OF SYSTEMS (ROS)  ROS: Notable for the following relevant positive findings: Review of  Systems  Constitutional: Negative.   HENT: Negative.    Eyes: Negative.   Respiratory: Negative.    Cardiovascular: Negative.   Gastrointestinal: Negative.   Genitourinary: Negative.   Musculoskeletal: Negative.   Skin: Negative.   Neurological: Negative.   Endo/Heme/Allergies: Negative.   Psychiatric/Behavioral:  Positive for hallucinations. The patient is nervous/anxious.     Additional findings:      Musculoskeletal: No abnormal movements observed      Gait & Station: Laying/Sitting      Pain Screening: Denies        RISK FORMULATION/ASSESSMENT  Is the patient experiencing any suicidal or homicidal ideations: Yes        Explain if yes: Per information from jail, patient was becoming hostile and threatening to others in the jail, responding to internal stimuli  Protective factors considered for safety management:   Patient not responding to interventions at jail or in ED, and is refusing any type of treatment  Risk factors/concerns considered for safety management:  Impulsivity Aggression Unwillingness to seek help Unmarried  Is there a safety management plan with the patient and treatment team to minimize risk factors and promote protective factors: No           Explain: Unable to be safely managed in jail, even with 24-hour supervision  Is crisis care placement or psychiatric hospitalization recommended: Yes     Based on my current evaluation and risk assessment, patient is determined at this time to be at:  High risk  *RISK ASSESSMENT Risk assessment is a dynamic process; it is possible that this patient's condition, and risk level, may change. This should be re-evaluated and managed over time as appropriate. Please re-consult psychiatric consult services if additional assistance is needed in terms of risk assessment and management. If your team decides to discharge this patient, please advise the patient how to best access emergency psychiatric services, or to call  911, if their condition worsens or they feel unsafe in any way.   Ezekiel Slocumb, MD Telepsychiatry Consult ServicesPatient ID: Sindy Guadeloupe, female   DOB: 23-Nov-1995, 27 y.o.   MRN: [id number]

## 2022-10-25 NOTE — ED Provider Notes (Signed)
Emergency Medicine Observation Re-evaluation Note  [facility] Owens is a 27 y.o. female, seen on rounds today.  Pt initially presented to the ED for complaints of Psychiatric Evaluation Patient is currently holding in the psychiatric ED  Physical Exam  BP 104/89 (BP Location: Left Arm)   Pulse 74   Temp 98.4 F (36.9 C) (Oral)   Resp 18   Ht 1.676 m (5\' 6" )   Wt 59 kg   SpO2 100%   BMI 20.98 kg/m  Physical Exam General: No acute distress Cardiac: Regular rate Lungs: Normal effort   ED Course / MDM  EKG:EKG Interpretation Date/Time:  Wednesday October 24 2022 19:35:20 EDT Ventricular Rate:  63 PR Interval:  104 QRS Duration:  86 QT Interval:  412 QTC Calculation: 421 R Axis:   60  Text Interpretation: Sinus rhythm with marked sinus arrhythmia with short PR Otherwise normal ECG When compared with ECG of 26-Jun-2022 16:22, No significant change was found Confirmed by Dione Booze ([id number]) on 10/24/2022 11:29:47 PM  I have reviewed the labs performed to date as well as medications administered while in observation.  Recent changes in the last 24 hours include patient psychiatric evaluation.Medications ordered for agitation.   Plan  Current plan is for inpt tx . Med reccs provided by psychiatry.    Linwood Dibbles, MD 10/25/22 [phone]

## 2022-10-25 NOTE — BH Assessment (Signed)
TTS consult will be completed by IRIS provider. IRIS coordinator will reach out in secure chat with the time of assessment and the provider that will complete assessment.

## 2022-10-25 NOTE — ED Notes (Signed)
TTS assessment completed orders for zyprexa 5 mg to be started now obtained along with PRN meds for agitation.

## 2022-10-25 NOTE — Progress Notes (Signed)
Pt was accepted to [facility] TODAY 10/25/2022; Bed Assignment 500-1  Pt meets inpatient criteria per Earney Navy, NP-PMHNP-BC    Attending Physician will be Dr. Phineas Inches, MD   Report can be called to: -Adult unit: [phone]  Pt can arrive after: BED IS READY NOW   Care Team notified: Evening [facility] Kelly Southard,RN Earney Navy, NP-PMHNP-BC, Maryanna Shape [place], [place] 10/25/2022 @ 5:28 PM

## 2022-10-25 NOTE — Progress Notes (Signed)
   10/25/22 2130  Psych Admission Type (Psych Patients Only)  Admission Status Involuntary  Psychosocial Assessment  Patient Complaints Anxiety;Suspiciousness  Eye Contact Fair  Facial Expression Flat  Affect Appropriate to circumstance  Speech Logical/coherent  Interaction Assertive  Motor Activity Restless  Appearance/Hygiene In scrubs  Behavior Characteristics Cooperative  Mood Suspicious;Anxious  Aggressive Behavior  Effect No apparent injury  Thought Process  Coherency Circumstantial;Tangential  Content Blaming others;Preoccupation  Delusions WDL  Perception Hallucinations  Hallucination Auditory  Judgment Impaired  Confusion None  Danger to Self  Current suicidal ideation? Denies  Danger to Others  Danger to Others None reported or observed

## 2022-10-25 NOTE — Progress Notes (Signed)
Pt admitted to [facility] from [facility] under IVC status. Initially presented to [facility] with EMS from jail as she was observed actively responding to internal stimuli; talking to unseen others, distracted on interactions in the context of medication noncompliance. Pt is diagnosed with Schizoaffective disorder. Presents with blunted affect, irritable, fidgety, restless with intense stares; sometimes blank at the table as if responding to internal stimuli on initial contact. Per pt "All I remember was I went to pick my kids up and my grandma called the police on me. They took me jail". Pt was recently d/c from [facility]; reports medication noncompliance "I took it here and there. Please just keep me on the Zyprexa. It's better for me, it keeps me calm. I do have a job, I can go back to [employer] and restart my job". Cooperative with skin assessment, no areas of breakdown noted. Tattoos scattered all over pt's body. Belongings searched, items deemed contraband secured in assigned locker. Pt ambulatory to milieu with a steady gait. Unit orientation done, routines discussed, care plan reviewed with pt and admission documents signed. Pt verbalized understanding. Safety checks initiated at Q 15 minutes intervals without incident. Emotional support, encouragement and reassurance offered to pt this shift. Pt tolerated meals and fluids well. Denies concerns at this time.

## 2022-10-25 NOTE — Plan of Care (Signed)
  Problem: Activity: Goal: Sleeping patterns will improve Outcome: Progressing   Problem: Safety: Goal: Periods of time without injury will increase Outcome: Progressing   Problem: Education: Goal: Emotional status will improve Outcome: Not Progressing Goal: Mental status will improve Outcome: Not Progressing   Problem: Activity: Goal: Interest or engagement in activities will improve Outcome: Not Progressing

## 2022-10-25 NOTE — ED Notes (Signed)
Pt received breakfast tray

## 2022-10-25 NOTE — Progress Notes (Signed)
Psychoeducational Group Note  Date:  10/25/2022 Time:  2043  Group Topic/Focus:  Wrap-Up Group:   The focus of this group is to help patients review their daily goal of treatment and discuss progress on daily workbooks.  Participation Level: Did Not Attend  Participation Quality:  Not Applicable  Affect:  Not Applicable  Cognitive:  Not Applicable  Insight:  Not Applicable  Engagement in Group: Not Applicable  Additional Comments:  The patient did not attend the group this evening.   Westly Pam 10/25/2022, 8:39 PM

## 2022-10-26 ENCOUNTER — Encounter (HOSPITAL_COMMUNITY): Payer: Self-pay

## 2022-10-26 DIAGNOSIS — F312 Bipolar disorder, current episode manic severe with psychotic features: Secondary | ICD-10-CM | POA: Diagnosis not present

## 2022-10-26 MED ORDER — OLANZAPINE 10 MG PO TBDP
10.0000 mg | ORAL_TABLET | Freq: Every day | ORAL | Status: DC
  Administered 2022-10-26: 10 mg via ORAL
  Filled 2022-10-26 (×2): qty 1

## 2022-10-26 MED ORDER — ADULT MULTIVITAMIN W/MINERALS CH
1.0000 | ORAL_TABLET | Freq: Every day | ORAL | Status: DC
  Administered 2022-10-30 – 2022-10-31 (×2): 1 via ORAL
  Filled 2022-10-26 (×8): qty 1

## 2022-10-26 NOTE — H&P (Signed)
Psychiatric Admission Assessment Adult  Patient Identification: Jaclyn Owens MRN:  [id number] Date of Evaluation:  10/26/2022 Chief Complaint:  Schizoaffective disorder, bipolar type (HCC) [F25.0] Principal Diagnosis: Severe manic bipolar 1 disorder with psychotic behavior (HCC) Diagnosis:  Principal Problem:   Severe manic bipolar 1 disorder with psychotic behavior (HCC)  History of Present Illness:   Patient is a 27 year old female with a past psychiatric history of bipolar disorder who was admitted to the psychiatric hospital from [facility] after being taken there from jail for psychosis, mood lability, hostility and verbal combativeness.  Patient was apparently IVC at the jail.  Prior to admission outpatient psychiatric medications: Patient was not taking her psychiatric medications since recent discharge from the psychiatric hospitalization.  On my examination today, the patient reports that she has been in jail for about 3 weeks after "acting crazy at my grandmother's house".  Patient reports that she was out of medication after discharge from recent psychiatric hospitalization.  She reports that Zyprexa was effective for her during the previous hospitalization.  Initially the patient denies having any significant psychiatric symptoms in jail or prior to going to jail, then later states that "I was acting crazy in jail" but the patient denies experiencing any other psychosis such as hallucinations or paranoia.  Her thoughts are somewhat disorganized and tangential.  She is somewhat paranoid.  There is significant documentation patient was responding to internal stimuli prior to admission although the patient denies this at this time.  Patient denies any substance use recently.  Patient does report that her mood is irritable.  She reports significant mood swings from being very depressed to very elated, to very irritable.  She reports decreased need for sleep and increased energy.   She demonstrates some pressured speech and flight of ideas.  She reports that appetite is much less with being unsure if she has any weight changes recently.  Concentration is poor.  At this time she denies any SI or HI.  She reports that anxiety is at a very high and severe level, generalized, acute on chronic, with difficulty maintaining concentration, impairment of sleep, and muscle tension.  Denies having any panic attacks.  Patient reports a history of trauma, but will not disclose details of traumatic events or her psychological responses to trauma stating "that is all in the past".  Past psychiatric history: Patient reports previous psychiatric diagnosis of bipolar disorder.  Reports 3 psychiatric hospitalizations in the past.  Reports a history of overdose in 2018 as suicide attempt.  No current psychiatric medications.  Patient reports psychiatric medication history is significant for: Seroquel, Zyprexa, trazodone, hydroxyzine.  Past medical history: The patient denies any acute or chronic medical illness.  Patient reports last menstrual period was 3.5 weeks ago.  Patient denies being on any current contraception and "I plan to have anymore kids".  Denies any seizure history or head trauma.  Substance use history is significant for marijuana and smoking black amounts, but not in the last few weeks.  Denies any significant history of alcohol use or other illicit drug use.  Family history: Patient denies any significant family history of psychiatric illness or known family history of suicide attempts.  Social history: Patient reports she lives alone, has 3 children that are currently in foster care.  Is unemployed.   Total Time spent with patient: 30 minutes    Is the patient at risk to self? No.  Has the patient been a risk to self in the past 6  months? No.  Has the patient been a risk to self within the distant past? Yes.    Is the patient a risk to others? Yes.    Has the patient been  a risk to others in the past 6 months? Yes.    Has the patient been a risk to others within the distant past? Yes.     [place] Scale:  Flowsheet Row Ad[facility] ([facility]) [facility] from 10/24/2022 in [facility] Department at [facility] ED from 09/22/2022 in [facility] at [facility])  C-SSRS RISK CATEGORY No Risk No Risk No Risk          Alcohol Screening:   Substance Abuse History in the last 12 months:  Yes.   Consequences of Substance Abuse: Negative Legal Consequences:  arrested  Previous Psychotropic Medications: Yes  Psychological Evaluations: Yes  Past Medical History:  Past Medical History:  Diagnosis Date   Asthma    HSV (herpes simplex virus) infection     Past Surgical History:  Procedure Laterality Date   ARM WOUND REPAIR / CLOSURE     Family History:  Family History  Problem Relation Age of Onset   Asthma Mother    Diabetes Maternal Aunt    Cancer Maternal Grandmother     Tobacco Screening:  Social History   Tobacco Use  Smoking Status Former   Types: Cigarettes, Cigars  Smokeless Tobacco Never    BH Tobacco Counseling     Are you interested in Tobacco Cessation Medications?  No value filed. Counseled patient on smoking cessation:  No value filed. Reason Tobacco Screening Not Completed: No value filed.       Social History:  Social History   Substance and Sexual Activity  Alcohol Use Not Currently   Comment: UTA, pt is not cooperative with triage questions 06/16/22     Social History   Substance and Sexual Activity  Drug Use Yes   Types: Marijuana    Additional Social History: Marital status: Single Are you sexually active?: No What is your sexual orientation?: heterosexual Has your sexual activity been affected by drugs, alcohol, medication, or emotional stress?: no Does patient have children?: Yes How many children?: 3 How is  patient's relationship with their children?: "They are in CPS. I need to get them"                         Allergies:  No Active Allergies Lab Results:  Results for orders placed or performed during the hospital encounter of 10/24/22 (from the past 48 hour(s))  Comprehensive metabolic panel     Status: Abnormal   Collection Time: 10/24/22  4:39 PM  Result Value Ref Range   Sodium 138 135 - 145 mmol/L   Potassium 3.3 (L) 3.5 - 5.1 mmol/L   Chloride 104 98 - 111 mmol/L   CO2 26 22 - 32 mmol/L   Glucose, Bld 106 (H) 70 - 99 mg/dL    Comment: Glucose reference range applies only to samples taken after fasting for at least 8 hours.   BUN 6 6 - 20 mg/dL   Creatinine, Ser 8.65 0.44 - 1.00 mg/dL   Calcium 9.0 8.9 - 78.4 mg/dL   Total Protein 6.9 6.5 - 8.1 g/dL   Albumin 4.0 3.5 - 5.0 g/dL   AST 17 15 - 41 U/L   ALT 13 0 - 44 U/L  Alkaline Phosphatase 15 (L) 38 - 126 U/L   Total Bilirubin 0.8 0.3 - 1.2 mg/dL   GFR, Estimated >02 >72 mL/min    Comment: (NOTE) Calculated using the CKD-EPI Creatinine Equation (2021)    Anion gap 8 5 - 15    Comment: Performed at [facility], [address] [place], [place] [address]  CBC with Diff     Status: None   Collection Time: 10/24/22  4:39 PM  Result Value Ref Range   WBC 5.9 4.0 - 10.5 K/uL   RBC 4.21 3.87 - 5.11 MIL/uL   Hemoglobin 12.6 12.0 - 15.0 g/dL   HCT 40.3 47.4 - 25.9 %   MCV 91.2 80.0 - 100.0 fL   MCH 29.9 26.0 - 34.0 pg   MCHC 32.8 30.0 - 36.0 g/dL   RDW 56.3 87.5 - 64.3 %   Platelets 213 150 - 400 K/uL   nRBC 0.0 0.0 - 0.2 %   Neutrophils Relative % 57 %   Neutro Abs 3.3 1.7 - 7.7 K/uL   Lymphocytes Relative 35 %   Lymphs Abs 2.1 0.7 - 4.0 K/uL   Monocytes Relative 7 %   Monocytes Absolute 0.4 0.1 - 1.0 K/uL   Eosinophils Relative 1 %   Eosinophils Absolute 0.1 0.0 - 0.5 K/uL   Basophils Relative 0 %   Basophils Absolute 0.0 0.0 - 0.1 K/uL   Immature Granulocytes 0 %   Abs Immature  Granulocytes 0.01 0.00 - 0.07 K/uL    Comment: Performed at [facility], [address], [place], [place] [address]  Ethanol     Status: None   Collection Time: 10/24/22  4:41 PM  Result Value Ref Range   Alcohol, Ethyl (B) <10 <10 mg/dL    Comment: (NOTE) Lowest detectable limit for serum alcohol is 10 mg/dL.  For medical purposes only. Performed at [facility], [address], [place], [place] [address]   Acetaminophen level     Status: Abnormal   Collection Time: 10/24/22  4:41 PM  Result Value Ref Range   Acetaminophen (Tylenol), Serum <10 (L) 10 - 30 ug/mL    Comment: (NOTE) Therapeutic concentrations vary significantly. A range of 10-30 ug/mL  may be an effective concentration for many patients. However, some  are best treated at concentrations outside of this range. Acetaminophen concentrations >150 ug/mL at 4 hours after ingestion  and >50 ug/mL at 12 hours after ingestion are often associated with  toxic reactions.  Performed at [facility], [address] [place], [place] [address]   Salicylate level     Status: Abnormal   Collection Time: 10/24/22  4:41 PM  Result Value Ref Range   Salicylate Lvl <7.0 (L) 7.0 - 30.0 mg/dL    Comment: Performed at [facility], [address] [place], [place] [address]  Urine rapid drug screen (hosp performed)     Status: None   Collection Time: 10/24/22  5:37 PM  Result Value Ref Range   Opiates NONE DETECTED NONE DETECTED   Cocaine NONE DETECTED NONE DETECTED   Benzodiazepines NONE DETECTED NONE DETECTED   Amphetamines NONE DETECTED NONE DETECTED   Tetrahydrocannabinol NONE DETECTED NONE DETECTED   Barbiturates NONE DETECTED NONE DETECTED    Comment: (NOTE) DRUG SCREEN FOR MEDICAL PURPOSES ONLY.  IF CONFIRMATION IS NEEDED FOR ANY PURPOSE, NOTIFY LAB WITHIN 5 DAYS.  LOWEST DETECTABLE LIMITS FOR URINE DRUG SCREEN Drug Class  Cutoff (ng/mL) Amphetamine and metabolites    1000 Barbiturate and metabolites    200 Benzodiazepine                 200 Opiates and metabolites        300 Cocaine and metabolites        300 THC                            50 Performed at [facility], [address], [place], [place] [address]   Pregnancy, urine     Status: None   Collection Time: 10/24/22  6:16 PM  Result Value Ref Range   Preg Test, Ur NEGATIVE NEGATIVE    Comment:        THE SENSITIVITY OF THIS METHODOLOGY IS >25 mIU/mL. Performed at [facility], [address] [place], [place] [address]     Blood Alcohol level:  Lab Results  Component Value Date   [facility] <10 10/24/2022   ETH <10 06/16/2022    Metabolic Disorder Labs:  Lab Results  Component Value Date   HGBA1C 5.7 (H) 06/27/2022   MPG 117 06/27/2022   No results found for: "PROLACTIN" Lab Results  Component Value Date   CHOL 124 06/27/2022   TRIG 40 06/27/2022   HDL 52 06/27/2022   CHOLHDL 2.4 06/27/2022   VLDL 8 06/27/2022   LDLCALC 64 06/27/2022    Current Medications: Current Facility-Administered Medications  Medication Dose Route Frequency Provider Last Rate Last Admin   acetaminophen (TYLENOL) tablet 650 mg  650 mg Oral Q6H PRN Motley-Mangrum, Jadeka A, PMHNP       alum & mag hydroxide-simeth (MAALOX/MYLANTA) 200-200-20 MG/5ML suspension 30 mL  30 mL Oral Q4H PRN Motley-Mangrum, Jadeka A, PMHNP       diphenhydrAMINE (BENADRYL) capsule 50 mg  50 mg Oral TID PRN Motley-Mangrum, Jadeka A, PMHNP       Or   diphenhydrAMINE (BENADRYL) injection 50 mg  50 mg Intramuscular TID PRN Motley-Mangrum, Jadeka A, PMHNP       feeding supplement (ENSURE ENLIVE / ENSURE PLUS) liquid 237 mL  237 mL Oral BID BM Teagen Bucio, Harrold Donath, MD   237 mL at 10/26/22 0917   haloperidol (HALDOL) tablet 5 mg  5 mg Oral TID PRN Motley-Mangrum, Jadeka A, PMHNP       Or   haloperidol lactate (HALDOL) injection 5 mg  5 mg Intramuscular  TID PRN Motley-Mangrum, Geralynn Ochs A, PMHNP       hydrOXYzine (ATARAX) tablet 25 mg  25 mg Oral TID PRN Motley-Mangrum, Jadeka A, PMHNP       LORazepam (ATIVAN) tablet 2 mg  2 mg Oral TID PRN Motley-Mangrum, Jadeka A, PMHNP       Or   LORazepam (ATIVAN) injection 2 mg  2 mg Intramuscular TID PRN Motley-Mangrum, Jadeka A, PMHNP       magnesium hydroxide (MILK OF MAGNESIA) suspension 30 mL  30 mL Oral Daily PRN Motley-Mangrum, Jadeka A, PMHNP       OLANZapine zydis (ZYPREXA) disintegrating tablet 10 mg  10 mg Oral QHS Jessicamarie Amiri, MD       traZODone (DESYREL) tablet 100 mg  100 mg Oral QHS PRN Motley-Mangrum, Jadeka A, PMHNP   100 mg at 10/25/22 2055   PTA Medications: Medications Prior to Admission  Medication Sig Dispense Refill Last Dose   hydrOXYzine (ATARAX) 25 MG tablet Take 1 tablet (25 mg total) by  mouth 3 (three) times daily as needed for anxiety. 30 tablet 0    OLANZapine (ZYPREXA) 10 MG tablet Take 1 tablet (10 mg total) by mouth at bedtime. 30 tablet 0    OLANZapine (ZYPREXA) 5 MG tablet Take 1 tablet (5 mg total) by mouth daily. 30 tablet 0    QUEtiapine (SEROQUEL XR) 50 MG TB24 24 hr tablet Take 2 tablets (100 mg total) by mouth at bedtime. 60 tablet 0    traZODone (DESYREL) 100 MG tablet Take 1 tablet (100 mg total) by mouth at bedtime as needed for sleep. 30 tablet 0     Musculoskeletal: Strength & Muscle Tone: within normal limits Gait & Station: normal Patient leans: N/A  Aims score zero on my exam. No eps on my exam.            Psychiatric Specialty Exam:  Presentation  General Appearance:  Disheveled  Eye Contact: Fair  Speech: Pressured  Speech Volume: Increased  Handedness:No data recorded  Mood and Affect  Mood: Anxious; Irritable  Affect: Labile   Thought Process  Thought Processes: Linear  Duration of Psychotic Symptoms:N/A Past Diagnosis of Schizophrenia or Psychoactive disorder: No  Descriptions of  Associations:Intact  Orientation:Full (Time, Place and Person)  Thought Content:Paranoid Ideation  Hallucinations:Hallucinations: None  Ideas of Reference:Paranoia  Suicidal Thoughts:Suicidal Thoughts: No  Homicidal Thoughts:Homicidal Thoughts: No   Sensorium  Memory: Immediate Fair; Recent Fair; Remote Fair  Judgment: Impaired  Insight: Lacking   Executive Functions  Concentration: Poor  Attention Span: Poor  Recall: Fair  Fund of Knowledge: -- (Patient refusing to elaborate)  Language: Good   Psychomotor Activity  Psychomotor Activity: Psychomotor Activity: Restlessness   Assets  Assets: Communication Skills   Sleep  Sleep: Sleep: Poor    Physical Exam: Physical Exam Vitals reviewed.  Constitutional:      General: She is not in acute distress.    Appearance: She is normal weight. She is not toxic-appearing.  Pulmonary:     Effort: Pulmonary effort is normal.  Neurological:     Mental Status: She is alert.     Motor: No weakness.     Gait: Gait normal.    Review of Systems  Constitutional:  Negative for chills and fever.  Cardiovascular:  Negative for chest pain and palpitations.  Neurological:  Negative for dizziness, tingling, tremors and headaches.  Psychiatric/Behavioral:  Positive for substance abuse. Negative for depression, hallucinations, memory loss and suicidal ideas. The patient is nervous/anxious and has insomnia.   All other systems reviewed and are negative.  Blood pressure 100/67, pulse (!) 119, temperature 98 F (36.7 C), temperature source Oral, resp. rate 18, height 5\' 6"  (1.676 m), weight 59.4 kg, SpO2 100%. Body mass index is 21.14 kg/m.  Treatment Plan Summary: Daily contact with patient to assess and evaluate symptoms and progress in treatment and Medication management  ASSESSMENT:  Diagnoses / Active Problems: Bipolar disorder, type I, current episode is manic  PLAN: Safety and Monitoring:  --   Involuntary admission to inpatient psychiatric unit for safety, stabilization and treatment  -- Daily contact with patient to assess and evaluate symptoms and progress in treatment  -- Patient's case to be discussed in multi-disciplinary team meeting  -- Observation Level : q15 minute checks  -- Vital signs:  q12 hours  -- Precautions: suicide, elopement, and assault  2. Psychiatric Diagnoses and Treatment:    -Stop Seroquel that was started by admitting provider.  Patient reports Zyprexa works better for her -Change  Zyprexa from 5 mg twice daily to 10 mg nightly.  Zyprexa was started by admitting provider.  Patient reports this medication works well for her but was not taking after recent discharge.   ANTIPSYCHOTIC CONSENT : We discussed the risks, benefits, side effects, and alternatives to THE PRESCRIBED ANTIPSYCHOTIC, including but not limited to, the risk of fatigue, sedation, metabolic syndrome, weight gain, movement abnormalities such as tremor & cogwheeling & tardive dyskinesia, temperature sensitivity, photosensitivity, blood pressure changes, heart rhythm effects, potential for medication interactions, and to not take these medications with alcohol or illicit drugs; informed consent was obtained. We discussed the necessity for routine monitoring including rating scales of abnormal movements, blood work, and ekgs, while the patient is prescribed antipsychotic medication.    --  The risks/benefits/side-effects/alternatives to this medication were discussed in detail with the patient and time was given for questions. The patient consents to medication trial.    -- Metabolic profile and EKG monitoring obtained while on an atypical antipsychotic (BMI: Lipid Panel: HbgA1c: QTc:)   -- Encouraged patient to participate in unit milieu and in scheduled group therapies   -- Short Term Goals: Ability to identify changes in lifestyle to reduce recurrence of condition will improve, Ability to  verbalize feelings will improve, Ability to disclose and discuss suicidal ideas, Ability to demonstrate self-control will improve, Ability to identify and develop effective coping behaviors will improve, Ability to maintain clinical measurements within normal limits will improve, Compliance with prescribed medications will improve, and Ability to identify triggers associated with substance abuse/mental health issues will improve  -- Long Term Goals: Improvement in symptoms so as ready for discharge    3. Medical Issues Being Addressed:      4. Discharge Planning:   -- Social work and case management to assist with discharge planning and identification of hospital follow-up needs prior to discharge  -- Estimated LOS: 5-7 days  -- Discharge Concerns: Need to establish a safety plan; Medication compliance and effectiveness  -- Discharge Goals: Return home with outpatient referrals for mental health follow-up including medication management/psychotherapy     I certify that inpatient services furnished can reasonably be expected to improve the patient's condition.    Cristy Hilts, MD 9/27/202412:22 PM   Total Time Spent in Direct Patient Care:  I personally spent 65 minutes on the unit in direct patient care. The direct patient care time included face-to-face time with the patient, reviewing the patient's chart, communicating with other professionals, and coordinating care. Greater than 50% of this time was spent in counseling or coordinating care with the patient regarding goals of hospitalization, psycho-education, and discharge planning needs.   Phineas Inches, MD Psychiatrist

## 2022-10-26 NOTE — Group Note (Signed)
Date:  10/26/2022 Time:  10:14 AM  Group Topic/Focus:  Goals Group:   The focus of this group is to help patients establish daily goals to achieve during treatment and discuss how the patient can incorporate goal setting into their daily lives to aide in recovery.    Participation Level:  Did Not Attend  Deforest Hoyles [facility] 10/26/2022, 10:14 AM

## 2022-10-26 NOTE — Group Note (Signed)
Date:  10/26/2022 Time:  9:05 PM  Group Topic/Focus:  Wrap-Up Group:   The focus of this group is to help patients review their daily goal of treatment and discuss progress on daily workbooks.    Participation Level:  Did Not Attend   [name] 10/26/2022, 9:05 PM

## 2022-10-26 NOTE — Progress Notes (Signed)
   10/26/22 2115  Psych Admission Type (Psych Patients Only)  Admission Status Involuntary  Psychosocial Assessment  Patient Complaints Suspiciousness  Eye Contact Fair  Facial Expression Flat  Affect Appropriate to circumstance  Speech Logical/coherent  Interaction Assertive  Motor Activity Restless  Appearance/Hygiene In scrubs  Behavior Characteristics Anxious;Agitated  Mood Suspicious  Aggressive Behavior  Effect No apparent injury  Thought Process  Coherency Circumstantial;Tangential  Content Blaming others;Preoccupation  Delusions WDL  Perception Hallucinations  Hallucination Auditory  Judgment Impaired  Confusion None  Danger to Self  Current suicidal ideation? Denies  Danger to Others  Danger to Others None reported or observed

## 2022-10-26 NOTE — BH IP Treatment Plan (Addendum)
Interdisciplinary Treatment and Diagnostic Plan Update  10/26/2022 Time of Session: 11:05 Am  Jaclyn Owens MRN: [id number]  Principal Diagnosis: Severe manic bipolar 1 disorder with psychotic behavior (HCC)  Secondary Diagnoses: Principal Problem:   Severe manic bipolar 1 disorder with psychotic behavior (HCC)   Current Medications:  Current Facility-Administered Medications  Medication Dose Route Frequency Provider Last Rate Last Admin   acetaminophen (TYLENOL) tablet 650 mg  650 mg Oral Q6H PRN Motley-Mangrum, Jadeka A, PMHNP       alum & mag hydroxide-simeth (MAALOX/MYLANTA) 200-200-20 MG/5ML suspension 30 mL  30 mL Oral Q4H PRN Motley-Mangrum, Jadeka A, PMHNP       diphenhydrAMINE (BENADRYL) capsule 50 mg  50 mg Oral TID PRN Motley-Mangrum, Jadeka A, PMHNP       Or   diphenhydrAMINE (BENADRYL) injection 50 mg  50 mg Intramuscular TID PRN Motley-Mangrum, Jadeka A, PMHNP       feeding supplement (ENSURE ENLIVE / ENSURE PLUS) liquid 237 mL  237 mL Oral BID BM Massengill, Harrold Donath, MD   237 mL at 10/26/22 0917   haloperidol (HALDOL) tablet 5 mg  5 mg Oral TID PRN Motley-Mangrum, Jadeka A, PMHNP       Or   haloperidol lactate (HALDOL) injection 5 mg  5 mg Intramuscular TID PRN Motley-Mangrum, Geralynn Ochs A, PMHNP       hydrOXYzine (ATARAX) tablet 25 mg  25 mg Oral TID PRN Motley-Mangrum, Jadeka A, PMHNP       LORazepam (ATIVAN) tablet 2 mg  2 mg Oral TID PRN Motley-Mangrum, Jadeka A, PMHNP       Or   LORazepam (ATIVAN) injection 2 mg  2 mg Intramuscular TID PRN Motley-Mangrum, Jadeka A, PMHNP       magnesium hydroxide (MILK OF MAGNESIA) suspension 30 mL  30 mL Oral Daily PRN Motley-Mangrum, Jadeka A, PMHNP       multivitamin with minerals tablet 1 tablet  1 tablet Oral Daily Massengill, Nathan, MD       OLANZapine zydis (ZYPREXA) disintegrating tablet 10 mg  10 mg Oral QHS Massengill, Nathan, MD       traZODone (DESYREL) tablet 100 mg  100 mg Oral QHS PRN Motley-Mangrum, Jadeka A, PMHNP   100  mg at 10/25/22 2055   PTA Medications: Medications Prior to Admission  Medication Sig Dispense Refill Last Dose   hydrOXYzine (ATARAX) 25 MG tablet Take 1 tablet (25 mg total) by mouth 3 (three) times daily as needed for anxiety. 30 tablet 0    OLANZapine (ZYPREXA) 10 MG tablet Take 1 tablet (10 mg total) by mouth at bedtime. 30 tablet 0    OLANZapine (ZYPREXA) 5 MG tablet Take 1 tablet (5 mg total) by mouth daily. 30 tablet 0    QUEtiapine (SEROQUEL XR) 50 MG TB24 24 hr tablet Take 2 tablets (100 mg total) by mouth at bedtime. 60 tablet 0    traZODone (DESYREL) 100 MG tablet Take 1 tablet (100 mg total) by mouth at bedtime as needed for sleep. 30 tablet 0     Patient Stressors:    Patient Strengths:    Treatment Modalities: Medication Management, Group therapy, Case management,  1 to 1 session with clinician, Psychoeducation, Recreational therapy.   Physician Treatment Plan for Primary Diagnosis: Severe manic bipolar 1 disorder with psychotic behavior (HCC) Long Term Goal(s): Improvement in symptoms so as ready for discharge   Short Term Goals: Ability to identify changes in lifestyle to reduce recurrence of condition will improve Ability to verbalize feelings  will improve Ability to disclose and discuss suicidal ideas Ability to demonstrate self-control will improve Ability to identify and develop effective coping behaviors will improve Ability to maintain clinical measurements within normal limits will improve Compliance with prescribed medications will improve Ability to identify triggers associated with substance abuse/mental health issues will improve  Medication Management: Evaluate patient's response, side effects, and tolerance of medication regimen.  Therapeutic Interventions: 1 to 1 sessions, Unit Group sessions and Medication administration.  Evaluation of Outcomes: Not Progressing  Physician Treatment Plan for Secondary Diagnosis: Principal Problem:   Severe manic  bipolar 1 disorder with psychotic behavior (HCC)  Long Term Goal(s): Improvement in symptoms so as ready for discharge   Short Term Goals: Ability to identify changes in lifestyle to reduce recurrence of condition will improve Ability to verbalize feelings will improve Ability to disclose and discuss suicidal ideas Ability to demonstrate self-control will improve Ability to identify and develop effective coping behaviors will improve Ability to maintain clinical measurements within normal limits will improve Compliance with prescribed medications will improve Ability to identify triggers associated with substance abuse/mental health issues will improve     Medication Management: Evaluate patient's response, side effects, and tolerance of medication regimen.  Therapeutic Interventions: 1 to 1 sessions, Unit Group sessions and Medication administration.  Evaluation of Outcomes: Not Progressing   RN Treatment Plan for Primary Diagnosis: Severe manic bipolar 1 disorder with psychotic behavior (HCC) Long Term Goal(s): Knowledge of disease and therapeutic regimen to maintain health will improve  Short Term Goals: Ability to remain free from injury will improve, Ability to verbalize frustration and anger appropriately will improve, Ability to demonstrate self-control, Ability to participate in decision making will improve, Ability to verbalize feelings will improve, Ability to disclose and discuss suicidal ideas, Ability to identify and develop effective coping behaviors will improve, and Compliance with prescribed medications will improve  Medication Management: RN will administer medications as ordered by provider, will assess and evaluate patient's response and provide education to patient for prescribed medication. RN will report any adverse and/or side effects to prescribing provider.  Therapeutic Interventions: 1 on 1 counseling sessions, Psychoeducation, Medication administration, Evaluate  responses to treatment, Monitor vital signs and CBGs as ordered, Perform/monitor CIWA, COWS, AIMS and Fall Risk screenings as ordered, Perform wound care treatments as ordered.  Evaluation of Outcomes: Not Progressing   LCSW Treatment Plan for Primary Diagnosis: Severe manic bipolar 1 disorder with psychotic behavior (HCC) Long Term Goal(s): Safe transition to appropriate next level of care at discharge, Engage patient in therapeutic group addressing interpersonal concerns.  Short Term Goals: Engage patient in aftercare planning with referrals and resources, Increase social support, Increase ability to appropriately verbalize feelings, Increase emotional regulation, Facilitate acceptance of mental health diagnosis and concerns, Facilitate patient progression through stages of change regarding substance use diagnoses and concerns, Identify triggers associated with mental health/substance abuse issues, and Increase skills for wellness and recovery  Therapeutic Interventions: Assess for all discharge needs, 1 to 1 time with Social worker, Explore available resources and support systems, Assess for adequacy in community support network, Educate family and significant other(s) on suicide prevention, Complete Psychosocial Assessment, Interpersonal group therapy.  Evaluation of Outcomes: Not Progressing   Progress in Treatment: Attending groups: No. Participating in groups: No. Taking medication as prescribed: Yes. Toleration medication: Yes. Family/Significant other contact made: No, will contact:  PT declined  Patient understands diagnosis: No. Discussing patient identified problems/goals with staff: Yes. Medical problems stabilized or resolved: Yes. Denies suicidal/homicidal  ideation: Yes. Issues/concerns per patient self-inventory: No.  New problem(s) identified: No, Describe:  None reported   New Short Term/Long Term Goal(s):medication stabilization, elimination of SI thoughts, development  of comprehensive mental wellness plan.    Patient Goals: " Stay on my medication "    Discharge Plan or Barriers: Patient recently admitted. CSW will continue to follow and assess for appropriate referrals and possible discharge planning.    Reason for Continuation of Hospitalization: Mania Medication stabilization  Estimated Length of Stay: 5-7 days   Last 3 [place] Suicide Severity Risk Score: Flowsheet Row Ad[facility] (Current) [facility]24[facility]2022 [facility] from 09/22/2022 in [facility]  C-SSRS RISK CATEGORY No Risk No Risk No Risk       Last PHQ 2/9 Scores:    09/26/2017    2:42 PM  Depression screen PHQ 2/9  Decreased Interest 0  Down, Depressed, Hopeless 0  PHQ - 2 Score 0  Altered sleeping 1  Tired, decreased energy 1  Change in appetite 0  Feeling bad or failure about yourself  0  Trouble concentrating 0  Moving slowly or fidgety/restless 0  Suicidal thoughts 0  PHQ-9 Score 2  Difficult doing work/chores Not difficult at all    Scribe for Treatment Team: Isabella Bowens, LCSWA 10/26/2022 1:20 PM
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
## 2022-10-26 NOTE — Plan of Care (Signed)
Problem: Education: Goal: Knowledge of [place] [facility] Education information/materials will improve Outcome: Not Progressing Goal: Emotional status will improve Outcome: Not Progressing Goal: Mental status will improve Outcome: Not Progressing Goal: Verbalization of understanding the information provided will improve Outcome: Not Progressing   Problem: Activity: Goal: Interest or engagement in activities will improve Outcome: Not Progressing Goal: Sleeping patterns will improve Outcome: Not Progressing   Problem: Coping: Goal: Ability to verbalize frustrations and anger appropriately will improve Outcome: Not Progressing Goal: Ability to demonstrate self-control will improve Outcome: Not Progressing   Problem: Health Behavior/Discharge Planning: Goal: Identification of resources available to assist in meeting health care needs will improve Outcome: Not Progressing Goal: Compliance with treatment plan for underlying cause of condition will improve Outcome: Not Progressing   Problem: Physical Regulation: Goal: Ability to maintain clinical measurements within normal limits will improve Outcome: Not Progressing   Problem: Safety: Goal: Periods of time without injury will increase Outcome: Not Progressing   Problem: Education: Goal: Knowledge of [place] [facility] Education information/materials will improve Outcome: Not Progressing Goal: Emotional status will improve Outcome: Not Progressing Goal: Mental status will improve Outcome: Not Progressing Goal: Verbalization of understanding the information provided will improve Outcome: Not Progressing   Problem: Activity: Goal: Interest or engagement in activities will improve Outcome: Not Progressing Goal: Sleeping patterns will improve Outcome: Not Progressing   Problem: Coping: Goal: Ability to verbalize frustrations and anger appropriately will improve Outcome: Not Progressing Goal: Ability to demonstrate  self-control will improve Outcome: Not Progressing   Problem: Health Behavior/Discharge Planning: Goal: Identification of resources available to assist in meeting health care needs will improve Outcome: Not Progressing Goal: Compliance with treatment plan for underlying cause of condition will improve Outcome: Not Progressing   Problem: Physical Regulation: Goal: Ability to maintain clinical measurements within normal limits will improve Outcome: Not Progressing   Problem: Safety: Goal: Periods of time without injury will increase Outcome: Not Progressing   Problem: Activity: Goal: Will verbalize the importance of balancing activity with adequate rest periods Outcome: Not Progressing   Problem: Education: Goal: Will be free of psychotic symptoms Outcome: Not Progressing Goal: Knowledge of the prescribed therapeutic regimen will improve Outcome: Not Progressing   Problem: Coping: Goal: Coping ability will improve Outcome: Not Progressing Goal: Will verbalize feelings Outcome: Not Progressing   Problem: Health Behavior/Discharge Planning: Goal: Compliance with prescribed medication regimen will improve Outcome: Not Progressing   Problem: Nutritional: Goal: Ability to achieve adequate nutritional intake will improve Outcome: Not Progressing   Problem: Role Relationship: Goal: Ability to communicate needs accurately will improve Outcome: Not Progressing Goal: Ability to interact with others will improve Outcome: Not Progressing   Problem: Safety: Goal: Ability to redirect hostility and anger into socially appropriate behaviors will improve Outcome: Not Progressing Goal: Ability to remain free from injury will improve Outcome: Not Progressing   Problem: Self-Care: Goal: Ability to participate in self-care as condition permits will improve Outcome: Not Progressing   Problem: Self-Concept: Goal: Will verbalize positive feelings about self Outcome: Not Progressing    Problem: Education: Goal: Utilization of techniques to improve thought processes will improve Outcome: Not Progressing Goal: Knowledge of the prescribed therapeutic regimen will improve Outcome: Not Progressing   Problem: Activity: Goal: Interest or engagement in leisure activities will improve Outcome: Not Progressing Goal: Imbalance in normal sleep/wake cycle will improve Outcome: Not Progressing   Problem: Coping: Goal: Coping ability will improve Outcome: Not Progressing Goal: Will verbalize feelings Outcome: Not Progressing   Problem:  Health Behavior/Discharge Planning: Goal: Ability to make decisions will improve Outcome: Not Progressing Goal: Compliance with therapeutic regimen will improve Outcome: Not Progressing   Problem: Role Relationship: Goal: Will demonstrate positive changes in social behaviors and relationships Outcome: Not Progressing   Problem: Safety: Goal: Ability to disclose and discuss suicidal ideas will improve Outcome: Not Progressing Goal: Ability to identify and utilize support systems that promote safety will improve Outcome: Not Progressing

## 2022-10-26 NOTE — BHH Suicide Risk Assessment (Signed)
[facility] Admission Suicide Risk Assessment   Nursing information obtained from:  Patient Demographic factors:  Living alone, Unemployed, Adolescent or young adult Current Mental Status:  NA Loss Factors:  Loss of significant relationship, Financial problems / change in socioeconomic status ("My children are in DSS custody") Historical Factors:  Impulsivity Risk Reduction Factors:     Total Time spent with patient: 30 minutes Principal Problem: Severe manic bipolar 1 disorder with psychotic behavior (HCC) Diagnosis:  Principal Problem:   Severe manic bipolar 1 disorder with psychotic behavior (HCC)  Subjective Data:  Patient is a 27 year old female with a past psychiatric history of bipolar disorder who was admitted to the psychiatric hospital from [facility] after being taken there from jail for psychosis, mood lability, hostility and verbal combativeness.  Patient was apparently IVC at the jail.   Prior to admission outpatient psychiatric medications: Patient was not taking her psychiatric medications since recent discharge from the psychiatric hospitalization.   On my examination today, the patient reports that she has been in jail for about 3 weeks after "acting crazy at my grandmother's house".  Patient reports that she was out of medication after discharge from recent psychiatric hospitalization.  She reports that Zyprexa was effective for her during the previous hospitalization.  Initially the patient denies having any significant psychiatric symptoms in jail or prior to going to jail, then later states that "I was acting crazy in jail" but the patient denies experiencing any other psychosis such as hallucinations or paranoia.  Her thoughts are somewhat disorganized and tangential.  She is somewhat paranoid.  There is significant documentation patient was responding to internal stimuli prior to admission although the patient denies this at this time.  Patient denies any substance use  recently.  Patient does report that her mood is irritable.  She reports significant mood swings from being very depressed to very elated, to very irritable.  She reports decreased need for sleep and increased energy.  She demonstrates some pressured speech and flight of ideas.  She reports that appetite is much less with being unsure if she has any weight changes recently.  Concentration is poor.  At this time she denies any SI or HI.  She reports that anxiety is at a very high and severe level, generalized, acute on chronic, with difficulty maintaining concentration, impairment of sleep, and muscle tension.  Denies having any panic attacks.  Patient reports a history of trauma, but will not disclose details of traumatic events or her psychological responses to trauma stating "that is all in the past".   Past psychiatric history: Patient reports previous psychiatric diagnosis of bipolar disorder.  Reports 3 psychiatric hospitalizations in the past.  Reports a history of overdose in 2018 as suicide attempt.  No current psychiatric medications.  Patient reports psychiatric medication history is significant for: Seroquel, Zyprexa, trazodone, hydroxyzine.   Past medical history: The patient denies any acute or chronic medical illness.  Patient reports last menstrual period was 3.5 weeks ago.  Patient denies being on any current contraception and "I plan to have anymore kids".  Denies any seizure history or head trauma.   Substance use history is significant for marijuana and smoking black amounts, but not in the last few weeks.  Denies any significant history of alcohol use or other illicit drug use.   Family history: Patient denies any significant family history of psychiatric illness or known family history of suicide attempts.   Social history: Patient reports she lives alone, has  3 children that are currently in foster care.  Is unemployed.   Continued Clinical Symptoms:    The "Alcohol Use Disorders  Identification Test", Guidelines for Use in Primary Care, Second Edition.  World Science writer [facility]). Score between 0-7:  no or low risk or alcohol related problems. Score between 8-15:  moderate risk of alcohol related problems. Score between 16-19:  high risk of alcohol related problems. Score 20 or above:  warrants further diagnostic evaluation for alcohol dependence and treatment.   CLINICAL FACTORS:   Severe Anxiety and/or Agitation Bipolar Disorder:   manic  Unstable or Poor Therapeutic Relationship Previous Psychiatric Diagnoses and Treatments    Psychiatric Specialty Exam:  Presentation  General Appearance:  Disheveled  Eye Contact: Fair  Speech: Pressured  Speech Volume: Increased  Handedness:No data recorded  Mood and Affect  Mood: Anxious; Irritable  Affect: Labile   Thought Process  Thought Processes: Linear  Descriptions of Associations:Intact  Orientation:Full (Time, Place and Person)  Thought Content:Paranoid Ideation  History of Schizophrenia/Schizoaffective disorder:No  Duration of Psychotic Symptoms:Less than six months  Hallucinations:Hallucinations: None  Ideas of Reference:Paranoia  Suicidal Thoughts:Suicidal Thoughts: No  Homicidal Thoughts:Homicidal Thoughts: No   Sensorium  Memory: Immediate Fair; Recent Fair; Remote Fair  Judgment: Impaired  Insight: Lacking   Executive Functions  Concentration: Poor  Attention Span: Poor  Recall: Fair  Fund of Knowledge: -- (Patient refusing to elaborate)  Language: Good   Psychomotor Activity  Psychomotor Activity: Psychomotor Activity: Restlessness   Assets  Assets: Communication Skills   Sleep  Sleep: Sleep: Poor    Physical Exam: Physical Exam See H&P  ROS See H&P   Blood pressure 100/67, pulse (!) 119, temperature 98 F (36.7 C), temperature source Oral, resp. rate 18, height 5\' 6"  (1.676 m), weight 59.4 kg, SpO2 100%. Body mass index  is 21.14 kg/m.   COGNITIVE FEATURES THAT CONTRIBUTE TO RISK:  None    SUICIDE RISK:   Moderate:  Currently manic, currently denying any suicidal ideation. Poor self-control, limited dysphoria/symptomatology, some risk factors present, and identifiable protective factors, including available and accessible social support.  PLAN OF CARE: See H&P    I certify that inpatient services furnished can reasonably be expected to improve the patient's condition.   Cristy Hilts, MD 10/26/2022, 12:22 PM

## 2022-10-26 NOTE — BHH Group Notes (Signed)

## 2022-10-26 NOTE — Plan of Care (Signed)
  Problem: Education: Goal: Emotional status will improve Outcome: Progressing Goal: Mental status will improve Outcome: Progressing   Problem: Activity: Goal: Sleeping patterns will improve Outcome: Progressing   Problem: Safety: Goal: Periods of time without injury will increase Outcome: Progressing   

## 2022-10-26 NOTE — Group Note (Signed)
Recreation Therapy Group Note   Group Topic:Problem Solving  Group Date: 10/26/2022 Start Time: 1040 End Time: 1105 Facilitators: Kevontay Burks-McCall, LRT,CTRS Location: [address] Dayroom   Group Topic: Communication, Team Building, Problem Solving  Goal Area(s) Addresses:  Patient will effectively work with peer towards shared goal.  Patient will identify skills used to make activity successful.  Patient will share challenges and verbalize solution-driven approaches used. Patient will identify how skills used during activity can be used to reach post d/c goals.   Intervention: STEM Activity   Group Description: [employer]. Patients were provided the following materials: 5 drinking straws, 5 rubber bands, 5 paper clips, 2 index cards, 2 drinking cups, and 2 toilet paper rolls. Using the provided materials patients were asked to build a launching mechanism to launch a ping pong ball across the room, approximately 10 feet. Patients were divided into teams of 3-5. Instructions required all materials be incorporated into the device, functionality of items left to the peer group's discretion.  Education: Pharmacist, community, Scientist, physiological, Air cabin crew, Building control surveyor.   Education Outcome: Acknowledges education/In group clarification offered/Needs additional education.   Affect/Mood: N/A   Participation Level: Did not attend    Clinical Observations/Individualized Feedback:     Plan: Continue to engage patient in RT group sessions 2-3x/week.   Shalisa Mcquade-McCall, LRT,CTRS 10/26/2022 1:08 PM

## 2022-10-27 MED ORDER — OLANZAPINE 15 MG PO TBDP
15.0000 mg | ORAL_TABLET | Freq: Every day | ORAL | Status: DC
  Administered 2022-10-27: 15 mg via ORAL
  Filled 2022-10-27 (×2): qty 1

## 2022-10-27 NOTE — Progress Notes (Signed)
   10/27/22 1000  Psych Admission Type (Psych Patients Only)  Admission Status Involuntary  Psychosocial Assessment  Patient Complaints Suspiciousness  Eye Contact Fair  Facial Expression Flat  Affect Appropriate to circumstance  Speech Logical/coherent  Interaction Assertive;Isolative  Motor Activity Restless  Appearance/Hygiene In scrubs  Behavior Characteristics Anxious  Mood Suspicious  Thought Process  Coherency Circumstantial;Tangential  Content Blaming others;Preoccupation  Delusions None reported or observed  Perception Hallucinations  Hallucination Auditory  Judgment Impaired  Confusion None  Danger to Self  Current suicidal ideation? Denies  Danger to Others  Danger to Others None reported or observed

## 2022-10-27 NOTE — Group Note (Signed)
Date:  10/27/2022 Time:  8:59 PM  Group Topic/Focus:  Wrap-Up Group:   The focus of this group is to help patients review their daily goal of treatment and discuss progress on daily workbooks.    Participation Level:  Did Not Attend  [name] 10/27/2022, 8:59 PM

## 2022-10-27 NOTE — Progress Notes (Signed)
[facility] MD Progress Note  10/27/2022 2:36 PM Jaclyn Owens  MRN:  [id number] Subjective:  Patient is a 27 year old female with a past psychiatric history of bipolar disorder who was admitted to the psychiatric hospital from [facility] after being taken there from jail for psychosis, mood lability, hostility and verbal combativeness.  Patient was apparently IVC at the jail.   Prior to admission outpatient psychiatric medications: Patient was not taking her psychiatric medications since recent discharge from the psychiatric hospitalization.   Overnight patient only needed PRNs were trazodone 100mg  for sleep and MoM for subjective constipation and there were no behavior concerns from nursing.   On assessment today patient reports that she feels she slept very well last night. Patient reports that she is in a "good" mood but continues to prefer to stay isolated. Patient reports that she does not think it is a requirement for her to have to go to group sessions and does not see it as beneficial. Patient reports that she will be cordial if she passes's people in the hall and will follow basic rules and not mistreat staff, but group therapy does not align with her  and she endorses that it would severe no benefit. Patient reports that she will take her medications but she does not see why she has them or why she is in the hospital. Patient reports that she is eating well. Patient denies SI, HI, and AVH. Patietn reports that she will get up to walk the halls sometimes and that she finally had a BM this AM but feels she still has more to pass.   When asked about the symbol that is drawn all over patient's room, patient becomes guarded and does not wish to talk about this, but does reports that the image just came to her and is something she will probably get tattooed very soon. Patient refuses to disclose if there is any meaning attached to the symbol. Patient reports that she does not really wish to talk  more as she does not think she needs to be in the hospital or on medications, but will take them so she can eventually be discharged.  Principal Problem: Severe manic bipolar 1 disorder with psychotic behavior (HCC) Diagnosis: Principal Problem:   Severe manic bipolar 1 disorder with psychotic behavior (HCC)  Total Time spent with patient: 20 minutes  Past Psychiatric History: Patient reports previous psychiatric diagnosis of bipolar disorder. Reports 3 psychiatric hospitalizations in the past. Reports a history of overdose in 2018 as suicide attempt. No current psychiatric medications. Patient reports psychiatric medication history is significant for: Seroquel, Zyprexa, trazodone, hydroxyzine.   Past Medical History:  Past Medical History:  Diagnosis Date   Asthma    HSV (herpes simplex virus) infection     Past Surgical History:  Procedure Laterality Date   ARM WOUND REPAIR / CLOSURE     Family History:  Family History  Problem Relation Age of Onset   Asthma Mother    Diabetes Maternal Aunt    Cancer Maternal Grandmother    Family Psychiatric  History: Patient denies any significant family history of psychiatric illness or known family history of suicide attempts.  Social History:  Social History   Substance and Sexual Activity  Alcohol Use Not Currently   Comment: UTA, pt is not cooperative with triage questions 06/16/22     Social History   Substance and Sexual Activity  Drug Use Yes   Types: Marijuana    Social History  Socioeconomic History   Marital status: Single    Spouse name: Not on file   Number of children: 1   Years of education: 62   Highest education level: High school graduate  Occupational History   Not on file  Tobacco Use   Smoking status: Former    Types: Cigarettes, Cigars   Smokeless tobacco: Never  Vaping Use   Vaping status: Former  Substance and Sexual Activity   Alcohol use: Not Currently    Comment: UTA, pt is not cooperative with  triage questions 06/16/22   Drug use: Yes    Types: Marijuana   Sexual activity: Yes    Birth control/protection: None  Other Topics Concern   Not on file  Social History Narrative   Not on file   Social Determinants of Health   Financial Resource Strain: Low Risk  (01/10/2018)   Overall Financial Resource Strain (CARDIA)    Difficulty of Paying Living Expenses: Not very hard  Food Insecurity: No Food Insecurity (10/25/2022)   Hunger Vital Sign    Worried About Running Out of Food in the Last Year: Never true    Ran Out of Food in the Last Year: Never true  Transportation Needs: No Transportation Needs (10/25/2022)   PRAPARE - Administrator, Civil Service (Medical): No    Lack of Transportation (Non-Medical): No  Physical Activity: Inactive (01/10/2018)   Exercise Vital Sign    Days of Exercise per Week: 0 days    Minutes of Exercise per Session: 0 min  Stress: No Stress Concern Present (01/10/2018)   [employer] of Occupational Health - Occupational Stress Questionnaire    Feeling of Stress : Not at all  Social Connections: Somewhat Isolated (01/10/2018)   Social Connection and Isolation Panel [NHANES]    Frequency of Communication with Friends and Family: More than three times a week    Frequency of Social Gatherings with Friends and Family: More than three times a week    Attends Religious Services: More than 4 times per year    Active Member of [employer] or Organizations: No    Attends Banker Meetings: Never    Marital Status: Never married   Additional Social History:                         Sleep: Fair  Appetite:  Good  Current Medications: Current Facility-Administered Medications  Medication Dose Route Frequency Provider Last Rate Last Admin   acetaminophen (TYLENOL) tablet 650 mg  650 mg Oral Q6H PRN Motley-Mangrum, Jadeka A, PMHNP       alum & mag hydroxide-simeth (MAALOX/MYLANTA) 200-200-20 MG/5ML suspension 30 mL  30 mL  Oral Q4H PRN Motley-Mangrum, Jadeka A, PMHNP       diphenhydrAMINE (BENADRYL) capsule 50 mg  50 mg Oral TID PRN Motley-Mangrum, Jadeka A, PMHNP       Or   diphenhydrAMINE (BENADRYL) injection 50 mg  50 mg Intramuscular TID PRN Motley-Mangrum, Jadeka A, PMHNP       feeding supplement (ENSURE ENLIVE / ENSURE PLUS) liquid 237 mL  237 mL Oral BID BM Massengill, Nathan, MD   237 mL at 10/27/22 0852   haloperidol (HALDOL) tablet 5 mg  5 mg Oral TID PRN Motley-Mangrum, Jadeka A, PMHNP       Or   haloperidol lactate (HALDOL) injection 5 mg  5 mg Intramuscular TID PRN Motley-Mangrum, Geralynn Ochs A, PMHNP       hydrOXYzine (ATARAX)  tablet 25 mg  25 mg Oral TID PRN Motley-Mangrum, Jadeka A, PMHNP       LORazepam (ATIVAN) tablet 2 mg  2 mg Oral TID PRN Motley-Mangrum, Geralynn Ochs A, PMHNP       Or   LORazepam (ATIVAN) injection 2 mg  2 mg Intramuscular TID PRN Motley-Mangrum, Jadeka A, PMHNP       magnesium hydroxide (MILK OF MAGNESIA) suspension 30 mL  30 mL Oral Daily PRN Motley-Mangrum, Jadeka A, PMHNP   30 mL at 10/26/22 2039   multivitamin with minerals tablet 1 tablet  1 tablet Oral Daily Massengill, Nathan, MD       OLANZapine zydis (ZYPREXA) disintegrating tablet 15 mg  15 mg Oral QHS Eliseo Gum B, MD       traZODone (DESYREL) tablet 100 mg  100 mg Oral QHS PRN Motley-Mangrum, Jadeka A, PMHNP   100 mg at 10/26/22 2038    Lab Results: No results found for this or any previous visit (from the past 48 hour(s)).  Blood Alcohol level:  Lab Results  Component Value Date   ETH <10 10/24/2022   ETH <10 06/16/2022    Metabolic Disorder Labs: Lab Results  Component Value Date   HGBA1C 5.7 (H) 06/27/2022   MPG 117 06/27/2022   No results found for: "PROLACTIN" Lab Results  Component Value Date   CHOL 124 06/27/2022   TRIG 40 06/27/2022   HDL 52 06/27/2022   CHOLHDL 2.4 06/27/2022   VLDL 8 06/27/2022   LDLCALC 64 06/27/2022    Physical Findings: AIMS:  , ,  ,  ,    CIWA:    COWS:      Musculoskeletal: Strength & Muscle Tone: within normal limits Gait & Station: normal Patient leans: N/A  Psychiatric Specialty Exam:  Presentation  General Appearance:  Bizarre (wearing only underwear, intially covering self in bed with blanket however towards end of assessment patient up comfortably talking to provider with nothing but uderwear while straghetning bed)  Eye Contact: Fair  Speech: Pressured  Speech Volume: Increased  Handedness:No data recorded  Mood and Affect  Mood: Irritable  Affect: Congruent   Thought Process  Thought Processes: Linear  Descriptions of Associations:Intact  Orientation:Full (Time, Place and Person)  Thought Content:Paranoid Ideation  History of Schizophrenia/Schizoaffective disorder:No  Duration of Psychotic Symptoms:Less than six months  Hallucinations:Hallucinations: None  Ideas of Reference:Paranoia  Suicidal Thoughts:Suicidal Thoughts: No  Homicidal Thoughts:Homicidal Thoughts: No   Sensorium  Memory: Immediate Fair; Recent Fair  Judgment: Impaired  Insight: Lacking   Executive Functions  Concentration: Fair  Attention Span: Fair  Recall: [employer] of Knowledge: Fair  Language: Fair   Psychomotor Activity  Psychomotor Activity: Psychomotor Activity: Normal   Assets  Assets: Resilience   Sleep  Sleep: Sleep: Fair    Physical Exam: Physical Exam HENT:     Head: Normocephalic and atraumatic.  Pulmonary:     Effort: Pulmonary effort is normal.  Skin:    General: Skin is dry.  Neurological:     Mental Status: She is alert.    Review of Systems  Psychiatric/Behavioral:  Negative for hallucinations and suicidal ideas. The patient does not have insomnia.    Blood pressure 96/64, pulse (!) 118, temperature 98.2 F (36.8 C), temperature source Oral, resp. rate 17, height 5\' 6"  (1.676 m), weight 59.4 kg, SpO2 100%. Body mass index is 21.14 kg/m.   Treatment Plan  Summary: Daily contact with patient to assess and evaluate symptoms and progress in  treatment and Medication management   ASSESSMENT: Patient continues to be a bit irritable, but is endorsing feeling more rested and has documented sleep time of about 5.5h. Patient received a total of 15mg  Zyprexa yesterday, will continue today as patient is seeing some improvement at this dose and previously was dc'd on 15mg  Zyprexa with 100mg  of Seroquel. Patient still has some bizarre behaviors and mild paranoia.   Diagnoses / Active Problems: Bipolar disorder, type I, current episode is manic   PLAN: Safety and Monitoring:             --  Involuntary admission to inpatient psychiatric unit for safety, stabilization and treatment             -- Daily contact with patient to assess and evaluate symptoms and progress in treatment             -- Patient's case to be discussed in multi-disciplinary team meeting             -- Observation Level : q15 minute checks             -- Vital signs:  q12 hours             -- Precautions: suicide, elopement, and assault   2. Psychiatric Diagnoses and Treatment:               - Change Zyprexa to 15mg  QHS   PGY-4 Bobbye Morton, MD 10/27/2022, 2:36 PM

## 2022-10-27 NOTE — Progress Notes (Signed)
   10/27/22 2300  Psych Admission Type (Psych Patients Only)  Admission Status Involuntary  Psychosocial Assessment  Patient Complaints Suspiciousness  Eye Contact Fair  Facial Expression Animated  Affect Appropriate to circumstance  Speech Logical/coherent  Interaction Assertive  Motor Activity Fidgety  Appearance/Hygiene Unremarkable  Behavior Characteristics Cooperative;Appropriate to situation  Mood Anxious  Thought Process  Coherency Tangential  Content Preoccupation  Delusions None reported or observed  Perception WDL  Hallucination None reported or observed  Judgment Poor  Confusion None  Danger to Self  Current suicidal ideation? Denies  Danger to Others  Danger to Others None reported or observed

## 2022-10-28 MED ORDER — OLANZAPINE 10 MG PO TBDP
20.0000 mg | ORAL_TABLET | Freq: Every day | ORAL | Status: DC
  Administered 2022-10-28 – 2022-10-30 (×3): 20 mg via ORAL
  Filled 2022-10-28 (×4): qty 2

## 2022-10-28 NOTE — Group Note (Signed)
Date:  10/28/2022 Time:  9:06 PM  Group Topic/Focus:  Wrap-Up Group:   The focus of this group is to help patients review their daily goal of treatment and discuss progress on daily workbooks.    Participation Level:  Did Not Attend   [name] 10/28/2022, 9:06 PM

## 2022-10-28 NOTE — Progress Notes (Signed)
   10/28/22 2100  Psych Admission Type (Psych Patients Only)  Admission Status Involuntary  Psychosocial Assessment  Patient Complaints Suspiciousness  Eye Contact Poor  Facial Expression Animated  Affect Inconsistent with thought content  Speech Logical/coherent  Interaction Avoidant  Motor Activity Fidgety  Appearance/Hygiene Unremarkable  Behavior Characteristics Guarded  Mood Apprehensive  Thought Process  Coherency Flight of ideas  Content Preoccupation  Delusions None reported or observed  Perception WDL  Hallucination None reported or observed  Judgment Poor  Confusion None  Danger to Self  Current suicidal ideation? Denies  Danger to Others  Danger to Others None reported or observed

## 2022-10-28 NOTE — Progress Notes (Signed)
   Patient is awake with stomach pain. Requesting something to help with bowel movement. States no bowel movement in 2 days. Patient states she is unable to sleep with the pain.

## 2022-10-28 NOTE — Progress Notes (Signed)
I assumed care for Jaclyn Owens at about 08:00.  She was resting in her bedroom , awake, in no apparent distress. She insists that the is leaving the hospital either today or tomorrow, declined her am meds, MD made aware. She otherwise denied any avh/hi/si at the time of my assessment though she appears to be responding to internal stimuli. Care for patient has been handed over to oncoming RN at thus time.

## 2022-10-28 NOTE — Progress Notes (Signed)
[facility] MD Progress Note  10/28/2022 12:25 PM Jaclyn Owens  MRN:  [id number] Subjective:  Patient is a 27 year old female with a past psychiatric history of bipolar disorder who was admitted to the psychiatric hospital from [facility] after being taken there from jail for psychosis, mood lability, hostility and verbal combativeness.  Patient was apparently IVC at the jail.   Prior to admission outpatient psychiatric medications: Patient was not taking her psychiatric medications since recent discharge from the psychiatric hospitalization.  Overnight there were no reported behavior concerns, and patient did not take any PRNs.   On assessment this AM it was fairly difficult for provider as patient began speaking with pressured speech and endorsed that she only wanted to talk about going home. Patient made it difficult for provider to ask questions or have a conversation as patient's speech was so pressured and patient displayed narrow-minded thoughts. When provider was able to interject, patient continued to fixate on discharge and how her grandmother will be here without her calling, to pick her up. Patient also endorsed some illogical thoughts about the practice of medicine that hinted at paranoia, and then demanded that she wanted a new "discharge nurse." Provider made it clear 2x, that provider was the physician and was not sure what she meant by dscharge nurse. Eventually the patient decided to go request a new "discharge nurse" even though writer had clarified position.   Patient adamantly denied SI, HI, and AVH. Patient again refused to answer about the symbol all over her room and placement of them in her room, but later showed that she was erasing them or throwing the drawings away, because she was annoyed that provider has asked about it both days. Patient endorsed that she will continue to take her medication, as it helps her not AH. Patient denied having delusions. Patient also endorsed  that she is anxious to go home.   Principal Problem: Severe manic bipolar 1 disorder with psychotic behavior (HCC) Diagnosis: Principal Problem:   Severe manic bipolar 1 disorder with psychotic behavior (HCC)  Total Time spent with patient: 15 minutes  Past Psychiatric History:  Patient reports previous psychiatric diagnosis of bipolar disorder. Reports 3 psychiatric hospitalizations in the past. Reports a history of overdose in 2018 as suicide attempt. No current psychiatric medications. Patient reports psychiatric medication history is significant for: Seroquel, Zyprexa, trazodone, hydroxyzine.   Past Medical History:  Past Medical History:  Diagnosis Date   Asthma    HSV (herpes simplex virus) infection     Past Surgical History:  Procedure Laterality Date   ARM WOUND REPAIR / CLOSURE     Family History:  Family History  Problem Relation Age of Onset   Asthma Mother    Diabetes Maternal Aunt    Cancer Maternal Grandmother    Family Psychiatric  History:  Patient denies any significant family history of psychiatric illness or known family history of suicide attempts.  Social History:  Social History   Substance and Sexual Activity  Alcohol Use Not Currently   Comment: UTA, pt is not cooperative with triage questions 06/16/22     Social History   Substance and Sexual Activity  Drug Use Yes   Types: Marijuana    Social History   Socioeconomic History   Marital status: Single    Spouse name: Not on file   Number of children: 1   Years of education: 13   Highest education level: High school graduate  Occupational History   Not on  file  Tobacco Use   Smoking status: Former    Types: Cigarettes, Cigars   Smokeless tobacco: Never  Vaping Use   Vaping status: Former  Substance and Sexual Activity   Alcohol use: Not Currently    Comment: UTA, pt is not cooperative with triage questions 06/16/22   Drug use: Yes    Types: Marijuana   Sexual activity: Yes    Birth  control/protection: None  Other Topics Concern   Not on file  Social History Narrative   Not on file   Social Determinants of Health   Financial Resource Strain: Low Risk  (01/10/2018)   Overall Financial Resource Strain (CARDIA)    Difficulty of Paying Living Expenses: Not very hard  Food Insecurity: No Food Insecurity (10/25/2022)   Hunger Vital Sign    Worried About Running Out of Food in the Last Year: Never true    Ran Out of Food in the Last Year: Never true  Transportation Needs: No Transportation Needs (10/25/2022)   PRAPARE - Administrator, Civil Service (Medical): No    Lack of Transportation (Non-Medical): No  Physical Activity: Inactive (01/10/2018)   Exercise Vital Sign    Days of Exercise per Week: 0 days    Minutes of Exercise per Session: 0 min  Stress: No Stress Concern Present (01/10/2018)   [employer] of Occupational Health - Occupational Stress Questionnaire    Feeling of Stress : Not at all  Social Connections: Somewhat Isolated (01/10/2018)   Social Connection and Isolation Panel [NHANES]    Frequency of Communication with Friends and Family: More than three times a week    Frequency of Social Gatherings with Friends and Family: More than three times a week    Attends Religious Services: More than 4 times per year    Active Member of [employer] or Organizations: No    Attends Banker Meetings: Never    Marital Status: Never married   Additional Social History:                         Sleep: Fair  Appetite:  Good  Current Medications: Current Facility-Administered Medications  Medication Dose Route Frequency Provider Last Rate Last Admin   acetaminophen (TYLENOL) tablet 650 mg  650 mg Oral Q6H PRN Motley-Mangrum, Jadeka A, PMHNP       alum & mag hydroxide-simeth (MAALOX/MYLANTA) 200-200-20 MG/5ML suspension 30 mL  30 mL Oral Q4H PRN Motley-Mangrum, Jadeka A, PMHNP       diphenhydrAMINE (BENADRYL) capsule 50 mg  50  mg Oral TID PRN Motley-Mangrum, Jadeka A, PMHNP       Or   diphenhydrAMINE (BENADRYL) injection 50 mg  50 mg Intramuscular TID PRN Motley-Mangrum, Jadeka A, PMHNP       feeding supplement (ENSURE ENLIVE / ENSURE PLUS) liquid 237 mL  237 mL Oral BID BM Massengill, Harrold Donath, MD   237 mL at 10/28/22 0917   haloperidol (HALDOL) tablet 5 mg  5 mg Oral TID PRN Motley-Mangrum, Jadeka A, PMHNP       Or   haloperidol lactate (HALDOL) injection 5 mg  5 mg Intramuscular TID PRN Motley-Mangrum, Jadeka A, PMHNP       hydrOXYzine (ATARAX) tablet 25 mg  25 mg Oral TID PRN Motley-Mangrum, Jadeka A, PMHNP       LORazepam (ATIVAN) tablet 2 mg  2 mg Oral TID PRN Motley-Mangrum, Ezra Sites, PMHNP       Or  LORazepam (ATIVAN) injection 2 mg  2 mg Intramuscular TID PRN Motley-Mangrum, Jadeka A, PMHNP       magnesium hydroxide (MILK OF MAGNESIA) suspension 30 mL  30 mL Oral Daily PRN Motley-Mangrum, Jadeka A, PMHNP   30 mL at 10/26/22 2039   multivitamin with minerals tablet 1 tablet  1 tablet Oral Daily Massengill, Nathan, MD       OLANZapine zydis (ZYPREXA) disintegrating tablet 20 mg  20 mg Oral QHS Eliseo Gum B, MD       traZODone (DESYREL) tablet 100 mg  100 mg Oral QHS PRN Motley-Mangrum, Jadeka A, PMHNP   100 mg at 10/26/22 2038    Lab Results: No results found for this or any previous visit (from the past 48 hour(s)).  Blood Alcohol level:  Lab Results  Component Value Date   ETH <10 10/24/2022   ETH <10 06/16/2022    Metabolic Disorder Labs: Lab Results  Component Value Date   HGBA1C 5.7 (H) 06/27/2022   MPG 117 06/27/2022   No results found for: "PROLACTIN" Lab Results  Component Value Date   CHOL 124 06/27/2022   TRIG 40 06/27/2022   HDL 52 06/27/2022   CHOLHDL 2.4 06/27/2022   VLDL 8 06/27/2022   LDLCALC 64 06/27/2022    Physical Findings: AIMS:  , ,  ,  ,    CIWA:    COWS:     Musculoskeletal: Strength & Muscle Tone: within normal limits Gait & Station: normal Patient leans:  N/A  Psychiatric Specialty Exam:  Presentation  General Appearance:  Casual (wearing a dress today)  Eye Contact: Fair  Speech: Clear and Coherent; Pressured  Speech Volume: Increased  Handedness:No data recorded  Mood and Affect  Mood: Irritable; Anxious  Affect: Congruent   Thought Process  Thought Processes: Linear  Descriptions of Associations:Intact  Orientation:Full (Time, Place and Person)  Thought Content:Perseveration  History of Schizophrenia/Schizoaffective disorder:No  Duration of Psychotic Symptoms:Less than six months  Hallucinations:Hallucinations: None  Ideas of Reference:None  Suicidal Thoughts:Suicidal Thoughts: No  Homicidal Thoughts:Homicidal Thoughts: No   Sensorium  Memory: Immediate Fair; Recent Good  Judgment: Poor  Insight: Shallow   Executive Functions  Concentration: Fair  Attention Span: Fair  Recall: Fair  Fund of Knowledge: Fair  Language: Fair   Psychomotor Activity  Psychomotor Activity: Psychomotor Activity: Normal   Assets  Assets: Social Support; Resilience   Sleep  Sleep: Sleep: Fair Number of Hours of Sleep: 5.1    Physical Exam: Physical Exam Constitutional:      Appearance: Normal appearance.  HENT:     Head: Normocephalic and atraumatic.  Pulmonary:     Effort: Pulmonary effort is normal.  Neurological:     Mental Status: She is alert and oriented to person, place, and time.    Review of Systems  Psychiatric/Behavioral:  Negative for hallucinations and suicidal ideas.    Blood pressure 99/66, pulse 97, temperature 98.1 F (36.7 C), temperature source Oral, resp. rate 17, height 5\' 6"  (1.676 m), weight 59.4 kg, SpO2 100%. Body mass index is 21.14 kg/m.   Treatment Plan Summary: Daily contact with patient to assess and evaluate symptoms and progress in treatment and Medication management  ASSESSMENT: Patient continues to be irritable, with pressured speech and  thoughts. Patient continues to perseverate in her thought process,  making it extremely difficult to communicate. Will increase Zyprexa to 20mg  at bedtime.    Diagnoses / Active Problems: Bipolar disorder, type I, current episode is manic  PLAN: Safety and Monitoring:             --  Involuntary admission to inpatient psychiatric unit for safety, stabilization and treatment             -- Daily contact with patient to assess and evaluate symptoms and progress in treatment             -- Patient's case to be discussed in multi-disciplinary team meeting             -- Observation Level : q15 minute checks             -- Vital signs:  q12 hours             -- Precautions: suicide, elopement, and assault   2. Psychiatric Diagnoses and Treatment:               - Increase Zyprexa to 20mg  qhs   PGY-4 Bobbye Morton, MD 10/28/2022, 12:25 PM

## 2022-10-28 NOTE — Plan of Care (Signed)
  Problem: Education: Goal: Knowledge of [facility] Education information/materials will improve Outcome: Progressing Goal: Emotional status will improve Outcome: Progressing Goal: Mental status will improve Outcome: Progressing Goal: Verbalization of understanding the information provided will improve Outcome: Progressing   

## 2022-10-29 DIAGNOSIS — F312 Bipolar disorder, current episode manic severe with psychotic features: Secondary | ICD-10-CM | POA: Diagnosis not present

## 2022-10-29 MED ORDER — QUETIAPINE FUMARATE ER 50 MG PO TB24
100.0000 mg | ORAL_TABLET | Freq: Every day | ORAL | Status: DC
  Administered 2022-10-29 – 2022-10-30 (×2): 100 mg via ORAL
  Filled 2022-10-29 (×4): qty 2

## 2022-10-29 NOTE — Plan of Care (Signed)
  Problem: Education: Goal: Emotional status will improve Outcome: Progressing Goal: Mental status will improve Outcome: Progressing   Problem: Activity: Goal: Interest or engagement in activities will improve Outcome: Progressing Goal: Sleeping patterns will improve Outcome: Progressing

## 2022-10-29 NOTE — B[facility] Group Notes (Signed)
Adult Psychoeducational Group Note  Date:  10/29/2022 Time:  1:51 PM  Group Topic/Focus:  Goals Group:   The focus of this group is to help patients establish daily goals to achieve during treatment and discuss how the patient can incorporate goal setting into their daily lives to aide in recovery.  Participation Level:  Did Not Attend  Participation Quality:    Affect:    Cognitive:    Insight:   Engagement in Group:    Modes of Intervention:    Additional Comments:    Sheran Lawless 10/29/2022, 1:51 PM

## 2022-10-29 NOTE — Progress Notes (Signed)
[facility] MD Progress Note  10/29/2022 1:13 PM Jaclyn Owens  MRN:  [id number] Subjective:  Patient is a 27 year old female with a past psychiatric history of bipolar disorder who was admitted to the psychiatric hospital from [facility] after being taken there from jail for psychosis, mood lability, hostility and verbal combativeness.  Patient was apparently IVC at the jail.   Per nursing - pt is walking around without shirt, slept 3.5 hours last night, c/o constipation   On my assessment today, the pt states she is ready to discharge so taht she cn go pickup her 3 kids. When confronted her that kids are in cps custody with foster family, the pt responded anything that comes out of me is mine and I'm going go pick them up when I get out of here.  Her mood is irritable. Sleep is poor. Anxiety level is elevated. Concentration is poor. Denying si, hi. Denying ah, vh. Not reporting any paranoia or delusional statements.  Denies s/e to current meds.    Pt gave consent for this writer to contact her grandmother, with whom she has been speaking on the phone with, Kevan Rosebush [phone] - I called, no answer, no VM available   Principal Problem: Severe manic bipolar 1 disorder with psychotic behavior (HCC) Diagnosis: Principal Problem:   Severe manic bipolar 1 disorder with psychotic behavior (HCC)  Total Time spent with patient: 15 minutes  Past Psychiatric History:  Patient reports previous psychiatric diagnosis of bipolar disorder. Reports 3 psychiatric hospitalizations in the past. Reports a history of overdose in 2018 as suicide attempt. No current psychiatric medications. Patient reports psychiatric medication history is significant for: Seroquel, Zyprexa, trazodone, hydroxyzine.    Past Medical History:  Past Medical History:  Diagnosis Date   Asthma    HSV (herpes simplex virus) infection     Past Surgical History:  Procedure Laterality Date   ARM WOUND REPAIR / CLOSURE     Family  History:  Family History  Problem Relation Age of Onset   Asthma Mother    Diabetes Maternal Aunt    Cancer Maternal Grandmother    Family Psychiatric  History:  Patient denies any significant family history of psychiatric illness or known family history of suicide attempts.   Social History:  Social History   Substance and Sexual Activity  Alcohol Use Not Currently   Comment: UTA, pt is not cooperative with triage questions 06/16/22     Social History   Substance and Sexual Activity  Drug Use Yes   Types: Marijuana    Social History   Socioeconomic History   Marital status: Single    Spouse name: Not on file   Number of children: 1   Years of education: 13   Highest education level: High school graduate  Occupational History   Not on file  Tobacco Use   Smoking status: Former    Types: Cigarettes, Cigars   Smokeless tobacco: Never  Vaping Use   Vaping status: Former  Substance and Sexual Activity   Alcohol use: Not Currently    Comment: UTA, pt is not cooperative with triage questions 06/16/22   Drug use: Yes    Types: Marijuana   Sexual activity: Yes    Birth control/protection: None  Other Topics Concern   Not on file  Social History Narrative   Not on file   Social Determinants of Health   Financial Resource Strain: Low Risk  (01/10/2018)   Overall Financial Resource Strain (CARDIA)  Difficulty of Paying Living Expenses: Not very hard  Food Insecurity: No Food Insecurity (10/25/2022)   Hunger Vital Sign    Worried About Running Out of Food in the Last Year: Never true    Ran Out of Food in the Last Year: Never true  Transportation Needs: No Transportation Needs (10/25/2022)   PRAPARE - Administrator, Civil Service (Medical): No    Lack of Transportation (Non-Medical): No  Physical Activity: Inactive (01/10/2018)   Exercise Vital Sign    Days of Exercise per Week: 0 days    Minutes of Exercise per Session: 0 min  Stress: No Stress  Concern Present (01/10/2018)   [employer] of Occupational Health - Occupational Stress Questionnaire    Feeling of Stress : Not at all  Social Connections: Somewhat Isolated (01/10/2018)   Social Connection and Isolation Panel [NHANES]    Frequency of Communication with Friends and Family: More than three times a week    Frequency of Social Gatherings with Friends and Family: More than three times a week    Attends Religious Services: More than 4 times per year    Active Member of [employer] or Organizations: No    Attends Engineer, structural: Never    Marital Status: Never married   Additional Social History:                          Current Medications: Current Facility-Administered Medications  Medication Dose Route Frequency Provider Last Rate Last Admin   acetaminophen (TYLENOL) tablet 650 mg  650 mg Oral Q6H PRN Motley-Mangrum, Jadeka A, PMHNP       alum & mag hydroxide-simeth (MAALOX/MYLANTA) 200-200-20 MG/5ML suspension 30 mL  30 mL Oral Q4H PRN Motley-Mangrum, Jadeka A, PMHNP       diphenhydrAMINE (BENADRYL) capsule 50 mg  50 mg Oral TID PRN Motley-Mangrum, Jadeka A, PMHNP       Or   diphenhydrAMINE (BENADRYL) injection 50 mg  50 mg Intramuscular TID PRN Motley-Mangrum, Jadeka A, PMHNP       feeding supplement (ENSURE ENLIVE / ENSURE PLUS) liquid 237 mL  237 mL Oral BID BM Madine Sarr, Harrold Donath, MD   237 mL at 10/28/22 1618   haloperidol (HALDOL) tablet 5 mg  5 mg Oral TID PRN Motley-Mangrum, Jadeka A, PMHNP       Or   haloperidol lactate (HALDOL) injection 5 mg  5 mg Intramuscular TID PRN Motley-Mangrum, Jadeka A, PMHNP       hydrOXYzine (ATARAX) tablet 25 mg  25 mg Oral TID PRN Motley-Mangrum, Jadeka A, PMHNP       LORazepam (ATIVAN) tablet 2 mg  2 mg Oral TID PRN Motley-Mangrum, Jadeka A, PMHNP       Or   LORazepam (ATIVAN) injection 2 mg  2 mg Intramuscular TID PRN Motley-Mangrum, Jadeka A, PMHNP       magnesium hydroxide (MILK OF MAGNESIA) suspension  30 mL  30 mL Oral Daily PRN Motley-Mangrum, Jadeka A, PMHNP   30 mL at 10/28/22 2048   multivitamin with minerals tablet 1 tablet  1 tablet Oral Daily Wylan Gentzler, MD       OLANZapine zydis (ZYPREXA) disintegrating tablet 20 mg  20 mg Oral QHS McQuilla, Gerlean Ren B, MD   20 mg at 10/28/22 2048   QUEtiapine (SEROQUEL XR) 24 hr tablet 100 mg  100 mg Oral QHS Eliabeth Shoff, MD       traZODone (DESYREL) tablet 100 mg  100 mg Oral QHS PRN Motley-Mangrum, Jadeka A, PMHNP   100 mg at 10/28/22 2048    Lab Results: No results found for this or any previous visit (from the past 48 hour(s)).  Blood Alcohol level:  Lab Results  Component Value Date   ETH <10 10/24/2022   ETH <10 06/16/2022    Metabolic Disorder Labs: Lab Results  Component Value Date   HGBA1C 5.7 (H) 06/27/2022   MPG 117 06/27/2022   No results found for: "PROLACTIN" Lab Results  Component Value Date   CHOL 124 06/27/2022   TRIG 40 06/27/2022   HDL 52 06/27/2022   CHOLHDL 2.4 06/27/2022   VLDL 8 06/27/2022   LDLCALC 64 06/27/2022    Physical Findings: AIMS:  , ,  ,  ,    CIWA:    COWS:     Musculoskeletal: Strength & Muscle Tone: within normal limits Gait & Station: normal Patient leans: N/A  Psychiatric Specialty Exam:  Presentation  General Appearance:  -- (scantilly clad)  Eye Contact: Fleeting  Speech: Normal Rate  Speech Volume: Increased  Handedness:No data recorded  Mood and Affect  Mood: Anxious; Irritable  Affect: Full Range   Thought Process  Thought Processes: Linear  Descriptions of Associations:Intact  Orientation:Full (Time, Place and Person)  Thought Content:Logical  History of Schizophrenia/Schizoaffective disorder:No  Duration of Psychotic Symptoms:Less than six months  Hallucinations:Hallucinations: None  Ideas of Reference:None  Suicidal Thoughts:Suicidal Thoughts: No  Homicidal Thoughts:Homicidal Thoughts: No   Sensorium  Memory: Immediate  Fair; Recent Fair; Remote Fair  Judgment: Impaired  Insight: Lacking   Executive Functions  Concentration: Poor  Attention Span: Poor  Recall: [employer] of Knowledge: Fair  Language: Fair   Psychomotor Activity  Psychomotor Activity: Psychomotor Activity: Restlessness   Assets  Assets: Social Support; Resilience   Sleep  Sleep: Sleep: Poor Number of Hours of Sleep: 5.1    Physical Exam: Physical Exam Constitutional:      Appearance: Normal appearance.  HENT:     Head: Normocephalic and atraumatic.  Pulmonary:     Effort: Pulmonary effort is normal.  Neurological:     Mental Status: She is alert and oriented to person, place, and time.    Review of Systems  Psychiatric/Behavioral:  Negative for hallucinations and suicidal ideas.    Blood pressure 99/66, pulse 97, temperature 98.1 F (36.7 C), temperature source Oral, resp. rate 17, height 5\' 6"  (1.676 m), weight 59.4 kg, SpO2 100%. Body mass index is 21.14 kg/m.   Treatment Plan Summary: Daily contact with patient to assess and evaluate symptoms and progress in treatment and Medication management  ASSESSMENT:     Diagnoses / Active Problems: Bipolar disorder, type I, current episode is manic   PLAN: Safety and Monitoring:             --  Involuntary admission to inpatient psychiatric unit for safety, stabilization and treatment             -- Daily contact with patient to assess and evaluate symptoms and progress in treatment             -- Patient's case to be discussed in multi-disciplinary team meeting             -- Observation Level : q15 minute checks             -- Vital signs:  q12 hours             -- Precautions: suicide,  elopement, and assault   2. Psychiatric Diagnoses and Treatment:               - Continue  Zyprexa to 20mg  qhs -Restart seroquel XR 100 mg at bedtime (previously on this med during last admission) as zyprexa at max dose is not effective enough for mood  instability, irritability, and insomnia.   --  The risks/benefits/side-effects/alternatives to this medication were discussed in detail with the patient and time was given for questions. The patient consents to medication trial.                -- Metabolic profile and EKG monitoring obtained while on an atypical antipsychotic (BMI: Lipid Panel: HbgA1c: QTc:)              -- Encouraged patient to participate in unit milieu and in scheduled group therapies   3. Medical Issues Being Addressed:                   4. Discharge Planning:              -- Social work and case management to assist with discharge planning and identification of hospital follow-up needs prior to discharge             -- Estimated LOS: 5-7 days             -- Discharge Concerns: Need to establish a safety plan; Medication compliance and effectiveness             -- Discharge Goals: Return home with outpatient referrals for mental health follow-up including medication management/psychotherapy        Cristy Hilts, MD 10/29/2022, 1:13 PM  Total Time Spent in Direct Patient Care:  I personally spent 35 minutes on the unit in direct patient care. The direct patient care time included face-to-face time with the patient, reviewing the patient's chart, communicating with other professionals, and coordinating care. Greater than 50% of this time was spent in counseling or coordinating care with the patient regarding goals of hospitalization, psycho-education, and discharge planning needs.   Phineas Inches, MD Psychiatrist

## 2022-10-29 NOTE — Progress Notes (Signed)
   10/29/22 2130  Psych Admission Type (Psych Patients Only)  Admission Status Involuntary  Psychosocial Assessment  Patient Complaints Suspiciousness  Eye Contact Fair  Facial Expression Flat;Anxious;Worried  Affect Preoccupied  Speech Logical/coherent  Interaction Cautious;Forwards little  Motor Activity Slow;Restless  Appearance/Hygiene Unremarkable  Behavior Characteristics Cooperative  Mood Preoccupied;Suspicious  Aggressive Behavior  Effect No apparent injury  Thought Process  Coherency Circumstantial;Tangential  Content Blaming others;Preoccupation  Delusions None reported or observed  Perception WDL  Hallucination None reported or observed  Judgment Poor  Confusion WDL  Danger to Self  Current suicidal ideation? Denies

## 2022-10-29 NOTE — Plan of Care (Signed)
  Problem: Coping: Goal: Ability to verbalize frustrations and anger appropriately will improve Outcome: Progressing Goal: Ability to demonstrate self-control will improve Outcome: Progressing   

## 2022-10-29 NOTE — Plan of Care (Signed)
  Problem: Nutritional: Goal: Ability to achieve adequate nutritional intake will improve Outcome: Progressing   Problem: Activity: Goal: Interest or engagement in activities will improve Outcome: Progressing   Problem: Safety: Goal: Ability to redirect hostility and anger into socially appropriate behaviors will improve Outcome: Progressing

## 2022-10-29 NOTE — Group Note (Signed)
LCSW Group Therapy Note  Group Date: 10/29/2022 Start Time: 1300 End Time: 1400   Type of Therapy and Topic:  Group Therapy - How To Cope with Nervousness about Discharge   Participation Level:  Did Not Attend   Description of Group This process group involved identification of patients' feelings about discharge. Some of them are scheduled to be discharged soon, while others are new admissions, but each of them was asked to share thoughts and feelings surrounding discharge from the hospital. One common theme was that they are excited at the prospect of going home, while another was that many of them are apprehensive about sharing why they were hospitalized. Patients were given the opportunity to discuss these feelings with their peers in preparation for discharge.  Therapeutic Goals  Patient will identify their overall feelings about pending discharge. Patient will think about how they might proactively address issues that they believe will once again arise once they get home (i.e. with parents). Patients will participate in discussion about having hope for change.   Summary of Patient Progress:  Did Not Attend   Therapeutic Modalities Cognitive Behavioral Therapy   Isabella Bowens, LCSWA 10/29/2022  2:12 PM

## 2022-10-29 NOTE — Group Note (Signed)
Date:  10/29/2022 Time:  9:33 PM  Group Topic/Focus:  Recovery Goals:   The focus of this group is to identify appropriate goals for recovery and establish a plan to achieve them.    Participation Level:  Did Not Attend  Participation Quality:    Affect:    Cognitive:    Insight:   Engagement in Group:    Modes of Intervention:    Additional Comments:    Shawniece Oyola 10/29/2022, 9:33 PM

## 2022-10-29 NOTE — [facility] Group Notes (Signed)
Adult Psychoeducational Group Note  Date:  10/29/2022 Time:  8:50 PM  Group Topic/Focus:  Wrap-Up Group:   The focus of this group is to help patients review their daily goal of treatment and discuss progress on daily workbooks.  Participation Level:  Active  Participation Quality:  Appropriate  Affect:  Appropriate  Cognitive:  Appropriate  Insight: Appropriate  Engagement in Group:  Engaged  Modes of Intervention:  Discussion  Additional Comments:  Pt state that she had a good day. Pt attended group and exercised today. Pt states she [place] ready to be d/c so she can take care of her babies but is wanting to fulfill the program. Pt denied everything   Vevelyn Pat 10/29/2022, 8:50 PM

## 2022-10-29 NOTE — Group Note (Addendum)
Recreation Therapy Group Note   Group Topic:Coping Skills  Group Date: 10/29/2022 Start Time: 1020 End Time: 1050 Facilitators: Yusef Lamp-McCall, LRT,CTRS Location: 500 Hall Dayroom   Group Topic: Coping Skills   Goal Area(s) Addresses:  Patient will successfully define what a coping skill is. Patient will acknowledge current strategies used in terms of healthy vs unhealthy. Patient will successfully identify benefit of using outlined coping skills post d/c.   Group Description: Mind Map. LRT and patients discussed coping skills and their importance. Patients then given a blank mind map. LRT and patients filled in the first 8 boxes together (depression, anxiety, tolerance, hopelessness, stress, poor nutrition, anger and embarrassment). Patients were to then identify at least 3 coping skills for each situation identified. LRT gave patients 15 minutes before reconvening the group to fill in their answers on the board.    Education: Pharmacologist, Scientist, physiological, Discharge Planning.    Education Outcome: Acknowledges education/In group clarification offered/Needs additional education.    Affect/Mood: Appropriate   Participation Level: Moderate   Participation Quality: Independent   Behavior: Appropriate   Speech/Thought Process: Relevant   Insight: Moderate   Judgement: Moderate   Modes of Intervention: Worksheet   Patient Response to Interventions:  Attentive   Education Outcome:  In group clarification offered    Clinical Observations/Individualized Feedback: Pt was attentive and participated. Pt identified her biggest stress as "being in this hospital is my stress". When asked how she deals with being in the hospital, pt stated her medicine has helped with that. Pt went to share that she wanted to be on a particular medication and knowing she needs to take her medication when she leaves the hospital. Pt also identified having a therapist and not eating pork are  coping skills as well.      Plan: Continue to engage patient in RT group sessions 2-3x/week.   Nigil Braman-McCall, LRT,CTRS 10/29/2022 1:11 PM

## 2022-10-29 NOTE — Progress Notes (Signed)
   10/29/22 0518  15 Minute Checks  Location Bedroom  Visual Appearance Calm  Behavior Composed  Sleep (Behavioral Health Patients Only)  Calculate sleep? (Click Yes once per 24 hr at 0600 safety check) Yes  Documented sleep last 24 hours 3.5

## 2022-10-30 DIAGNOSIS — F312 Bipolar disorder, current episode manic severe with psychotic features: Secondary | ICD-10-CM | POA: Diagnosis not present

## 2022-10-30 NOTE — Plan of Care (Signed)
  Problem: Education: Goal: Knowledge of [facility] Education information/materials will improve 10/30/2022 1922 by Shan Levans, RN Outcome: Progressing 10/30/2022 1920 by Shan Levans, RN Outcome: Progressing

## 2022-10-30 NOTE — Progress Notes (Signed)
Pt stated she wanted to get D/C on 10/2 due to her court date on 10/4 and having to get things done before her court date. Pt encouraged to talk to the doctor , pt wrote a note that was copied and placed in her chart and the not passed on to day shift RN    Oct 31, 2022 2230  Psych Admission Type (Psych Patients Only)  Admission Status Involuntary  Psychosocial Assessment  Patient Complaints Suspiciousness  Eye Contact Fair  Facial Expression Flat;Anxious;Worried  Affect Preoccupied  Speech Logical/coherent  Interaction Cautious;Forwards little  Motor Activity Slow;Restless  Appearance/Hygiene Unremarkable  Behavior Characteristics Cooperative  Mood Suspicious;Pleasant  Aggressive Behavior  Effect No apparent injury  Thought Process  Coherency Circumstantial;Tangential  Content Blaming others;Preoccupation  Delusions None reported or observed  Perception WDL  Hallucination None reported or observed  Judgment Poor  Confusion WDL  Danger to Self  Current suicidal ideation? Denies

## 2022-10-30 NOTE — Plan of Care (Signed)
°  Problem: Education: Goal: Emotional status will improve Outcome: Progressing Goal: Mental status will improve Outcome: Progressing Goal: Verbalization of understanding the information provided will improve Outcome: Progressing   Problem: Activity: Goal: Interest or engagement in activities will improve Outcome: Progressing Goal: Sleeping patterns will improve Outcome: Progressing   Problem: Coping: Goal: Ability to demonstrate self-control will improve Outcome: Progressing   Problem: Safety: Goal: Periods of time without injury will increase Outcome: Progressing

## 2022-10-30 NOTE — [facility] Group Notes (Signed)
Pt did not attend group discussion 

## 2022-10-30 NOTE — Progress Notes (Signed)
Ambulating in hallway.Appropriate, guarded, calm and cooperative Denies SI/HI/AVH.Vital signs stable with no complaints currently

## 2022-10-30 NOTE — Plan of Care (Signed)
Emotion status is improving

## 2022-10-30 NOTE — Plan of Care (Signed)
Emotional status is improving

## 2022-10-30 NOTE — Group Note (Signed)
Recreation Therapy Group Note   Group Topic:Relaxation  Group Date: 10/30/2022 Start Time: 1040 End Time: 1110 Facilitators: Arath Kaigler-McCall, LRT,CTRS Location: 500 [address] Dayroom   Group Topic: Stress Management  Goal Area(s) Addresses:  Patient will identify positive benefits of music. Patient will identify benefits of using music as a means to relax.  Group Description: LRT and patients discussed the importance of music and how it can affect your mood. LRT allowed patients to pick songs that interested them. As long as the songs were clean and appropriate, LRT played the songs for patients.    Education:  Stress Management, Discharge Planning.   Education Outcome: Acknowledges Education   Affect/Mood: N/A   Participation Level: Did not attend    Clinical Observations/Individualized Feedback:     Plan: Continue to engage patient in RT group sessions 2-3x/week.   Mamta Rimmer-McCall, LRT,CTRS 10/30/2022 12:11 PM

## 2022-10-30 NOTE — Progress Notes (Signed)
[facility] MD Progress Note  10/30/2022 2:28 PM Jaclyn Owens  MRN:  [id number] Subjective:  Patient is a 27 year old female with a past psychiatric history of bipolar disorder who was admitted to the psychiatric hospital from [facility] after being taken there from jail for psychosis, mood lability, hostility and verbal combativeness.  Patient was apparently IVC at the jail.   Per nursing - pt slept better last night, 8.25 hours (up from 3.5 hours)  On my assessment today, the pt states she is still frustrated but unsure why, states she threw cereal against wall this morning (she did).  She reports sleep is better, appetite is better. Concentration is better today.  Denying any SI or HI.  Reports she needs to go to a hearing about her children's custody -per nurse who spoke with pt';s grandmother, this is true, hearing is this Friday.  She is not obviously psychotic or hallucinating.   Denies s/e to current meds.   Discussed dc planning for Thursday 11-01-22, as this is the first day of psychiatric improvement on my examinations.    Collateral - 10-29-2022: Pt gave consent for this writer to contact her grandmother, with whom she has been speaking on the phone with, Kevan Rosebush [phone] - I called, no answer, no VM available     Principal Problem: Severe manic bipolar 1 disorder with psychotic behavior (HCC) Diagnosis: Principal Problem:   Severe manic bipolar 1 disorder with psychotic behavior (HCC)  Total Time spent with patient: 15 minutes  Past Psychiatric History:  Patient reports previous psychiatric diagnosis of bipolar disorder. Reports 3 psychiatric hospitalizations in the past. Reports a history of overdose in 2018 as suicide attempt. No current psychiatric medications. Patient reports psychiatric medication history is significant for: Seroquel, Zyprexa, trazodone, hydroxyzine.    Past Medical History:  Past Medical History:  Diagnosis Date   Asthma    HSV (herpes  simplex virus) infection     Past Surgical History:  Procedure Laterality Date   ARM WOUND REPAIR / CLOSURE     Family History:  Family History  Problem Relation Age of Onset   Asthma Mother    Diabetes Maternal Aunt    Cancer Maternal Grandmother    Family Psychiatric  History:  Patient denies any significant family history of psychiatric illness or known family history of suicide attempts.   Social History:  Social History   Substance and Sexual Activity  Alcohol Use Not Currently   Comment: UTA, pt is not cooperative with triage questions 06/16/22     Social History   Substance and Sexual Activity  Drug Use Yes   Types: Marijuana    Social History   Socioeconomic History   Marital status: Single    Spouse name: Not on file   Number of children: 1   Years of education: 13   Highest education level: High school graduate  Occupational History   Not on file  Tobacco Use   Smoking status: Former    Types: Cigarettes, Cigars   Smokeless tobacco: Never  Vaping Use   Vaping status: Former  Substance and Sexual Activity   Alcohol use: Not Currently    Comment: UTA, pt is not cooperative with triage questions 06/16/22   Drug use: Yes    Types: Marijuana   Sexual activity: Yes    Birth control/protection: None  Other Topics Concern   Not on file  Social History Narrative   Not on file   Social Determinants of Health   Financial  Resource Strain: Low Risk  (01/10/2018)   Overall Financial Resource Strain (CARDIA)    Difficulty of Paying Living Expenses: Not very hard  Food Insecurity: No Food Insecurity (10/25/2022)   Hunger Vital Sign    Worried About Running Out of Food in the Last Year: Never true    Ran Out of Food in the Last Year: Never true  Transportation Needs: No Transportation Needs (10/25/2022)   PRAPARE - Administrator, Civil Service (Medical): No    Lack of Transportation (Non-Medical): No  Physical Activity: Inactive (01/10/2018)    Exercise Vital Sign    Days of Exercise per Week: 0 days    Minutes of Exercise per Session: 0 min  Stress: No Stress Concern Present (01/10/2018)   [employer] of Occupational Health - Occupational Stress Questionnaire    Feeling of Stress : Not at all  Social Connections: Somewhat Isolated (01/10/2018)   Social Connection and Isolation Panel [NHANES]    Frequency of Communication with Friends and Family: More than three times a week    Frequency of Social Gatherings with Friends and Family: More than three times a week    Attends Religious Services: More than 4 times per year    Active Member of [employer] or Organizations: No    Attends Engineer, structural: Never    Marital Status: Never married   Additional Social History:                          Current Medications: Current Facility-Administered Medications  Medication Dose Route Frequency Provider Last Rate Last Admin   acetaminophen (TYLENOL) tablet 650 mg  650 mg Oral Q6H PRN Motley-Mangrum, Jadeka A, PMHNP       alum & mag hydroxide-simeth (MAALOX/MYLANTA) 200-200-20 MG/5ML suspension 30 mL  30 mL Oral Q4H PRN Motley-Mangrum, Jadeka A, PMHNP       diphenhydrAMINE (BENADRYL) capsule 50 mg  50 mg Oral TID PRN Motley-Mangrum, Jadeka A, PMHNP       Or   diphenhydrAMINE (BENADRYL) injection 50 mg  50 mg Intramuscular TID PRN Motley-Mangrum, Jadeka A, PMHNP       feeding supplement (ENSURE ENLIVE / ENSURE PLUS) liquid 237 mL  237 mL Oral BID BM Wladyslaw Henrichs, Harrold Donath, MD   237 mL at 10/30/22 1021   haloperidol (HALDOL) tablet 5 mg  5 mg Oral TID PRN Motley-Mangrum, Jadeka A, PMHNP       Or   haloperidol lactate (HALDOL) injection 5 mg  5 mg Intramuscular TID PRN Motley-Mangrum, Geralynn Ochs A, PMHNP       hydrOXYzine (ATARAX) tablet 25 mg  25 mg Oral TID PRN Motley-Mangrum, Jadeka A, PMHNP   25 mg at 10/30/22 1250   LORazepam (ATIVAN) tablet 2 mg  2 mg Oral TID PRN Motley-Mangrum, Jadeka A, PMHNP       Or   LORazepam  (ATIVAN) injection 2 mg  2 mg Intramuscular TID PRN Motley-Mangrum, Jadeka A, PMHNP       magnesium hydroxide (MILK OF MAGNESIA) suspension 30 mL  30 mL Oral Daily PRN Motley-Mangrum, Jadeka A, PMHNP   30 mL at 10/28/22 2048   multivitamin with minerals tablet 1 tablet  1 tablet Oral Daily Alece Koppel, MD   1 tablet at 10/30/22 0758   OLANZapine zydis (ZYPREXA) disintegrating tablet 20 mg  20 mg Oral QHS Eliseo Gum B, MD   20 mg at 10/29/22 2041   QUEtiapine (SEROQUEL XR) 24 hr tablet  100 mg  100 mg Oral QHS Cassadie Pankonin, MD   100 mg at 10/29/22 2041   traZODone (DESYREL) tablet 100 mg  100 mg Oral QHS PRN Motley-Mangrum, Jadeka A, PMHNP   100 mg at 10/29/22 2042    Lab Results: No results found for this or any previous visit (from the past 48 hour(s)).  Blood Alcohol level:  Lab Results  Component Value Date   ETH <10 10/24/2022   ETH <10 06/16/2022    Metabolic Disorder Labs: Lab Results  Component Value Date   HGBA1C 5.7 (H) 06/27/2022   MPG 117 06/27/2022   No results found for: "PROLACTIN" Lab Results  Component Value Date   CHOL 124 06/27/2022   TRIG 40 06/27/2022   HDL 52 06/27/2022   CHOLHDL 2.4 06/27/2022   VLDL 8 06/27/2022   LDLCALC 64 06/27/2022    Physical Findings: AIMS:  , ,  ,  ,    CIWA:    COWS:     Musculoskeletal: Strength & Muscle Tone: within normal limits Gait & Station: normal Patient leans: N/A  Psychiatric Specialty Exam:  Presentation  General Appearance:  -- (scantilly clad)  Eye Contact: Fleeting  Speech: Normal Rate  Speech Volume: Increased  Handedness:No data recorded  Mood and Affect  Mood: Anxious; Irritable  Affect: Full Range   Thought Process  Thought Processes: Linear  Descriptions of Associations:Intact  Orientation:Full (Time, Place and Person)  Thought Content:Logical  History of Schizophrenia/Schizoaffective disorder:No  Duration of Psychotic Symptoms:Less than six  months  Hallucinations:Hallucinations: None  Ideas of Reference:None  Suicidal Thoughts:Suicidal Thoughts: No  Homicidal Thoughts:Homicidal Thoughts: No   Sensorium  Memory: Immediate Fair; Recent Fair; Remote Fair  Judgment: Impaired  Insight: Lacking   Executive Functions  Concentration: Poor  Attention Span: Poor  Recall: [employer] of Knowledge: Fair  Language: Fair   Psychomotor Activity  Psychomotor Activity: Psychomotor Activity: Restlessness   Assets  Assets: Social Support; Resilience   Sleep  Sleep: Sleep: Poor    Physical Exam: Physical Exam Constitutional:      Appearance: Normal appearance.  HENT:     Head: Normocephalic and atraumatic.  Pulmonary:     Effort: Pulmonary effort is normal.  Neurological:     Mental Status: She is alert and oriented to person, place, and time.    Review of Systems  Psychiatric/Behavioral:  Negative for hallucinations and suicidal ideas.    Blood pressure 101/68, pulse (!) 110, temperature 97.9 F (36.6 C), temperature source Oral, resp. rate 17, height 5\' 6"  (1.676 m), weight 59.4 kg, SpO2 100%. Body mass index is 21.14 kg/m.   Treatment Plan Summary: Daily contact with patient to assess and evaluate symptoms and progress in treatment and Medication management  ASSESSMENT:     Diagnoses / Active Problems: Bipolar disorder, type I, current episode is manic   PLAN: Safety and Monitoring:             --  Involuntary admission to inpatient psychiatric unit for safety, stabilization and treatment             -- Daily contact with patient to assess and evaluate symptoms and progress in treatment             -- Patient's case to be discussed in multi-disciplinary team meeting             -- Observation Level : q15 minute checks             --  Vital signs:  q12 hours             -- Precautions: suicide, elopement, and assault   2. Psychiatric Diagnoses and Treatment:               -  Continue  Zyprexa to 20mg  qhs - Continue seroquel XR 100 mg at bedtime (previously on this med during last admission) as zyprexa at max dose has not been effective enough for mood instability, irritability, and insomnia.   --  The risks/benefits/side-effects/alternatives to this medication were discussed in detail with the patient and time was given for questions. The patient consents to medication trial.                -- Metabolic profile and EKG monitoring obtained while on an atypical antipsychotic (BMI: Lipid Panel: HbgA1c: QTc:)              -- Encouraged patient to participate in unit milieu and in scheduled group therapies   3. Medical Issues Being Addressed:                   4. Discharge Planning:              -- Social work and case management to assist with discharge planning and identification of hospital follow-up needs prior to discharge             -- Estimated LOS: 2-3 more days             -- Discharge Concerns: Need to establish a safety plan; Medication compliance and effectiveness             -- Discharge Goals: Return home with outpatient referrals for mental health follow-up including medication management/psychotherapy        Cristy Hilts, MD 10/30/2022, 2:28 PM  Total Time Spent in Direct Patient Care:  I personally spent 25 minutes on the unit in direct patient care. The direct patient care time included face-to-face time with the patient, reviewing the patient's chart, communicating with other professionals, and coordinating care. Greater than 50% of this time was spent in counseling or coordinating care with the patient regarding goals of hospitalization, psycho-education, and discharge planning needs.    Phineas Inches, MD Psychiatrist

## 2022-10-31 DIAGNOSIS — F312 Bipolar disorder, current episode manic severe with psychotic features: Secondary | ICD-10-CM | POA: Diagnosis not present

## 2022-10-31 MED ORDER — QUETIAPINE FUMARATE ER 50 MG PO TB24: 100.0000 mg | ORAL_TABLET | Freq: Every day | ORAL | 0 refills | Status: DC

## 2022-10-31 MED ORDER — HYDROXYZINE HCL 25 MG PO TABS: 25.0000 mg | ORAL_TABLET | Freq: Three times a day (TID) | ORAL | 0 refills | Status: DC | PRN

## 2022-10-31 MED ORDER — OLANZAPINE 10 MG PO TABS
20.0000 mg | ORAL_TABLET | Freq: Every day | ORAL | Status: DC
  Filled 2022-10-31: qty 2

## 2022-10-31 MED ORDER — TRAZODONE HCL 100 MG PO TABS: 100.0000 mg | ORAL_TABLET | Freq: Every evening | ORAL | 0 refills | Status: DC | PRN

## 2022-10-31 MED ORDER — OLANZAPINE 20 MG PO TABS: 20.0000 mg | ORAL_TABLET | Freq: Every day | ORAL | 0 refills | Status: DC

## 2022-10-31 NOTE — [facility] Suicide Risk Assessment (Signed)
[facility] INPATIENT:  Family/Significant Other Suicide Prevention Education  Suicide Prevention Education:  Family/Significant Other Refusal to Support Patient after Discharge:  Suicide Prevention Education Not Provided:  Patient has identified home of family/significant other as the place the patient will be residing after discharge.  With written consent of the patient, two attempts were made to provide Suicide Prevention Education to [name], (name of family member/significant other).  This person indicates she will not be responsible for the patient after discharge.  [name]  10/31/2022,2:17 PM

## 2022-10-31 NOTE — Discharge Summary (Signed)
Physician Discharge Summary Note  Patient:  Jaclyn Owens is an 27 y.o., female MRN:  [id number] DOB:  [birth year] Patient phone:  [phone] (home)  Patient address:   [address] [place] [address],  Total Time spent with patient: 20 minutes  Date of Admission:  10/25/2022 Date of Discharge: 10-31-2022  Reason for Admission:    Patient is a 27 year old female with a past psychiatric history of bipolar disorder who was admitted to the psychiatric hospital from [facility] after being taken there from jail for psychosis, mood lability, hostility and verbal combativeness. Patient was apparently IVC at the jail.   Principal Problem: Severe manic bipolar 1 disorder with psychotic behavior [facility]) Discharge Diagnoses: Principal Problem:   Severe manic bipolar 1 disorder with psychotic behavior [facility])   Past Psychiatric History:  Patient reports previous psychiatric diagnosis of bipolar disorder. Reports 3 psychiatric hospitalizations in the past. Reports a history of overdose in 2018 as suicide attempt. No current psychiatric medications. Patient reports psychiatric medication history is significant for: Seroquel, Zyprexa, trazodone, hydroxyzine.    Past Medical History:  Past Medical History:  Diagnosis Date   Asthma    HSV (herpes simplex virus) infection     Past Surgical History:  Procedure Laterality Date   ARM WOUND REPAIR / CLOSURE     Family History:  Family History  Problem Relation Age of Onset   Asthma Mother    Diabetes Maternal Aunt    Cancer Maternal Grandmother    Family Psychiatric  History: See H&P   Social History:  Social History   Substance and Sexual Activity  Alcohol Use Not Currently   Comment: UTA, pt is not cooperative with triage questions 06/16/22     Social History   Substance and Sexual Activity  Drug Use Yes   Types: Marijuana    Social History   Socioeconomic History   Marital status: Single    Spouse name:  Not on file   Number of children: 1   Years of education: 13   Highest education level: High school graduate  Occupational History   Not on file  Tobacco Use   Smoking status: Former    Types: Cigarettes, Cigars   Smokeless tobacco: Never  Vaping Use   Vaping status: Former  Substance and Sexual Activity   Alcohol use: Not Currently    Comment: UTA, pt is not cooperative with triage questions 06/16/22   Drug use: Yes    Types: Marijuana   Sexual activity: Yes    Birth control/protection: None  Other Topics Concern   Not on file  Social History Narrative   Not on file   Social Determinants of Health   Financial Resource Strain: Low Risk  (01/10/2018)   Overall Financial Resource Strain (CARDIA)    Difficulty of Paying Living Expenses: Not very hard  Food Insecurity: No Food Insecurity (10/25/2022)   Hunger Vital Sign    Worried About Running Out of Food in the Last Year: Never true    Ran Out of Food in the Last Year: Never true  Transportation Needs: No Transportation Needs (10/25/2022)   PRAPARE - Administrator, Civil Service (Medical): No    Lack of Transportation (Non-Medical): No  Physical Activity: Inactive (01/10/2018)   Exercise Vital Sign    Days of Exercise per Week: 0 days    Minutes of Exercise per Session: 0 min  Stress: No Stress Concern Present (01/10/2018)   [employer] of Occupational  Health - Occupational Stress Questionnaire    Feeling of Stress : Not at all  Social Connections: Somewhat Isolated (01/10/2018)   Social Connection and Isolation Panel [NHANES]    Frequency of Communication with Friends and Family: More than three times a week    Frequency of Social Gatherings with Friends and Family: More than three times a week    Attends Religious Services: More than 4 times per year    Active Member of [employer] or Organizations: No    Attends Banker Meetings: Never    Marital Status: Never married    Hospital Course:    During the patient's hospitalization, patient had extensive initial psychiatric evaluation, and follow-up psychiatric evaluations every day.  Psychiatric diagnoses provided upon initial assessment:  Bipolar disorder, type I, current episode is manic   Patient's psychiatric medications were adjusted on admission:  -Stop Seroquel that was started by admitting provider.  Patient reports Zyprexa works better for her -Change Zyprexa from 5 mg twice daily to 10 mg nightly.    During the hospitalization, other adjustments were made to the patient's psychiatric medication regimen:  -Zyprexa increased to 20 mg at bedtime  -Seroquel was started at 100 mg at bedtime   Patient's care was discussed during the interdisciplinary team meeting every day during the hospitalization.  The patient denied having side effects to prescribed psychiatric medication.  Gradually, patient started adjusting to milieu. The patient was evaluated each day by a clinical provider to ascertain response to treatment. Improvement was noted by the patient's report of decreasing symptoms, improved sleep and appetite, affect, medication tolerance, behavior, and participation in unit programming.  Patient was asked each day to complete a self inventory noting mood, mental status, pain, new symptoms, anxiety and concerns.    Symptoms were reported as significantly decreased or resolved completely by discharge.   On day of discharge, the patient reports that their mood is stable. The patient denied having suicidal thoughts for more than 48 hours prior to discharge.  Patient denies having homicidal thoughts.  Patient denies having auditory hallucinations.  Patient denies any visual hallucinations or other symptoms of psychosis. The patient was motivated to continue taking medication with a goal of continued improvement in mental health.   The patient reports their target psychiatric symptoms of psychosis, mood lability, hostility and  verbal combativeness, all responded well to the psychiatric medications, and the patient reports overall benefit other psychiatric hospitalization. Supportive psychotherapy was provided to the patient. The patient also participated in regular group therapy while hospitalized. Coping skills, problem solving as well as relaxation therapies were also part of the unit programming.  Labs were reviewed with the patient, and abnormal results were discussed with the patient.  The patient is able to verbalize their individual safety plan to this provider.  # It is recommended to the patient to continue psychiatric medications as prescribed, after discharge from the hospital.    # It is recommended to the patient to follow up with your outpatient psychiatric provider and PCP.  # It was discussed with the patient, the impact of alcohol, drugs, tobacco have been there overall psychiatric and medical wellbeing, and total abstinence from substance use was recommended the patient.ed.  # Prescriptions provided or sent directly to preferred pharmacy at discharge. Patient agreeable to plan. Given opportunity to ask questions. Appears to feel comfortable with discharge.    # In the event of worsening symptoms, the patient is instructed to call the crisis hotline, 911 and  or go to the nearest ED for appropriate evaluation and treatment of symptoms. To follow-up with primary care provider for other medical issues, concerns and or health care needs  # Patient was discharged to the [facility],  with a plan to follow up as noted below.   Physical Findings: AIMS:  , ,  ,  ,    CIWA:    COWS:     Aims score zero on my exam. No eps on my exam.   Musculoskeletal: Strength & Muscle Tone: within normal limits Gait & Station: normal Patient leans: N/A   Psychiatric Specialty Exam:  Presentation  General Appearance:  -- (scantilly clad)  Eye Contact: Fleeting  Speech: Normal Rate  Speech  Volume: Increased  Handedness:No data recorded  Mood and Affect  Mood: Anxious; Irritable  Affect: Full Range   Thought Process  Thought Processes: Linear  Descriptions of Associations:Intact  Orientation:Full (Time, Place and Person)  Thought Content:Logical  History of Schizophrenia/Schizoaffective disorder:No  Duration of Psychotic Symptoms:Less than six months  Hallucinations:No data recorded Ideas of Reference:None  Suicidal Thoughts:No data recorded Homicidal Thoughts:No data recorded  Sensorium  Memory: Immediate Fair; Recent Fair; Remote Fair  Judgment: Impaired  Insight: Lacking   Executive Functions  Concentration: Poor  Attention Span: Poor  Recall: [employer] of Knowledge: Fair  Language: Fair   Psychomotor Activity  Psychomotor Activity:No data recorded  Assets  Assets: Social Support; Resilience   Sleep  Sleep:No data recorded   Physical Exam: Physical Exam Vitals reviewed.  Constitutional:      Appearance: She is normal weight.  Pulmonary:     Effort: Pulmonary effort is normal.  Neurological:     Mental Status: She is alert.     Motor: No weakness.     Gait: Gait normal.  Psychiatric:        Mood and Affect: Mood normal.        Behavior: Behavior normal.        Thought Content: Thought content normal.        Judgment: Judgment normal.    Review of Systems  Constitutional:  Negative for chills and fever.  Cardiovascular:  Negative for chest pain and palpitations.  Neurological:  Negative for dizziness, tingling, tremors and headaches.  Psychiatric/Behavioral:  Negative for depression, hallucinations, memory loss, substance abuse and suicidal ideas. The patient is not nervous/anxious and does not have insomnia.   All other systems reviewed and are negative.  Blood pressure 97/62, pulse 85, temperature 97.9 F (36.6 C), temperature source Oral, resp. rate 17, height 5\' 6"  (1.676 m), weight 59.4 kg, SpO2  100%. Body mass index is 21.14 kg/m.   Social History   Tobacco Use  Smoking Status Former   Types: Cigarettes, Cigars  Smokeless Tobacco Never   Tobacco Cessation:  N/A, patient does not currently use tobacco products   Blood Alcohol level:  Lab Results  Component Value Date   ETH <10 10/24/2022   ETH <10 06/16/2022    Metabolic Disorder Labs:  Lab Results  Component Value Date   HGBA1C 5.7 (H) 06/27/2022   MPG 117 06/27/2022   No results found for: "PROLACTIN" Lab Results  Component Value Date   CHOL 124 06/27/2022   TRIG 40 06/27/2022   HDL 52 06/27/2022   CHOLHDL 2.4 06/27/2022   VLDL 8 06/27/2022   LDLCALC 64 06/27/2022    See Psychiatric Specialty Exam and Suicide Risk Assessment completed by Attending Physician prior to discharge.  Discharge destination:  Other:  [facility]  Is patient on multiple antipsychotic therapies at discharge:  Yes,   Do you recommend tapering to monotherapy for antipsychotics?  Yes   Has Patient had three or more failed trials of antipsychotic monotherapy by history:  Yes,   Antipsychotic medications that previously failed include:   1.  Zyprexa at 5 mg qhs., 2.  Zyprexa at 10 mg qhs., and 3.  Zyprexa at 20 mg at bedtime .  Recommended Plan for Multiple Antipsychotic Therapies: Taper to monotherapy as described:  taper off seroquel to zyprexa monotherapy   Discharge Instructions     Diet - low sodium heart healthy   Complete by: As directed    Increase activity slowly   Complete by: As directed       Allergies as of 10/31/2022   No Active Allergies      Medication List     TAKE these medications      Indication  hydrOXYzine 25 MG tablet Commonly known as: ATARAX Take 1 tablet (25 mg total) by mouth 3 (three) times daily as needed for anxiety.  Indication: Feeling Anxious   OLANZapine 20 MG tablet Commonly known as: ZYPREXA Take 1 tablet (20 mg total) by mouth at bedtime. Start taking on: November 01, 2022 What  changed:  medication strength how much to take when to take this Another medication with the same name was removed. Continue taking this medication, and follow the directions you see here.  Indication: Manic Phase of Manic-Depression   QUEtiapine 50 MG Tb24 24 hr tablet Commonly known as: SEROQUEL XR Take 2 tablets (100 mg total) by mouth at bedtime.  Indication: Manic-Depression   traZODone 100 MG tablet Commonly known as: DESYREL Take 1 tablet (100 mg total) by mouth at bedtime as needed for sleep.  Indication: Trouble Sleeping        Follow-up Information     [facility] Follow up on 11/08/2022.   Why: You have a hospital follow up appointment for therapy and medication management services on 11/08/22 at 1:45 pm.  This will be a Virtual telehealth appt. Contact information: [address]  [address] [place] [address] [phone]                 Follow-up recommendations:     Activity: as tolerated  Diet: heart healthy  Other: -Follow-up with your outpatient psychiatric provider -instructions on appointment date, time, and address (location) are provided to you in discharge paperwork.  -Take your psychiatric medications as prescribed at discharge - instructions are provided to you in the discharge paperwork  -Follow-up with outpatient primary care doctor and other specialists -for management of preventative medicine and chronic medical disease   -If you are prescribed an atypical antipsychotic medication, we recommend that your outpatient psychiatrist follow routine screening for side effects within 3 months of discharge, including monitoring: AIMS scale, height, weight, blood pressure, fasting lipid panel, HbA1c, and fasting blood sugar.   -Recommend total abstinence from alcohol, tobacco, and other illicit drug use at discharge.   -If your psychiatric symptoms recur, worsen, or if you have side effects to your psychiatric medications, call your  outpatient psychiatric provider, 911, [phone] or go to the nearest emergency department.  -If suicidal thoughts occur, immediately call your outpatient psychiatric provider, 911, 988 or go to the nearest emergency department.    Signed: Cristy Hilts, MD 10/31/2022, 10:06 AM   Total Time Spent in Direct Patient Care:  I personally spent 35 minutes on the  unit in direct patient care. The direct patient care time included face-to-face time with the patient, reviewing the patient's chart, communicating with other professionals, and coordinating care. Greater than 50% of this time was spent in counseling or coordinating care with the patient regarding goals of hospitalization, psycho-education, and discharge planning needs.   Phineas Inches, MD Psychiatrist

## 2022-10-31 NOTE — Progress Notes (Signed)
Patient refused to fill out a suicide safety plan.

## 2022-10-31 NOTE — Progress Notes (Signed)
  [facility] Adult Case Management Discharge Plan :  Will you be returning to the same living situation after discharge:  No. At discharge, do you have transportation home?: Yes,  Pt has refused assistance with a Taxi and states that she would prefer to take a "Lyft/Uber" and has asked to be discharged to the Lobby Do you have the ability to pay for your medications: Yes,  Medicaid /[employer]  Release of information consent forms completed and in the chart;  Patient's signature needed at discharge.  Patient to Follow up at:  Follow-up Information     [facility] Follow up on 11/08/2022.   Why: You have a hospital follow up appointment for therapy and medication management services on 11/08/22 at 1:45 pm.  This will be a Virtual telehealth appt. Contact information: [address]  [address] [place] [address] [phone]                 Next level of care provider has access to [facility]:no  Safety Planning and Suicide Prevention discussed: No.CSW contacted pt's Grandmother who does not consent to Safety Planning.     Has patient been referred to the Quitline?: Patient refused referral for treatment  Patient has been referred for addiction treatment: Yes, the patient will follow up with an outpatient provider for substance use disorder. Psychiatrist/APP: appointment made and Therapist: appointment made Patient to continue working towards treatment goals after discharge. Patient no longer meets criteria for inpatient criteria per attending physician. Continue taking medications as prescribed, nursing to provide instructions at discharge. Follow up with all scheduled appointments.   Roslin Norwood S Jaquavious Mercer, LCSW 10/31/2022, 2:10 PM

## 2022-10-31 NOTE — Progress Notes (Addendum)
Pt gave consent for this writer to contact her grandmother, with whom she has been speaking on the phone with, Jaclyn Owens [phone] -  When asked about if Jaclyn Owens has been in contact or visited the patient, she does not respond to this question. I asked if she thought the patient was at psychiatric baseline and/or safe for discharge, and Jaclyn Owens responds "that's for you to decide. I'm not going to tell you because you'll go back and tell the patient what I say. You're the doctor. You decide."  She then said she had another very important call on the other line.    The SW spoke with Jaclyn Owens, who requested a call back from the writer - I called Jaclyn Owens back at 120pm today - Jaclyn Owens states that the pt needs to be at a long term hospital like [facility] because when she leaves the hospital, she will stop taking her medications and get mentally ill again. States that pt is 'playing the role' in order to be dc. We discussed that pt has been appropriate with staff and other patients, taking medications, and not having any behavioral problems. We discussed the pt's diagnosis, hospital course, treatment, response to treatment, and discharge planning for today to the [facility] as pt has been evicted from her home. At the end of the call, the caller had no further questions.

## 2022-10-31 NOTE — Progress Notes (Signed)
Patient verbalizes readiness for discharge. Patient refused to fill out a suicide safety plan. All patient belongings returned to patient. Discharge instructions read and discussed with patient (appointments, medications, resources). Patient expressed gratitude for care provided. Patient discharged to lobby at 1415.

## 2022-10-31 NOTE — BHH Suicide Risk Assessment (Signed)
[facility] Discharge Suicide Risk Assessment   Principal Problem: Severe manic bipolar 1 disorder with psychotic behavior [facility] Discharge Diagnoses: Principal Problem:   Severe manic bipolar 1 disorder with psychotic behavior (HCC)   Total Time spent with patient: 20 minutes  Patient is a 27 year old female with a past psychiatric history of bipolar disorder who was admitted to the psychiatric hospital from [facility] after being taken there from jail for psychosis, mood lability, hostility and verbal combativeness. Patient was apparently IVC at the jail.   During the patient's hospitalization, patient had extensive initial psychiatric evaluation, and follow-up psychiatric evaluations every day.   Psychiatric diagnoses provided upon initial assessment:  Bipolar disorder, type I, current episode is manic    Patient's psychiatric medications were adjusted on admission:  -Stop Seroquel that was started by admitting provider.  Patient reports Zyprexa works better for her -Change Zyprexa from 5 mg twice daily to 10 mg nightly.     During the hospitalization, other adjustments were made to the patient's psychiatric medication regimen:  -Zyprexa increased to 20 mg at bedtime  -Seroquel was started at 100 mg at bedtime    Patient's care was discussed during the interdisciplinary team meeting every day during the hospitalization.   The patient denied having side effects to prescribed psychiatric medication.   Gradually, patient started adjusting to milieu. The patient was evaluated each day by a clinical provider to ascertain response to treatment. Improvement was noted by the patient's report of decreasing symptoms, improved sleep and appetite, affect, medication tolerance, behavior, and participation in unit programming.  Patient was asked each day to complete a self inventory noting mood, mental status, pain, new symptoms, anxiety and concerns.     Symptoms were reported as significantly  decreased or resolved completely by discharge.    On day of discharge, the patient reports that their mood is stable. The patient denied having suicidal thoughts for more than 48 hours prior to discharge.  Patient denies having homicidal thoughts.  Patient denies having auditory hallucinations.  Patient denies any visual hallucinations or other symptoms of psychosis. The patient was motivated to continue taking medication with a goal of continued improvement in mental health.    The patient reports their target psychiatric symptoms of psychosis, mood lability, hostility and verbal combativeness, all responded well to the psychiatric medications, and the patient reports overall benefit other psychiatric hospitalization. Supportive psychotherapy was provided to the patient. The patient also participated in regular group therapy while hospitalized. Coping skills, problem solving as well as relaxation therapies were also part of the unit programming.   Labs were reviewed with the patient, and abnormal results were discussed with the patient.   The patient is able to verbalize their individual safety plan to this provider.   # It is recommended to the patient to continue psychiatric medications as prescribed, after discharge from the hospital.     # It is recommended to the patient to follow up with your outpatient psychiatric provider and PCP.   # It was discussed with the patient, the impact of alcohol, drugs, tobacco have been there overall psychiatric and medical wellbeing, and total abstinence from substance use was recommended the patient.ed.   # Prescriptions provided or sent directly to preferred pharmacy at discharge. Patient agreeable to plan. Given opportunity to ask questions. Appears to feel comfortable with discharge.    # In the event of worsening symptoms, the patient is instructed to call the crisis hotline, 911 and or go to  the nearest ED for appropriate evaluation and treatment of  symptoms. To follow-up with primary care provider for other medical issues, concerns and or health care needs    Psychiatric Specialty Exam  Presentation  General Appearance:  -- (scantilly clad)  Eye Contact: Fleeting  Speech: Normal Rate  Speech Volume: Increased  Handedness:No data recorded  Mood and Affect  Mood: Anxious; Irritable  Duration of Depression Symptoms: No data recorded Affect: Full Range   Thought Process  Thought Processes: Linear  Descriptions of Associations:Intact  Orientation:Full (Time, Place and Person)  Thought Content:Logical  History of Schizophrenia/Schizoaffective disorder:No  Duration of Psychotic Symptoms:Less than six months  Hallucinations:No data recorded Ideas of Reference:None  Suicidal Thoughts:No data recorded Homicidal Thoughts:No data recorded  Sensorium  Memory: Immediate Fair; Recent Fair; Remote Fair  Judgment: Impaired  Insight: Lacking   Executive Functions  Concentration: Poor  Attention Span: Poor  Recall: [employer] of Knowledge: Fair  Language: Fair   Psychomotor Activity  Psychomotor Activity:No data recorded  Assets  Assets: Social Support; Resilience   Sleep  Sleep:No data recorded  Physical Exam: Physical Exam See discharge summary  ROS See discharge summary   Blood pressure 97/62, pulse 85, temperature 97.9 F (36.6 C), temperature source Oral, resp. rate 17, height 5\' 6"  (1.676 m), weight 59.4 kg, SpO2 100%. Body mass index is 21.14 kg/m.  Mental Status Per Nursing Assessment::   On Admission:  NA   Demographic factors:  Living alone, Unemployed, Adolescent or young adult  Loss Factors:  Loss of significant relationship, Financial problems / change in socioeconomic status ("My children are in DSS custody") Historical Factors:  Impulsivity Risk Reduction Factors:   Responsible for children under 56 years of age, Sense of responsibility to family, Positive  therapeutic relationship, and Positive coping skills or problem solving skills  Continued Clinical Symptoms:  Mood is stable, not appearing to be psychotic or reporting any psychotic symptoms. Denying any SI, HI.   Cognitive Features That Contribute To Risk:  None    Suicide Risk:   Mild:  There are no identifiable suicide plans, no associated intent, mild dysphoria and related symptoms, good self-control (both objective and subjective assessment), few other risk factors, and identifiable protective factors, including available and accessible social support.    Follow-up Information     [facility] Follow up on 11/08/2022.   Why: You have a hospital follow up appointment for therapy and medication management services on 11/08/22 at 1:45 pm.  This will be a Virtual telehealth appt. Contact information: [address]  [address] [place] [address] [phone]                 Plan Of Care/Follow-up recommendations:   -Follow-up with your outpatient psychiatric provider -instructions on appointment date, time, and address (location) are provided to you in discharge paperwork.   -Take your psychiatric medications as prescribed at discharge - instructions are provided to you in the discharge paperwork   -Follow-up with outpatient primary care doctor and other specialists -for management of preventative medicine and chronic medical disease    -If you are prescribed an atypical antipsychotic medication, we recommend that your outpatient psychiatrist follow routine screening for side effects within 3 months of discharge, including monitoring: AIMS scale, height, weight, blood pressure, fasting lipid panel, HbA1c, and fasting blood sugar.    -Recommend total abstinence from alcohol, tobacco, and other illicit drug use at discharge.    -If your psychiatric symptoms recur, worsen, or if you  have side effects to your psychiatric medications, call your outpatient psychiatric  provider, 911, [phone] or go to the nearest emergency department.   -If suicidal thoughts occur, immediately call your outpatient psychiatric provider, 911, [phone] or go to the nearest emergency department.     Cristy Hilts, MD 10/31/2022, 10:15 AM

## 2022-10-31 NOTE — Group Note (Signed)
Recreation Therapy Group Note   Group Topic:Health and Wellness  Group Date: 10/31/2022 Start Time: 1000 End Time: 1025 Facilitators: Jakavion Bilodeau-McCall, LRT,CTRS Location: 500 Hall Dayroom   Group Topic: Wellness  Goal Area(s) Addresses:  Patient will define components of whole wellness. Patient will verbalize benefit of whole wellness.  Group Description: Exercise. LRT explained to patients group would consist of completing at least 20 minutes of exercise. Patients would take turns leading the group in the exercises of their choosing. Patients were instructed to take breaks and/or get water as needed.   Education: Wellness, Building control surveyor.   Education Outcome: Acknowledges education/In group clarification offered/Needs additional education.    Affect/Mood: N/A   Participation Level: Did not attend    Clinical Observations/Individualized Feedback:     Plan: Continue to engage patient in RT group sessions 2-3x/week.   Aliannah Holstrom-McCall, LRT,CTRS 10/31/2022 11:16 AM

## 2022-10-31 NOTE — Progress Notes (Signed)
CSW communicated with Nurse Herbert Seta and Dr. Sherron Flemings. CSW was informed at the pt would be discharging today . Pt's Grandmother does not consent to safety and expressed concerns for pt's safety and mental health. Dr. Sherron Flemings spoke with pt's grandmother and will proceed with discharge. Pt has been scheduled for outpatient medication management and counseling at [facility] for 11-08-2022.

## 2023-06-06 ENCOUNTER — Ambulatory Visit (HOSPITAL_COMMUNITY)
Admission: EM | Admit: 2023-06-06 | Discharge: 2023-06-06 | Disposition: A | Discharge status: Home / Self Care | Payer: MEDICAID | Attending: Behavioral Health | Admitting: Behavioral Health

## 2023-06-06 DIAGNOSIS — Z79899 Other long term (current) drug therapy: Secondary | ICD-10-CM | POA: Insufficient documentation

## 2023-06-06 DIAGNOSIS — F909 Attention-deficit hyperactivity disorder, unspecified type: Secondary | ICD-10-CM | POA: Insufficient documentation

## 2023-06-06 DIAGNOSIS — Z5901 Sheltered homelessness: Secondary | ICD-10-CM | POA: Insufficient documentation

## 2023-06-06 DIAGNOSIS — Z760 Encounter for issue of repeat prescription: Secondary | ICD-10-CM | POA: Insufficient documentation

## 2023-06-06 DIAGNOSIS — F323 Major depressive disorder, single episode, severe with psychotic features: Secondary | ICD-10-CM | POA: Insufficient documentation

## 2023-06-06 DIAGNOSIS — Z560 Unemployment, unspecified: Secondary | ICD-10-CM | POA: Insufficient documentation

## 2023-06-06 DIAGNOSIS — F411 Generalized anxiety disorder: Secondary | ICD-10-CM | POA: Insufficient documentation

## 2023-06-06 DIAGNOSIS — F203 Undifferentiated schizophrenia: Secondary | ICD-10-CM

## 2023-06-06 MED ORDER — FLUOXETINE HCL 10 MG PO CAPS: 10.0000 mg | ORAL_CAPSULE | Freq: Every day | ORAL | Status: DC

## 2023-06-06 MED ORDER — FLUOXETINE HCL 10 MG PO CAPS: 10.0000 mg | ORAL_CAPSULE | Freq: Every day | ORAL | 3 refills | Status: DC

## 2023-06-06 MED ORDER — OLANZAPINE 10 MG PO TABS: 10.0000 mg | ORAL_TABLET | Freq: Every day | ORAL | 0 refills | Status: DC

## 2023-06-06 MED ORDER — OLANZAPINE 10 MG PO TABS: 10.0000 mg | ORAL_TABLET | Freq: Every day | ORAL | Status: DC

## 2023-06-06 NOTE — Discharge Instructions (Signed)
.  Adult Mental Health Resources  [facility] Address: [address] [place], [place] [address] Phone: [phone]  [facility], [facility] Address: [address] [place], [place] [address] Hours:  Closed ? Opens 8?AM Mon Phone: [phone]  [facility] Address: [address], [place], [place] [address] Phone: [phone]  [facility], [facility] Address: [address], [place], [place] [address] Phone: [phone]  [facility] Address: [place] [facility], [address], [place], [place] [address] Hours:  Closed ? Opens 8?AM Mon Phone: [phone]  [facility] Address: [address] [place], [place], [place] [address] Phone: [phone] INTAKE: [phone] Ext [phone] Counseling Group Address: [address] [place], [place] [address] Phone: [phone]  [facility] Address: [address], [place], [place] [address] Phone: [phone]  [facility] Address: [address], [place], [place] [address] Phone: [phone]  [facility] Address: [address] [place], [place] [address] Phone: [phone]  [facility] Phone: [phone]   [facility]  Address: [address] [place], [place] [address] Hours: Open 24 hours Phone: [phone]

## 2023-06-06 NOTE — ED Provider Notes (Addendum)
 Behavioral Health Urgent Care Medical Screening Exam  Patient Name: Jaclyn Owens MRN: [id number] Date of Evaluation: 06/06/23 Chief Complaint:  Major depressive disorder with psychotic features Diagnosis: MDD with psychotic features Schizophrenia Generalized anxiety disorder GAD ADHD  History of Present illness: Jaclyn Owens is a 28 y.o. African-American female who presents voluntarily to [facility] accompanied by her grandmother for medication refill after being discharged from prison today.  Patient was seen and examined sitting up in a chair in the assessment room.  Chart reviewed and findings shared with the treatment team and consult with attending psychiatrist with the recommendation to prescribe Zyprexa  10 mg p.o. q. nightly x 7 days for psychosis and fluoxetine  10 mg p.o. daily x 7 days for depression.  Patient encouraged to return for community clinic at [facility] from 7:00 to 2 PM daily for medication management.  Community resources for therapy provided.  Objective: Patient presents alert, oriented to person, time, place, and situation.  Reports mood is depressed and congruent with affect.  Rates depression as #4/10, with 10 being severe.  Report anxiety as #1/#10, with 10 being high severity.  Reports sleeping about 8 hours last night and having good appetite.  Patient obviously not responding to internal/external stimuli, however, report auditory hallucination this morning of hearing voices telling her 'you need to get a job and you are depressed.'  She denies SI, HI, or VH.  Patient further reports that she just got out of jail this morning and was picked up by her grandmother who brought her here to refill her medication before getting home.  Reports she has been on trial Hydroxyzine  25 mg p.o. 3 times daily as needed for anxiety, Olanzapine  5 mg p.o. daily and 10 mg p.o. at bedtime for her psychosis, and Seroquel  100 mg p.o. nightly for mood stabilization and  Trazodone  100 mg p.o. q. nightly as needed for insomnia.  Patient reports she is single and currently homeless, however will stay with the grandmother for few days prior to finding her own place.  Reports she has 3 children who are in the foster care.  Report unemployment and she was in jail for 8 months due to trespassing.  Patient had a 10 grade education and denies any trouble with the law or being affiliated with the [employer].  She denies access to weapons at this time.  Flowsheet Row ED from 06/06/2023 in [facility] Admission (Discharged) from 10/25/2022 in [facility] INPATIENT ADULT [facility] ED from 10/24/2022 in [facility] Emergency Department at [facility]  C-SSRS RISK CATEGORY No Risk No Risk No Risk       Psychiatric Specialty Exam  Presentation  General Appearance:Casual  Eye Contact:Good  Speech:Clear and Coherent; Normal Rate  Speech Volume:Normal  Handedness:Right  Mood and Affect  Mood: Anxious; Depressed  Affect: Congruent  Thought Process  Thought Processes: Coherent  Descriptions of Associations:Intact  Orientation:Full (Time, Place and Person)  Thought Content:Logical  Diagnosis of Schizophrenia or Schizoaffective disorder in past: No  Duration of Psychotic Symptoms: Less than six months  Hallucinations:None  Ideas of Reference:None  Suicidal Thoughts:No  Homicidal Thoughts:No  Sensorium  Memory: Immediate Good; Recent Fair  Judgment: Fair  Insight: Shallow  Executive Functions  Concentration: Fair  Attention Span: Fair  Recall: [employer] of Knowledge: Fair  Language: Fair  Psychomotor Activity  Psychomotor Activity: Restlessness  Assets  Assets: Manufacturing systems engineer; Desire for Improvement; Physical Health; Resilience  Sleep  Sleep:  Good  Number of hours:  8  Physical Exam: Physical Exam Vitals and nursing note reviewed.  Constitutional:      Appearance:  She is normal weight.  HENT:     Head: Normocephalic.     Right Ear: External ear normal.     Left Ear: External ear normal.     Nose: Nose normal.     Mouth/Throat:     Mouth: Mucous membranes are moist.     Pharynx: Oropharynx is clear.  Eyes:     Extraocular Movements: Extraocular movements intact.  Cardiovascular:     Rate and Rhythm: Normal rate.     Pulses: Normal pulses.  Pulmonary:     Effort: Pulmonary effort is normal.  Abdominal:     Comments: Deferred  Genitourinary:    Comments: Deferred Musculoskeletal:        General: Normal range of motion.     Cervical back: Normal range of motion.  Skin:    General: Skin is warm.  Neurological:     General: No focal deficit present.     Mental Status: She is alert and oriented to person, place, and time.  Psychiatric:        Mood and Affect: Mood normal.        Behavior: Behavior normal.    Review of Systems  Constitutional:  Negative for chills and fever.  HENT:  Negative for sore throat.   Eyes:  Negative for blurred vision and double vision.  Respiratory:  Negative for cough, sputum production, shortness of breath and wheezing.   Cardiovascular:  Negative for chest pain and palpitations.  Gastrointestinal:  Negative for heartburn, nausea and vomiting.  Genitourinary:  Negative for dysuria and urgency.  Musculoskeletal:  Negative for falls and myalgias.  Skin:  Negative for itching and rash.  Neurological:  Negative for dizziness and headaches.  Endo/Heme/Allergies:        See allergy listing  Psychiatric/Behavioral:  Positive for depression and hallucinations. Negative for substance abuse and suicidal ideas. The patient is nervous/anxious. The patient does not have insomnia.    Blood pressure 119/75, pulse 87, temperature 98.4 F (36.9 C), temperature source Oral, resp. rate 17, SpO2 98%. There is no height or weight on file to calculate BMI.  Musculoskeletal: Strength & Muscle Tone: within normal limits Gait  & Station: normal Patient leans: N/A   [facility] MSE Discharge Disposition for Follow up and Recommendations: Based on my evaluation the patient does not appear to have an emergency medical condition and can be discharged with resources and follow up care in outpatient services for Medication Management and Individual Therapy  .Aaron AasAdult Mental Health Resources  [facility] Address: [address] [place], [place] [address] Phone: [phone]  [facility], [facility] Address: [address], [place], [place] [address] Hours:  Closed ? Opens 8?AM Mon Phone: [phone]  [facility] Address: [address], [place], [place] [address] Phone: [phone]  [facility], [facility] Address: [address] [place], [place] [address] Phone: [phone]  [facility] Address: [place] [facility], [address], [place], [place] [address] Hours:  Closed ? Opens 8?AM Mon Phone: [phone]  [facility] Address: [address] [place], [place], [place] [address] Phone: [phone] INTAKE: [phone] Ext [phone] Counseling Group Address: [address], [place], [place] [address] Phone: [phone]  [facility] Address: [address], [place], [place]  [address] Phone: [phone]  [facility] Address: [address], [place], [place] [address] Phone: [phone]  [facility] Address: [address] [place]ada [address], [place] [address] Phone: [phone]  [facility] Phone: [phone]   [facility]  Address: [address] [place], [place] [address] Hours: Open 24 hours Phone: [phone]  Laurence Pons, FNP 06/06/2023, 12:22 PM

## 2023-06-06 NOTE — Progress Notes (Signed)
   06/06/23 1118  [facility] Triage Screening (Walk-ins at [facility] only)  How Did You Hear About Us ? Self  What Is the Reason for Your Visit/Call Today? [name] presents to [facility] voluntarily accompanied by her grandmother. Pt states that she needs refills on her medication. Pt currently denies SI, HI, AVH and alcohol/drug use. Pt shares that she has one more dosage of medication left.  How Long Has This Been Causing You Problems? <Week  Have You Recently Had Any Thoughts About Hurting Yourself? No  Are You Planning to Commit Suicide/Harm Yourself At This time? No  Have you Recently Had Thoughts About Hurting Someone Marigene Shoulder? No  Are You Planning To Harm Someone At This Time? No  Physical Abuse Denies  Verbal Abuse Denies  Sexual Abuse Denies  Exploitation of patient/patient's resources Denies  Self-Neglect Denies  Are you currently experiencing any auditory, visual or other hallucinations? No  Have You Used Any Alcohol or Drugs in the Past 24 Hours? No  Do you have any current medical co-morbidities that require immediate attention? No  Clinician description of patient physical appearance/behavior: calm, cooperative  What Do You Feel Would Help You the Most Today? Medication(s)  If access to [facility] Urgent Care was not available, would you have sought care in the Emergency Department? No  Determination of Need Routine (7 days)  Options For Referral Medication Management

## 2023-06-06 NOTE — ED Notes (Signed)
 Discharged home. AVS given and reviewed by provider.

## 2023-06-07 ENCOUNTER — Ambulatory Visit (INDEPENDENT_AMBULATORY_CARE_PROVIDER_SITE_OTHER): Payer: MEDICAID | Admitting: Student

## 2023-06-07 VITALS — Wt 190.0 lb

## 2023-06-07 DIAGNOSIS — Z79899 Other long term (current) drug therapy: Secondary | ICD-10-CM | POA: Diagnosis not present

## 2023-06-07 DIAGNOSIS — F319 Bipolar disorder, unspecified: Secondary | ICD-10-CM

## 2023-06-07 MED ORDER — OLANZAPINE 10 MG PO TABS
15.0000 mg | ORAL_TABLET | Freq: Every day | ORAL | 2 refills | Status: DC
Start: 2023-06-07 — End: 2023-11-06

## 2023-06-07 MED ORDER — FLUOXETINE HCL 10 MG PO CAPS
10.0000 mg | ORAL_CAPSULE | Freq: Every day | ORAL | 2 refills | Status: DC
Start: 2023-06-07 — End: 2023-11-06
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
## 2023-06-17 ENCOUNTER — Other Ambulatory Visit (HOSPITAL_COMMUNITY): Payer: MEDICAID

## 2023-07-22 ENCOUNTER — Telehealth (HOSPITAL_COMMUNITY): Payer: Self-pay

## 2023-07-22 ENCOUNTER — Other Ambulatory Visit (INDEPENDENT_AMBULATORY_CARE_PROVIDER_SITE_OTHER): Payer: MEDICAID

## 2023-07-22 DIAGNOSIS — Z79899 Other long term (current) drug therapy: Secondary | ICD-10-CM | POA: Diagnosis not present

## 2023-07-22 DIAGNOSIS — F319 Bipolar disorder, unspecified: Secondary | ICD-10-CM | POA: Diagnosis not present

## 2023-07-22 NOTE — Progress Notes (Signed)
 Pt tolerated labs well in right hand with no complaints.   JNL, CMA

## 2023-07-22 NOTE — Telephone Encounter (Signed)
 I had thought so was just making sure.    JNL

## 2023-07-22 NOTE — Telephone Encounter (Signed)
 Hello,   Pt requested for a refill of Fluoxetine  at lab appointment today states she's all out.

## 2023-07-23 LAB — CBC WITH DIFFERENTIAL/PLATELET
Basophils Absolute: 0 10*3/uL (ref 0.0–0.2)
Basos: 1 %
EOS (ABSOLUTE): 0.1 10*3/uL (ref 0.0–0.4)
Eos: 3 %
Hematocrit: 38.6 % (ref 34.0–46.6)
Hemoglobin: 12.4 g/dL (ref 11.1–15.9)
Immature Grans (Abs): 0 10*3/uL (ref 0.0–0.1)
Immature Granulocytes: 0 %
Lymphocytes Absolute: 2 10*3/uL (ref 0.7–3.1)
Lymphs: 38 %
MCH: 29.7 pg (ref 26.6–33.0)
MCHC: 32.1 g/dL (ref 31.5–35.7)
MCV: 93 fL (ref 79–97)
Monocytes Absolute: 0.5 10*3/uL (ref 0.1–0.9)
Monocytes: 10 %
Neutrophils Absolute: 2.6 10*3/uL (ref 1.4–7.0)
Neutrophils: 47 %
Platelets: 272 10*3/uL (ref 150–450)
RBC: 4.17 x10E6/uL (ref 3.77–5.28)
RDW: 12.9 % (ref 11.7–15.4)
WBC: 5.3 10*3/uL (ref 3.4–10.8)

## 2023-07-23 LAB — LIPID PANEL
Chol/HDL Ratio: 3.6 ratio (ref 0.0–4.4)
Cholesterol, Total: 155 mg/dL (ref 100–199)
HDL: 43 mg/dL (ref >39)
LDL Chol Calc (NIH): 100 mg/dL — ABNORMAL HIGH (ref 0–99)
Triglycerides: 60 mg/dL (ref 0–149)
VLDL Cholesterol Cal: 12 mg/dL (ref 5–40)

## 2023-07-23 LAB — COMPREHENSIVE METABOLIC PANEL WITH GFR
ALT: 11 IU/L (ref 0–32)
AST: 16 IU/L (ref 0–40)
Albumin: 4.3 g/dL (ref 4.0–5.0)
Alkaline Phosphatase: 27 IU/L — ABNORMAL LOW (ref 44–121)
BUN/Creatinine Ratio: 12 (ref 9–23)
BUN: 9 mg/dL (ref 6–20)
Bilirubin Total: 0.2 mg/dL (ref 0.0–1.2)
CO2: 19 mmol/L — ABNORMAL LOW (ref 20–29)
Calcium: 9.2 mg/dL (ref 8.7–10.2)
Chloride: 104 mmol/L (ref 96–106)
Creatinine, Ser: 0.78 mg/dL (ref 0.57–1.00)
Globulin, Total: 2.3 g/dL (ref 1.5–4.5)
Glucose: 99 mg/dL (ref 70–99)
Potassium: 4.3 mmol/L (ref 3.5–5.2)
Sodium: 143 mmol/L (ref 134–144)
Total Protein: 6.6 g/dL (ref 6.0–8.5)
eGFR: 107 mL/min/{1.73_m2} (ref >59)

## 2023-07-23 LAB — HEMOGLOBIN A1C
Est. average glucose Bld gHb Est-mCnc: 105 mg/dL
Hgb A1c MFr Bld: 5.3 % (ref 4.8–5.6)

## 2023-07-24 ENCOUNTER — Ambulatory Visit
Admission: EM | Admit: 2023-07-24 | Discharge: 2023-07-24 | Disposition: A | Discharge status: Home / Self Care | Payer: MEDICAID | Attending: Family Medicine | Admitting: Family Medicine

## 2023-07-24 DIAGNOSIS — N760 Acute vaginitis: Secondary | ICD-10-CM | POA: Insufficient documentation

## 2023-07-24 DIAGNOSIS — B9689 Other specified bacterial agents as the cause of diseases classified elsewhere: Secondary | ICD-10-CM | POA: Insufficient documentation

## 2023-07-24 DIAGNOSIS — Z113 Encounter for screening for infections with a predominantly sexual mode of transmission: Secondary | ICD-10-CM | POA: Insufficient documentation

## 2023-07-24 MED ORDER — FLUCONAZOLE 150 MG PO TABS: 150.0000 mg | ORAL_TABLET | ORAL | 0 refills | Status: DC

## 2023-07-24 MED ORDER — METRONIDAZOLE 500 MG PO TABS
500.0000 mg | ORAL_TABLET | Freq: Two times a day (BID) | ORAL | 0 refills | Status: DC
Start: 2023-07-24 — End: 2023-11-03

## 2023-07-24 NOTE — ED Triage Notes (Signed)
 Pt states she is here for STD testing. Pt denies any symptoms.  States she would like blood work done.

## 2023-07-24 NOTE — ED Provider Notes (Addendum)
 [facility] - [facility]RG[facility]ARE [facility]  Note:  This document was prepared using Conservation officer, historic buildings and may include unintentional dictation errors.  MRN: [id number] DOB: 1995-10-04  Subjective:   Jaclyn Owens is a 28 y.o. female presenting for 2 day history of vaginal discharge, vaginal itching. Denies fever, n/v, abdominal pain, pelvic pain, rashes, dysuria, urinary frequency, hematuria.  Would like STI testing.  No concern for pregnancy as she just finished her cycle.  No current facility-administered medications for this encounter.  Current Outpatient Medications:    FLUoxetine  (PROZAC ) 10 MG capsule, Take 1 capsule (10 mg total) by mouth daily., Disp: 30 capsule, Rfl: 2   OLANZapine  (ZYPREXA ) 10 MG tablet, Take 1.5 tablets (15 mg total) by mouth at bedtime. You may take half a tablet (5 mg total) as needed., Disp: 60 tablet, Rfl: 2   No Known Allergies  Past Medical History:  Diagnosis Date   Asthma    HSV (herpes simplex virus) infection      Past Surgical History:  Procedure Laterality Date   ARM WOUND REPAIR / CLOSURE      Family History  Problem Relation Age of Onset   Asthma Mother    Diabetes Maternal Aunt    Cancer Maternal Grandmother     Social History   Tobacco Use   Smoking status: Former    Types: Cigarettes, Cigars   Smokeless tobacco: Never  Vaping Use   Vaping status: Former  Substance Use Topics   Alcohol use: Not Currently    Comment: UTA, pt is not cooperative with triage questions 06/16/22   Drug use: Yes    Types: Marijuana    ROS   Objective:   Vitals: BP 118/70 (BP Location: Right Arm)   Pulse 81   Temp 98.4 F (36.9 C) (Oral)   Resp 16   LMP 07/19/2023 (Exact Date)   SpO2 96%   Physical Exam Constitutional:      General: She is not in acute distress.    Appearance: Normal appearance. She is well-developed. She is not ill-appearing, toxic-appearing or diaphoretic.  HENT:     Head: Normocephalic and  atraumatic.     Nose: Nose normal.     Mouth/Throat:     Mouth: Mucous membranes are moist.   Eyes:     General: No scleral icterus.       Right eye: No discharge.        Left eye: No discharge.     Extraocular Movements: Extraocular movements intact.    Cardiovascular:     Rate and Rhythm: Normal rate.  Pulmonary:     Effort: Pulmonary effort is normal.   Skin:    General: Skin is warm and dry.   Neurological:     General: No focal deficit present.     Mental Status: She is alert and oriented to person, place, and time.   Psychiatric:        Mood and Affect: Mood normal.        Behavior: Behavior normal.     Assessment and Plan :   PDMP not reviewed this encounter.  1. Acute vaginitis   2. Screen for STD (sexually transmitted disease)   3. Bacterial vaginosis    We will treat patient empirically for bacterial vaginosis with Flagyl  and for yeast vaginitis with fluconazole .  Labs pending. Counseled patient on potential for adverse effects with medications prescribed/recommended today, ER and return-to-clinic precautions discussed, patient verbalized understanding.     Christopher Savannah,  PA-C 07/24/23 1409

## 2023-07-25 LAB — CERVICOVAGINAL ANCILLARY ONLY
Bacterial Vaginitis (gardnerella): POSITIVE — AB
Candida Glabrata: NEGATIVE
Candida Vaginitis: NEGATIVE
Chlamydia: NEGATIVE
Comment: NEGATIVE
Comment: NEGATIVE
Comment: NEGATIVE
Comment: NEGATIVE
Comment: NEGATIVE
Comment: NORMAL
Neisseria Gonorrhea: NEGATIVE
Trichomonas: NEGATIVE

## 2023-07-25 LAB — RPR: RPR Ser Ql: NONREACTIVE

## 2023-07-25 LAB — HIV ANTIBODY (ROUTINE TESTING W REFLEX): HIV Screen 4th Generation wRfx: NONREACTIVE

## 2023-07-26 ENCOUNTER — Ambulatory Visit (HOSPITAL_COMMUNITY): Payer: Self-pay

## 2023-09-09 ENCOUNTER — Ambulatory Visit
Admission: EM | Admit: 2023-09-09 | Discharge: 2023-09-09 | Disposition: A | Discharge status: Home / Self Care | Payer: MEDICAID | Source: Ambulatory Visit | Attending: Family Medicine | Admitting: Family Medicine

## 2023-09-09 DIAGNOSIS — Z113 Encounter for screening for infections with a predominantly sexual mode of transmission: Secondary | ICD-10-CM | POA: Diagnosis not present

## 2023-09-09 DIAGNOSIS — R350 Frequency of micturition: Secondary | ICD-10-CM | POA: Diagnosis not present

## 2023-09-09 DIAGNOSIS — N760 Acute vaginitis: Secondary | ICD-10-CM | POA: Insufficient documentation

## 2023-09-09 DIAGNOSIS — N3000 Acute cystitis without hematuria: Secondary | ICD-10-CM | POA: Diagnosis not present

## 2023-09-09 LAB — POCT URINE DIPSTICK
Blood, UA: NEGATIVE
Glucose, UA: NEGATIVE mg/dL
Nitrite, UA: POSITIVE — AB
POC PROTEIN,UA: 30 — AB
Spec Grav, UA: 1.02 (ref 1.010–1.025)
Urobilinogen, UA: 1 U/dL
pH, UA: 7 (ref 5.0–8.0)

## 2023-09-09 MED ORDER — CLOTRIMAZOLE 1 % VA CREA: 1.0000 | TOPICAL_CREAM | Freq: Every day | VAGINAL | 0 refills | Status: DC

## 2023-09-09 MED ORDER — CEPHALEXIN 500 MG PO CAPS
500.0000 mg | ORAL_CAPSULE | Freq: Two times a day (BID) | ORAL | 0 refills | Status: DC
Start: 2023-09-09 — End: 2023-11-03

## 2023-09-09 MED ORDER — FLUCONAZOLE 150 MG PO TABS
150.0000 mg | ORAL_TABLET | ORAL | 0 refills | Status: AC
Start: 2023-09-09 — End: 2023-09-24

## 2023-09-09 NOTE — Discharge Instructions (Addendum)
 Please start cephalexin  to address an urinary tract infection. Use fluconazole  for suspected yeast vaginitis. Make sure you hydrate very well with plain water  and a quantity of 80 ounces of water  a day.  Please limit drinks that are considered urinary irritants such as soda, sweet tea, coffee, energy drinks, alcohol.  These can worsen your urinary and genital symptoms but also be the source of them.  I will let you know about your urine culture results through MyChart to see if we need to prescribe or change your antibiotics based off of those results.

## 2023-09-09 NOTE — ED Triage Notes (Signed)
 Patient presents for STD testing with blood work. Patient reports vaginal irritation x 1 week.

## 2023-09-09 NOTE — ED Provider Notes (Signed)
 [facility]  Note:  This document was prepared using Conservation officer, historic buildings and may include unintentional dictation errors.  MRN: [id number] DOB: [birth year]  Subjective:   Jaclyn Owens is a 28 y.o. female presenting for 1 week history of vaginal irritation, vaginal itching. Has had urinary frequency, dark urine. Denies fever, n/v, abdominal pain, pelvic pain, rashes, dysuria, hematuria, vaginal discharge.  Does not hydrate much with water .  Drinks soda and fruit juice daily.  Would like complete STI testing.  She does have a history of BV and yeast infections.  No concern for pregnancy per patient.  No current facility-administered medications for this encounter.  Current Outpatient Medications:    fluconazole  (DIFLUCAN ) 150 MG tablet, Take 1 tablet (150 mg total) by mouth once a week., Disp: 2 tablet, Rfl: 0   FLUoxetine  (PROZAC ) 10 MG capsule, Take 1 capsule (10 mg total) by mouth daily., Disp: 30 capsule, Rfl: 2   OLANZapine  (ZYPREXA ) 10 MG tablet, Take 1.5 tablets (15 mg total) by mouth at bedtime. You may take half a tablet (5 mg total) as needed., Disp: 60 tablet, Rfl: 2   metroNIDAZOLE  (FLAGYL ) 500 MG tablet, Take 1 tablet (500 mg total) by mouth 2 (two) times daily., Disp: 14 tablet, Rfl: 0   No Known Allergies  Past Medical History:  Diagnosis Date   Asthma    HSV (herpes simplex virus) infection      Past Surgical History:  Procedure Laterality Date   ARM WOUND REPAIR / CLOSURE      Family History  Problem Relation Age of Onset   Asthma Mother    Diabetes Maternal Aunt    Cancer Maternal Grandmother     Social History   Tobacco Use   Smoking status: Former    Types: Cigarettes, Cigars   Smokeless tobacco: Never  Vaping Use   Vaping status: Former  Substance Use Topics   Alcohol use: Not Currently    Comment: UTA, pt is not cooperative with triage questions 06/16/22   Drug use: Yes    Types: Marijuana     ROS   Objective:   Vitals: BP 113/75 (BP Location: Left Arm)   Pulse 92   Temp 98.7 F (37.1 C) (Oral)   Resp 18   LMP 08/17/2023 (Approximate)   SpO2 98%   Physical Exam Constitutional:      General: She is not in acute distress.    Appearance: Normal appearance. She is well-developed. She is not ill-appearing, toxic-appearing or diaphoretic.  HENT:     Head: Normocephalic and atraumatic.     Nose: Nose normal.     Mouth/Throat:     Mouth: Mucous membranes are moist.     Pharynx: Oropharynx is clear.  Eyes:     General: No scleral icterus.       Right eye: No discharge.        Left eye: No discharge.     Extraocular Movements: Extraocular movements intact.     Conjunctiva/sclera: Conjunctivae normal.  Cardiovascular:     Rate and Rhythm: Normal rate.  Pulmonary:     Effort: Pulmonary effort is normal.  Abdominal:     General: Bowel sounds are normal. There is no distension.     Palpations: Abdomen is soft. There is no mass.     Tenderness: There is no abdominal tenderness. There is no right CVA tenderness, left CVA tenderness, guarding or rebound.  Skin:    General: Skin is warm and  dry.  Neurological:     General: No focal deficit present.     Mental Status: She is alert and oriented to person, place, and time.  Psychiatric:        Mood and Affect: Mood normal.        Behavior: Behavior normal.        Thought Content: Thought content normal.        Judgment: Judgment normal.    Results for orders placed or performed during the hospital encounter of 09/09/23 (from the past 24 hours)  POCT URINE DIPSTICK     Status: Abnormal   Collection Time: 09/09/23  1:41 PM  Result Value Ref Range   Color, UA brown (A) yellow   Clarity, UA hazy (A) clear   Glucose, UA negative negative mg/dL   Bilirubin, UA small (A) negative   Ketones, POC UA small (15) (A) negative mg/dL   Spec Grav, UA 8.979 8.989 - 1.025   Blood, UA negative negative   pH, UA 7.0 5.0 - 8.0    POC PROTEIN,UA =30 (A) negative, trace   Urobilinogen, UA 1.0 0.2 or 1.0 E.U./dL   Nitrite, UA Positive (A) Negative   Leukocytes, UA Trace (A) Negative    Assessment and Plan :   PDMP not reviewed this encounter.  1. Acute cystitis without hematuria   2. Screen for STD (sexually transmitted disease)   3. Urinary frequency   4. Acute vaginitis    Start cephalexin  to cover for acute cystitis, urine culture pending.  Will also cover for suspected yeast infection with fluconazole .  Patient requested topical treatment to and therefore offered clotrimazole .  Vaginal cytology pending.  Recommended aggressive hydration, limiting urinary irritants. Counseled patient on potential for adverse effects with medications prescribed/recommended today, ER and return-to-clinic precautions discussed, patient verbalized understanding.    Christopher Savannah, PA-C 09/09/23 1349

## 2023-09-10 ENCOUNTER — Ambulatory Visit (HOSPITAL_COMMUNITY): Payer: Self-pay

## 2023-09-10 LAB — CERVICOVAGINAL ANCILLARY ONLY
Bacterial Vaginitis (gardnerella): NEGATIVE
Candida Glabrata: NEGATIVE
Candida Vaginitis: POSITIVE — AB
Chlamydia: NEGATIVE
Comment: NEGATIVE
Comment: NEGATIVE
Comment: NEGATIVE
Comment: NEGATIVE
Comment: NEGATIVE
Comment: NORMAL
Neisseria Gonorrhea: NEGATIVE
Trichomonas: NEGATIVE

## 2023-09-10 LAB — HIV ANTIBODY (ROUTINE TESTING W REFLEX): HIV Screen 4th Generation wRfx: NONREACTIVE

## 2023-09-10 LAB — RPR: RPR Ser Ql: NONREACTIVE

## 2023-09-11 LAB — URINE CULTURE: Culture: 10000 — AB

## 2023-11-02 ENCOUNTER — Ambulatory Visit (HOSPITAL_COMMUNITY)
Admission: EM | Admit: 2023-11-02 | Discharge: 2023-11-03 | Disposition: A | Discharge status: Other Acute Inpatient Hospital | Payer: MEDICAID | Attending: Urology | Admitting: Urology

## 2023-11-02 DIAGNOSIS — F251 Schizoaffective disorder, depressive type: Secondary | ICD-10-CM | POA: Insufficient documentation

## 2023-11-02 MED ORDER — LORAZEPAM 2 MG/ML IJ SOLN: 2.0000 mg | Freq: Three times a day (TID) | INTRAMUSCULAR | Status: DC | PRN

## 2023-11-02 MED ORDER — DIPHENHYDRAMINE HCL 50 MG/ML IJ SOLN: 50.0000 mg | Freq: Three times a day (TID) | INTRAMUSCULAR | Status: DC | PRN

## 2023-11-02 MED ORDER — HALOPERIDOL 5 MG PO TABS: 5.0000 mg | ORAL_TABLET | Freq: Three times a day (TID) | ORAL | Status: DC | PRN

## 2023-11-02 MED ORDER — OLANZAPINE 7.5 MG PO TABS
15.0000 mg | ORAL_TABLET | Freq: Every day | ORAL | Status: DC
  Administered 2023-11-03: 15 mg via ORAL
  Filled 2023-11-02: qty 2

## 2023-11-02 MED ORDER — HYDROXYZINE HCL 25 MG PO TABS
25.0000 mg | ORAL_TABLET | Freq: Three times a day (TID) | ORAL | Status: DC | PRN
  Administered 2023-11-03: 25 mg via ORAL
  Filled 2023-11-02: qty 1

## 2023-11-02 MED ORDER — HALOPERIDOL LACTATE 5 MG/ML IJ SOLN: 5.0000 mg | Freq: Three times a day (TID) | INTRAMUSCULAR | Status: DC | PRN

## 2023-11-02 MED ORDER — ACETAMINOPHEN 325 MG PO TABS: 650.0000 mg | ORAL_TABLET | Freq: Four times a day (QID) | ORAL | Status: DC | PRN

## 2023-11-02 MED ORDER — HALOPERIDOL LACTATE 5 MG/ML IJ SOLN: 10.0000 mg | Freq: Three times a day (TID) | INTRAMUSCULAR | Status: DC | PRN

## 2023-11-02 MED ORDER — DIPHENHYDRAMINE HCL 50 MG PO CAPS: 50.0000 mg | ORAL_CAPSULE | Freq: Three times a day (TID) | ORAL | Status: DC | PRN

## 2023-11-02 MED ORDER — MAGNESIUM HYDROXIDE 400 MG/5ML PO SUSP: 30.0000 mL | Freq: Every day | ORAL | Status: DC | PRN

## 2023-11-02 MED ORDER — ALUM & MAG HYDROXIDE-SIMETH 200-200-20 MG/5ML PO SUSP: 30.0000 mL | ORAL | Status: DC | PRN

## 2023-11-02 MED ORDER — TRAZODONE HCL 50 MG PO TABS
50.0000 mg | ORAL_TABLET | Freq: Every evening | ORAL | Status: DC | PRN
  Administered 2023-11-03: 50 mg via ORAL
  Filled 2023-11-02: qty 1

## 2023-11-02 MED ORDER — FLUOXETINE HCL 10 MG PO CAPS
10.0000 mg | ORAL_CAPSULE | Freq: Every day | ORAL | Status: DC
  Administered 2023-11-03: 10 mg via ORAL
  Filled 2023-11-02: qty 1
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
## 2023-11-03 ENCOUNTER — Inpatient Hospital Stay (HOSPITAL_COMMUNITY)
Admission: AD | Admit: 2023-11-03 | Discharge: 2023-11-06 | DRG: 885 | Disposition: A | Discharge status: Home / Self Care | Payer: MEDICAID | Attending: Student in an Organized Health Care Education/Training Program | Admitting: Student in an Organized Health Care Education/Training Program

## 2023-11-03 ENCOUNTER — Other Ambulatory Visit: Payer: Self-pay

## 2023-11-03 ENCOUNTER — Encounter (HOSPITAL_COMMUNITY): Payer: Self-pay | Admitting: Behavioral Health

## 2023-11-03 DIAGNOSIS — F419 Anxiety disorder, unspecified: Secondary | ICD-10-CM | POA: Diagnosis present

## 2023-11-03 DIAGNOSIS — Z825 Family history of asthma and other chronic lower respiratory diseases: Secondary | ICD-10-CM

## 2023-11-03 DIAGNOSIS — F1990 Other psychoactive substance use, unspecified, uncomplicated: Secondary | ICD-10-CM | POA: Insufficient documentation

## 2023-11-03 DIAGNOSIS — Z5941 Food insecurity: Secondary | ICD-10-CM | POA: Diagnosis not present

## 2023-11-03 DIAGNOSIS — Z833 Family history of diabetes mellitus: Secondary | ICD-10-CM

## 2023-11-03 DIAGNOSIS — Z608 Other problems related to social environment: Secondary | ICD-10-CM | POA: Diagnosis present

## 2023-11-03 DIAGNOSIS — Z5902 Unsheltered homelessness: Secondary | ICD-10-CM | POA: Diagnosis not present

## 2023-11-03 DIAGNOSIS — F319 Bipolar disorder, unspecified: Principal | ICD-10-CM | POA: Diagnosis present

## 2023-11-03 DIAGNOSIS — J45909 Unspecified asthma, uncomplicated: Secondary | ICD-10-CM | POA: Diagnosis present

## 2023-11-03 DIAGNOSIS — Z5948 Other specified lack of adequate food: Secondary | ICD-10-CM

## 2023-11-03 DIAGNOSIS — Z79899 Other long term (current) drug therapy: Secondary | ICD-10-CM | POA: Diagnosis not present

## 2023-11-03 DIAGNOSIS — F3132 Bipolar disorder, current episode depressed, moderate: Secondary | ICD-10-CM | POA: Diagnosis not present

## 2023-11-03 DIAGNOSIS — F1220 Cannabis dependence, uncomplicated: Secondary | ICD-10-CM | POA: Diagnosis present

## 2023-11-03 DIAGNOSIS — Z8619 Personal history of other infectious and parasitic diseases: Secondary | ICD-10-CM

## 2023-11-03 DIAGNOSIS — F251 Schizoaffective disorder, depressive type: Secondary | ICD-10-CM

## 2023-11-03 DIAGNOSIS — Z8632 Personal history of gestational diabetes: Secondary | ICD-10-CM

## 2023-11-03 DIAGNOSIS — Z87891 Personal history of nicotine dependence: Secondary | ICD-10-CM

## 2023-11-03 DIAGNOSIS — R45851 Suicidal ideations: Secondary | ICD-10-CM | POA: Diagnosis present

## 2023-11-03 DIAGNOSIS — F259 Schizoaffective disorder, unspecified: Principal | ICD-10-CM | POA: Diagnosis present

## 2023-11-03 DIAGNOSIS — F1994 Other psychoactive substance use, unspecified with psychoactive substance-induced mood disorder: Secondary | ICD-10-CM | POA: Diagnosis present

## 2023-11-03 DIAGNOSIS — F1120 Opioid dependence, uncomplicated: Secondary | ICD-10-CM | POA: Diagnosis present

## 2023-11-03 DIAGNOSIS — F3130 Bipolar disorder, current episode depressed, mild or moderate severity, unspecified: Secondary | ICD-10-CM | POA: Diagnosis not present

## 2023-11-03 LAB — COMPREHENSIVE METABOLIC PANEL WITH GFR
ALT: 17 U/L (ref 0–44)
AST: 19 U/L (ref 15–41)
Albumin: 3.9 g/dL (ref 3.5–5.0)
Alkaline Phosphatase: 20 U/L — ABNORMAL LOW (ref 38–126)
Anion gap: 10 (ref 5–15)
BUN: <5 mg/dL — ABNORMAL LOW (ref 6–20)
CO2: 25 mmol/L (ref 22–32)
Calcium: 9.2 mg/dL (ref 8.9–10.3)
Chloride: 103 mmol/L (ref 98–111)
Creatinine, Ser: 0.85 mg/dL (ref 0.44–1.00)
GFR, Estimated: >60 mL/min (ref >60)
Glucose, Bld: 95 mg/dL (ref 70–99)
Potassium: 3.8 mmol/L (ref 3.5–5.1)
Sodium: 138 mmol/L (ref 135–145)
Total Bilirubin: 0.7 mg/dL (ref 0.0–1.2)
Total Protein: 7.2 g/dL (ref 6.5–8.1)

## 2023-11-03 LAB — SARS CORONAVIRUS 2 BY RT PCR: SARS Coronavirus 2 by RT PCR: NEGATIVE

## 2023-11-03 LAB — CBC WITH DIFFERENTIAL/PLATELET
Abs Immature Granulocytes: 0.03 K/uL (ref 0.00–0.07)
Basophils Absolute: 0.1 K/uL (ref 0.0–0.1)
Basophils Relative: 1 %
Eosinophils Absolute: 0.2 K/uL (ref 0.0–0.5)
Eosinophils Relative: 2 %
HCT: 37.1 % (ref 36.0–46.0)
Hemoglobin: 12.4 g/dL (ref 12.0–15.0)
Immature Granulocytes: 0 %
Lymphocytes Relative: 35 %
Lymphs Abs: 3.6 K/uL (ref 0.7–4.0)
MCH: 29.6 pg (ref 26.0–34.0)
MCHC: 33.4 g/dL (ref 30.0–36.0)
MCV: 88.5 fL (ref 80.0–100.0)
Monocytes Absolute: 0.7 K/uL (ref 0.1–1.0)
Monocytes Relative: 7 %
Neutro Abs: 5.6 K/uL (ref 1.7–7.7)
Neutrophils Relative %: 55 %
Platelets: 261 K/uL (ref 150–400)
RBC: 4.19 MIL/uL (ref 3.87–5.11)
RDW: 13.2 % (ref 11.5–15.5)
WBC: 10.2 K/uL (ref 4.0–10.5)
nRBC: 0 % (ref 0.0–0.2)

## 2023-11-03 LAB — POCT URINE DRUG SCREEN - MANUAL ENTRY (I-SCREEN)
POC Amphetamine UR: NOT DETECTED
POC Buprenorphine (BUP): NOT DETECTED
POC Cocaine UR: NOT DETECTED
POC Marijuana UR: POSITIVE — AB
POC Methadone UR: NOT DETECTED
POC Methamphetamine UR: NOT DETECTED
POC Morphine: NOT DETECTED
POC Oxazepam (BZO): NOT DETECTED
POC Oxycodone UR: NOT DETECTED
POC Secobarbital (BAR): NOT DETECTED

## 2023-11-03 LAB — LIPID PANEL
Cholesterol: 168 mg/dL (ref 0–200)
HDL: 47 mg/dL (ref >40)
LDL Cholesterol: 106 mg/dL — ABNORMAL HIGH (ref 0–99)
Total CHOL/HDL Ratio: 3.6 ratio
Triglycerides: 73 mg/dL (ref <150)
VLDL: 15 mg/dL (ref 0–40)

## 2023-11-03 LAB — POCT PREGNANCY, URINE: Preg Test, Ur: NEGATIVE

## 2023-11-03 LAB — TSH: TSH: 2.61 u[IU]/mL (ref 0.350–4.500)

## 2023-11-03 LAB — ETHANOL: Alcohol, Ethyl (B): <15 mg/dL (ref <15)

## 2023-11-03 LAB — HEMOGLOBIN A1C
Hgb A1c MFr Bld: 4.9 % (ref 4.8–5.6)
Mean Plasma Glucose: 93.93 mg/dL

## 2023-11-03 MED ORDER — ACETAMINOPHEN 325 MG PO TABS: 650.0000 mg | ORAL_TABLET | Freq: Four times a day (QID) | ORAL | Status: DC | PRN

## 2023-11-03 MED ORDER — LORAZEPAM 2 MG/ML IJ SOLN: 2.0000 mg | Freq: Three times a day (TID) | INTRAMUSCULAR | Status: DC | PRN

## 2023-11-03 MED ORDER — DIPHENHYDRAMINE HCL 25 MG PO CAPS: 50.0000 mg | ORAL_CAPSULE | Freq: Three times a day (TID) | ORAL | Status: DC | PRN

## 2023-11-03 MED ORDER — OLANZAPINE 7.5 MG PO TABS
15.0000 mg | ORAL_TABLET | Freq: Every day | ORAL | Status: DC
  Administered 2023-11-03 – 2023-11-05 (×3): 15 mg via ORAL
  Filled 2023-11-03 (×3): qty 2

## 2023-11-03 MED ORDER — HALOPERIDOL 5 MG PO TABS: 5.0000 mg | ORAL_TABLET | Freq: Three times a day (TID) | ORAL | Status: DC | PRN

## 2023-11-03 MED ORDER — DIPHENHYDRAMINE HCL 50 MG/ML IJ SOLN: 50.0000 mg | Freq: Three times a day (TID) | INTRAMUSCULAR | Status: DC | PRN

## 2023-11-03 MED ORDER — HYDROXYZINE HCL 25 MG PO TABS
25.0000 mg | ORAL_TABLET | Freq: Three times a day (TID) | ORAL | Status: DC | PRN
  Administered 2023-11-03 – 2023-11-05 (×3): 25 mg via ORAL
  Filled 2023-11-03 (×3): qty 1

## 2023-11-03 MED ORDER — ALUM & MAG HYDROXIDE-SIMETH 200-200-20 MG/5ML PO SUSP: 30.0000 mL | ORAL | Status: DC | PRN

## 2023-11-03 MED ORDER — HALOPERIDOL LACTATE 5 MG/ML IJ SOLN: 5.0000 mg | Freq: Three times a day (TID) | INTRAMUSCULAR | Status: DC | PRN

## 2023-11-03 MED ORDER — FLUOXETINE HCL 10 MG PO CAPS
10.0000 mg | ORAL_CAPSULE | Freq: Every day | ORAL | Status: DC
Start: 2023-11-04 — End: 2023-11-04
  Administered 2023-11-04: 10 mg via ORAL
  Filled 2023-11-03: qty 1

## 2023-11-03 MED ORDER — MAGNESIUM HYDROXIDE 400 MG/5ML PO SUSP: 30.0000 mL | Freq: Every day | ORAL | Status: DC | PRN

## 2023-11-03 MED ORDER — HALOPERIDOL LACTATE 5 MG/ML IJ SOLN: 10.0000 mg | Freq: Three times a day (TID) | INTRAMUSCULAR | Status: DC | PRN

## 2023-11-03 NOTE — ED Notes (Signed)
 Pt is a 28 y.o female who came in with her grandmother to [facility] voluntary with complain of  SI.  Patient says she has plan to overdose and has the pills to do so.  Last attempt was 2 months ago when she was cutting her arms.  Patient has some HI towards just anybody.  She has been seeing spirits.  Patient sometimes hearing voices.  The voices say a lot of different things.  Pt denies any access to a gun.  [name].  Pt says that she has been taking her meds as directed. Patient reports homelessness for the last 3 months. Skin assessment completed now in bed appears asleep.

## 2023-11-03 NOTE — ED Notes (Signed)
 Pt reports passive SI since admitting to unit. She contracts for safety. She denies HI but reports AVH prior to admission. No noted distress, will continue to monitor for safety. Food provided.

## 2023-11-03 NOTE — ED Notes (Signed)
 Pt observed/assessed in recliner sleeping. RR even and unlabored, appearing in no noted distress. Environmental check complete, will continue to monitor for safety

## 2023-11-03 NOTE — Discharge Instructions (Signed)
 Transfer to [facility]

## 2023-11-03 NOTE — Group Note (Signed)
 Date:  11/03/2023 Time:  8:40 PM  Group Topic/Focus:  Wrap-Up Group:   The focus of this group is to help patients review their daily goal of treatment and discuss progress on daily workbooks.    Additional Comments:  Pt was encouraged, but opted out of attending wrap up group this evening.  Jaclyn Owens 11/03/2023, 8:40 PM

## 2023-11-03 NOTE — Progress Notes (Signed)
 Patient has been denied by [facility] due to no appropriate beds available. Patient meets BH inpatient criteria per Kathryne Show, NP. Patient has been faxed out to the following facilities:   [facility]  [address] [place] [place] [address] [phone]  [name]  [address] [place] [address] [phone]  [facility]  [address] [place], [place] [address] [phone]  [facility]  [address] [place], [place] [address] [phone]  [facility]  [address], [place] [address] [phone]  [facility]  [address] [place] [address] [phone]  [facility]  [address] [place], [place] [phone]  [facility]  [address] [place] [address] [phone]  [facility]  [address], [place] [address] [phone]  [facility]  [address] [place] [address] [phone]  [facility] Patient Placement  [facility], [place] [phone]  [facility] [address]  [place] [address] [phone]  [facility]  [address] [place] [address] [phone]  [facility]  [address] [place] [address] [phone]  [facility]  [address] [place] [address] [phone]  [facility]  [address], [place] [address] [phone]  [facility]  [address] [place] [address] [phone]   [facility]  [address] [place] [address] [phone]   Bunnie Gallop, MSW, LCSW-A  10:27 AM 11/03/2023

## 2023-11-03 NOTE — Tx Team (Signed)
 Initial Treatment Plan 11/03/2023 6:15 PM Jaclyn Owens FMW:[id number]    PATIENT STRESSORS: Financial difficulties   Marital or family conflict     PATIENT STRENGTHS: Ability for insight  Capable of independent living  Motivation for treatment/growth    PATIENT IDENTIFIED PROBLEMS: Need help with housing  Food  Suicidal thoughts  Depression  Auditory and visual hallucinations  Homelessness  Ineffective coping skills         DISCHARGE CRITERIA:  Ability to meet basic life and health needs Safe-care adequate arrangements made  PRELIMINARY DISCHARGE PLAN: Attend aftercare/continuing care group Outpatient therapy Placement in alternative living arrangements  PATIENT/FAMILY INVOLVEMENT: This treatment plan has been presented to and reviewed with the patient, Jaclyn Owens, and/or family member.  The patient and family have been given the opportunity to ask questions and make suggestions.  Jaclyn MALVA Lowers, RN 11/03/2023, 6:15 PM

## 2023-11-03 NOTE — ED Provider Notes (Signed)
 OBS Discharge Summary  Date and Time: 11/03/2023 10:38 AM  Name: Jaclyn Owens  MRN:  [id number]   Discharge Diagnoses:  Final diagnoses:  Schizoaffective disorder, depressive type (HCC)    Subjective: Patient seen and evaluated face-to-face by this provider, chart reviewed and case discussed with morning treatment team. On evaluation, patient is alert and oriented x 4. [name] Thought content is positive for passive SI with no plan at this time and negative for HI/AVH. Objectively, no signs of acute psychosis. [name], decreased motivation, isolating, anger, and not wanting to do anything. Her affect is congruent. She denies symptoms of mania and no signs of acute mania on exam. She reports fair sleep with taking medications. [name] [name], 78, and 41 years old being placed in foster care one year ago. She denies medication side effects. She denies physical complaints. [name] [name] [name], aggressive, psychotic, or self-harm behaviors. She has remained calm and cooperative.  Stay Summary: Per Jerona Simpers, TTS counselor, Pt came in with [name] [name] Patient says she has plan to overdose and has the pills to do so. Last attempt was 2 months ago when she was cutting her arms, it went unreported. Patient has some HI towards just anybody. She has been seeing spirits. Patient sometimes hearing voices. The voices say a lot of different things. Pt denies any access to a gun. [name], about a joint worth. Patient medication management is through [facility]. She says she needs more fluoxetine , alazapine,  Seroquel . Pt says that she has been taking her meds as directed. She says she needs another medication to address her herpes too. Also has a therapist, Dr. Prentice through [facility]. [name] Patient has not been getting any sleep and not been wanting to eat. She missed some appointments with [facility] and that is what the appointments are this week. Last year was her last hospitalization.   Total Time spent with patient: 30 minutes  Past Psychiatric History: A documented history history of adjustment disorder with depressed mood, schizoaffective and bipolar with psychotic features. [name]  Past Medical History: A reported history of herpes  Family Psychiatric History: No reported history  Social History: Single. Children placed in foster care. Marijuana use.  Tobacco Cessation:  N/A, patient does not currently use tobacco products  Current Medications:  Current Facility-Administered Medications  Medication Dose Route Frequency Provider Last Rate Last Admin   acetaminophen  (TYLENOL ) tablet 650 mg  650 mg Oral Q6H PRN Ajibola, Ene A, NP       alum & mag hydroxide-simeth (MAALOX/MYLANTA) 200-200-20 MG/5ML suspension 30 mL  30 mL Oral Q4H PRN Ajibola, Ene A, NP       haloperidol  (HALDOL ) tablet 5 mg  5 mg Oral TID PRN Ajibola, Ene A, NP       And   diphenhydrAMINE  (BENADRYL ) capsule 50 mg  50 mg Oral TID PRN Ajibola, Ene A, NP       haloperidol  lactate (HALDOL ) injection 5 mg  5 mg Intramuscular TID PRN Ajibola,  Ene A, NP       And   diphenhydrAMINE  (BENADRYL ) injection 50 mg  50 mg Intramuscular TID PRN Ajibola, Ene A, NP       And   LORazepam  (ATIVAN ) injection 2 mg  2 mg Intramuscular TID PRN Ajibola, Ene A, NP       haloperidol  lactate (HALDOL ) injection 10 mg  10 mg Intramuscular TID PRN Ajibola, Ene A, NP       And   diphenhydrAMINE  (BENADRYL ) injection 50 mg  50 mg Intramuscular TID  PRN Ajibola, Ene A, NP       And   LORazepam  (ATIVAN ) injection 2 mg  2 mg Intramuscular TID PRN Ajibola, Ene A, NP       FLUoxetine  (PROZAC ) capsule 10 mg  10 mg Oral Daily Ajibola, Ene A, NP       hydrOXYzine  (ATARAX ) tablet 25 mg  25 mg Oral TID PRN Ajibola, Ene A, NP   25 mg at 11/03/23 0050   magnesium  hydroxide (MILK OF MAGNESIA) suspension 30 mL  30 mL Oral Daily PRN Ajibola, Ene A, NP       OLANZapine  (ZYPREXA ) tablet 15 mg  15 mg Oral QHS Ajibola, Ene A, NP   15 mg at 11/03/23 0050   traZODone  (DESYREL ) tablet 50 mg  50 mg Oral QHS PRN Ajibola, Ene A, NP   50 mg at 11/03/23 0050   Current Outpatient Medications  Medication Sig Dispense Refill   FLUoxetine  (PROZAC ) 10 MG capsule Take 1 capsule (10 mg total) by mouth daily. 30 capsule 2   OLANZapine  (ZYPREXA ) 10 MG tablet Take 1.5 tablets (15 mg total) by mouth at bedtime. You may take half a tablet (5 mg total) as needed. 60 tablet 2    PTA Medications:  Facility Ordered Medications  Medication   acetaminophen  (TYLENOL ) tablet 650 mg   alum & mag hydroxide-simeth (MAALOX/MYLANTA) 200-200-20 MG/5ML suspension 30 mL   magnesium  hydroxide (MILK OF MAGNESIA) suspension 30 mL   haloperidol  (HALDOL ) tablet 5 mg   And   diphenhydrAMINE  (BENADRYL ) capsule 50 mg   haloperidol  lactate (HALDOL ) injection 5 mg   And   diphenhydrAMINE  (BENADRYL ) injection 50 mg   And   LORazepam  (ATIVAN ) injection 2 mg   haloperidol  lactate (HALDOL ) injection 10 mg   And   diphenhydrAMINE  (BENADRYL ) injection 50 mg   And   LORazepam  (ATIVAN ) injection 2 mg   hydrOXYzine  (ATARAX ) tablet 25 mg   traZODone  (DESYREL ) tablet 50 mg   FLUoxetine  (PROZAC ) capsule 10 mg   OLANZapine  (ZYPREXA ) tablet 15 mg   PTA Medications  Medication Sig   OLANZapine  (ZYPREXA ) 10 MG tablet Take 1.5 tablets (15 mg total) by mouth at bedtime. You may take half a tablet (5 mg total) as needed.   FLUoxetine  (PROZAC ) 10 MG capsule Take 1 capsule (10 mg total) by mouth daily.        09/26/2017    2:42 PM  Depression screen PHQ 2/9  Decreased Interest 0  Down, Depressed, Hopeless 0  PHQ - 2 Score 0  Altered sleeping 1  Tired, decreased energy 1  Change in appetite 0  Feeling bad or failure about yourself  0  Trouble concentrating 0  Moving slowly or fidgety/restless 0  Suicidal thoughts 0  PHQ-9 Score 2  Difficult doing work/chores Not difficult at all    Flowsheet Row ED from 11/02/2023 in [facility] from 09/09/2023 in [facility] [employer] [facility]  [place] UC from 07/24/2023 in [facility])  C-SSRS RISK CATEGORY High Risk No Risk No Risk    Musculoskeletal  Strength & Muscle Tone: within normal limits Gait & Station: normal Patient leans: N/A  Psychiatric Specialty Exam  Presentation  General Appearance:  Appropriate for Environment  Eye Contact: Fair  Speech: Clear and Coherent  Speech Volume: Normal  Handedness: Right   Mood and Affect  Mood: Depressed  Affect: Congruent   Thought Process  Thought Processes: Coherent; Linear  Descriptions of Associations:Intact  Orientation:Full (Time, Place and Person)  Thought Content:Logical  Diagnosis of Schizophrenia or Schizoaffective disorder in past: No  Duration of Psychotic Symptoms: N/A   Hallucinations:Hallucinations: None Description of Auditory Hallucinations: hearing command to hurt herself Description of Visual Hallucinations: spirits, shadow, people  Ideas of Reference:None  Suicidal Thoughts:Passive SI  Homicidal Thoughts:Homicidal Thoughts: No   Sensorium  Memory: Immediate Fair; Recent Fair; Remote Fair  Judgment: Intact  Insight: Present   Executive Functions  Concentration: Fair  Attention Span: Fair  Recall: [employer] of Knowledge: Fair  Language: Fair   Psychomotor Activity  Psychomotor Activity: Psychomotor Activity:  Normal   Assets  Assets: Communication Skills; Desire for Improvement   Sleep  Sleep: Sleep: Fair  No Safety Checks orders active in given range  Nutritional Assessment (For OBS and FBC admissions only) Has the patient had a weight loss or gain of 10 pounds or more in the last 3 months?: No Has the patient had a decrease in food intake/or appetite?: No Does the patient have dental problems?: No Does the patient have eating habits or behaviors that may be indicators of an eating disorder including binging or inducing vomiting?: No Has the patient recently lost weight without trying?: 0 Has the patient been eating poorly because of a decreased appetite?: 0 Malnutrition Screening Tool Score: 0    Physical Exam  Physical Exam Cardiovascular:     Rate and Rhythm: Normal rate.  Pulmonary:     Effort: Pulmonary effort is normal.  Musculoskeletal:        General: Normal range of motion.     Cervical back: Normal range of motion.  Neurological:     Mental Status: She is alert and oriented to person, place, and time.    Review of Systems  Constitutional: Negative.   HENT: Negative.    Eyes: Negative.   Respiratory: Negative.    Cardiovascular: Negative.   Gastrointestinal: Negative.   Genitourinary: Negative.   Musculoskeletal: Negative.   Neurological: Negative.   Endo/Heme/Allergies: Negative.   Psychiatric/Behavioral:  Positive for depression and suicidal ideas.    Blood pressure 112/80, pulse 78, temperature 98 F (36.7 C), temperature source Oral, resp. rate 16, SpO2 100%. There is no height or weight on file to calculate BMI.   Recommendations:  Medication regimen Continue Zyprexa  15 mg po at bedtime for schizoaffective  Continue Prozac  10 mg po daily for depression    Labs Urine pregnancy negative TSH within normal limits UDS positive for marijuana BAL negative CBC and CMP are remarkable  Disposition: Patient recommended for inpatient psychiatric  treatment for mood stabilization, medication management and safety for complaints of suicidal ideations and worsening depression. Patient is voluntary. Patient accepted to [facility].   Cymone Yeske L, NP 11/03/2023, 10:38 AM

## 2023-11-03 NOTE — ED Provider Notes (Addendum)
 [facility] Continuous Assessment Admission H&P  Date: 11/03/23 Patient Name: Jaclyn Owens MRN: [id number] Chief Complaint: suicidal ideation   Diagnoses:  Final diagnoses:  Schizoaffective disorder, bipolar type (HCC)    HPI: Jaclyn Owens is a 28 year old female with a history of schizoaffective disorder, bipolar type; generalized anxiety disorder; and marijuana abuse who presented voluntarily to [facility] with complaints of suicidal ideation with plan to overdose.   Patient seen face to face and her chart was reviewed by this NP. On assessment, she reports she has been experiencing worsening depressive symptoms for about 2 weeks and started having suicidal thoughts today. She identifies her children being placed in foster care as the main trigger. She endorses sadness, hopelessness, worthlessness, crying spells, social isolation, poor motivation, decreased appetite, and poor sleep. She reports having a plan to overdose and states she has access to the pills to do so. She reports a prior suicide attempt by cutting approximately two months ago but did not seek medical care at that time. She also endorses homicidal ideation toward "random people, just anybody." The patient reports auditory and visual hallucinations, including hearing and seeing spirits, shadows, and people. She admits to smoking approximately three marijuana blunts daily but denies use of other substances. She reports being followed by [facility] for medication management and has an upcoming appointment.   She says she is currently prescribed olanzapine  10 mg daily and fluoxetine  10 mg daily; however, per pharmacy records, these medications have not been filled since May 2025.  Patient is alert and oriented x4. She is cooperative. Speech is normal in rate. Mood is reported as "sad" and "hopeless," affect is congruent to stated mood. Thought processes are logical. Thought content is notable of active suicidal ideation with plan to  overdose as well as homicidal ideation toward unspecified individuals. She endorses auditory and visual hallucinations of spirits, shadows, and people. Insight and judgment are impaired. No abnormal involuntary movements or tics were observed.  Total Time spent with patient: 45 minutes  Musculoskeletal  Strength & Muscle Tone: within normal limits Gait & Station: normal Patient leans: Right  Psychiatric Specialty Exam  Presentation General Appearance:  Appropriate for Environment  Eye Contact: Good  Speech: Clear and Coherent  Speech Volume: Normal  Handedness: Right   Mood and Affect  Mood: Depressed (sad, hopeless)  Affect: Congruent   Thought Process  Thought Processes: Coherent  Descriptions of Associations:Intact  Orientation:Full (Time, Place and Person)  Thought Content:WDL  Diagnosis of Schizophrenia or Schizoaffective disorder in past: No  Duration of Psychotic Symptoms: Greater than six months  Hallucinations:Hallucinations: Visual; Auditory Description of Auditory Hallucinations: hearing command to hurt herself Description of Visual Hallucinations: spirits, shadow, people  Ideas of Reference:None  Suicidal Thoughts:Suicidal Thoughts: Yes, Active SI Active Intent and/or Plan: Without Intent; With Plan  Homicidal Thoughts:Homicidal Thoughts: Yes, Active   Sensorium  Memory: Immediate Fair; Remote Fair; Recent Fair  Judgment: Poor  Insight: Fair   Chartered certified accountant: Fair  Attention Span: Fair  Recall: [employer] of Knowledge: Fair  Language: Fair   Psychomotor Activity  Psychomotor Activity: Psychomotor Activity: Normal   Assets  Assets: Desire for Improvement; Communication Skills; Physical Health; Social Support   Sleep  Sleep: Sleep: Fair Number of Hours of Sleep: 5   Nutritional Assessment (For OBS and FBC admissions only) Has the patient had a weight loss or gain of 10 pounds or  more in the last 3 months?: No Has the patient had a decrease in  food intake/or appetite?: No Does the patient have dental problems?: No Does the patient have eating habits or behaviors that may be indicators of an eating disorder including binging or inducing vomiting?: No Has the patient recently lost weight without trying?: 0 Has the patient been eating poorly because of a decreased appetite?: 0 Malnutrition Screening Tool Score: 0    Physical Exam Vitals and nursing note reviewed.  Constitutional:      General: She is not in acute distress.    Appearance: She is well-developed.  HENT:     Head: Normocephalic and atraumatic.  Eyes:     Conjunctiva/sclera: Conjunctivae normal.  Cardiovascular:     Rate and Rhythm: Normal rate.  Pulmonary:     Effort: Pulmonary effort is normal.  Abdominal:     Palpations: Abdomen is soft.  Musculoskeletal:        General: Normal range of motion.     Cervical back: Normal range of motion.  Neurological:     Mental Status: She is alert and oriented to person, place, and time.  Psychiatric:        Attention and Perception: Attention normal. She perceives auditory and visual hallucinations.        Mood and Affect: Mood is anxious and depressed.        Speech: Speech normal.        Behavior: Behavior normal. Behavior is actively hallucinating. Behavior is cooperative.        Thought Content: Thought content includes suicidal ideation. Thought content includes suicidal plan.    Review of Systems  Constitutional: Negative.   HENT: Negative.    Eyes: Negative.   Respiratory: Negative.    Cardiovascular: Negative.   Gastrointestinal: Negative.   Genitourinary: Negative.   Musculoskeletal: Negative.   Skin: Negative.   Neurological: Negative.   Endo/Heme/Allergies: Negative.   Psychiatric/Behavioral:  Positive for depression, hallucinations, substance abuse and suicidal ideas. The patient is nervous/anxious.     Blood pressure 100/62,  pulse 71, temperature 98.4 F (36.9 C), temperature source Oral, resp. rate 18, SpO2 99%. There is no height or weight on file to calculate BMI.  Past Psychiatric History: chizoaffective disorder, bipolar type; generalized anxiety disorder; and marijuana abuse   Is the patient at risk to self? Yes  Has the patient been a risk to self in the past 6 months? Yes .    Has the patient been a risk to self within the distant past? Yes   Is the patient a risk to others? Yes   Has the patient been a risk to others in the past 6 months? No   Has the patient been a risk to others within the distant past? No   Past Medical History:  Past Medical History:  Diagnosis Date   Asthma    HSV (herpes simplex virus) infection      Family History:  Family History  Problem Relation Age of Onset   Asthma Mother    Diabetes Maternal Aunt    Cancer Maternal Grandmother      Social History:  Social History   Tobacco Use   Smoking status: Former    Types: Cigarettes, Cigars   Smokeless tobacco: Never  Vaping Use   Vaping status: Former  Substance Use Topics   Alcohol use: Not Currently    Comment: UTA, pt is not cooperative with triage questions 06/16/22   Drug use: Yes    Types: Marijuana     Last Labs:  Admission on 11/02/2023  Component Date Value Ref Range Status   WBC 11/03/2023 10.2  4.0 - 10.5 K/uL Final   RBC 11/03/2023 4.19  3.87 - 5.11 MIL/uL Final   Hemoglobin 11/03/2023 12.4  12.0 - 15.0 g/dL Final   HCT 89/94/7974 37.1  36.0 - 46.0 % Final   MCV 11/03/2023 88.5  80.0 - 100.0 fL Final   MCH 11/03/2023 29.6  26.0 - 34.0 pg Final   MCHC 11/03/2023 33.4  30.0 - 36.0 g/dL Final   RDW 89/94/7974 13.2  11.5 - 15.5 % Final   Platelets 11/03/2023 261  150 - 400 K/uL Final   nRBC 11/03/2023 0.0  0.0 - 0.2 % Final   Neutrophils Relative % 11/03/2023 55  % Final   Neutro Abs 11/03/2023 5.6  1.7 - 7.7 K/uL Final   Lymphocytes Relative 11/03/2023 35  % Final   Lymphs Abs 11/03/2023  3.6  0.7 - 4.0 K/uL Final   Monocytes Relative 11/03/2023 7  % Final   Monocytes Absolute 11/03/2023 0.7  0.1 - 1.0 K/uL Final   Eosinophils Relative 11/03/2023 2  % Final   Eosinophils Absolute 11/03/2023 0.2  0.0 - 0.5 K/uL Final   Basophils Relative 11/03/2023 1  % Final   Basophils Absolute 11/03/2023 0.1  0.0 - 0.1 K/uL Final   Immature Granulocytes 11/03/2023 0  % Final   Abs Immature Granulocytes 11/03/2023 0.03  0.00 - 0.07 K/uL Final   Performed at [facility], [address] [place], [place] [address]   Sodium 11/03/2023 138  135 - 145 mmol/L Final   Potassium 11/03/2023 3.8  3.5 - 5.1 mmol/L Final   Chloride 11/03/2023 103  98 - 111 mmol/L Final   CO2 11/03/2023 25  22 - 32 mmol/L Final   Glucose, Bld 11/03/2023 95  70 - 99 mg/dL Final   Glucose reference range applies only to samples taken after fasting for at least 8 hours.   BUN 11/03/2023 <5 (L)  6 - 20 mg/dL Final   Creatinine, Ser 11/03/2023 0.85  0.44 - 1.00 mg/dL Final   Calcium  11/03/2023 9.2  8.9 - 10.3 mg/dL Final   Total Protein 89/94/7974 7.2  6.5 - 8.1 g/dL Final   Albumin 89/94/7974 3.9  3.5 - 5.0 g/dL Final   AST 89/94/7974 19  15 - 41 U/L Final   ALT 11/03/2023 17  0 - 44 U/L Final   Alkaline Phosphatase 11/03/2023 20 (L)  38 - 126 U/L Final   Total Bilirubin 11/03/2023 0.7  0.0 - 1.2 mg/dL Final   GFR, Estimated 11/03/2023 >60  >60 mL/min Final   Comment: (NOTE) Calculated using the CKD-EPI Creatinine Equation (2021)    Anion gap 11/03/2023 10  5 - 15 Final   Performed at [facility], [address] [place], [place] [address]   Hgb A1c MFr Bld 11/03/2023 4.9  4.8 - 5.6 % Final   Comment: (NOTE) Diagnosis of Diabetes The following HbA1c ranges recommended by the American Diabetes Association (ADA) may be used as an aid in the diagnosis of diabetes mellitus.  Hemoglobin             Suggested A1C NGSP%              Diagnosis  <5.7                   Non Diabetic  5.7-6.4  Pre-Diabetic  >6.4                   Diabetic  <7.0                   Glycemic control for                       adults with diabetes.     Mean Plasma Glucose 11/03/2023 93.93  mg/dL Final   Performed at [facility], [address] [place], [place] [address]   Alcohol, Ethyl (B) 11/03/2023 <15  <15 mg/dL Final   Comment: (NOTE) For medical purposes only. Performed at [facility], [address] [place], [place] [address]    Cholesterol 11/03/2023 168  0 - 200 mg/dL Final   Triglycerides 89/94/7974 73  <150 mg/dL Final   HDL 89/94/7974 47  >40 mg/dL Final   Total CHOL/HDL Ratio 11/03/2023 3.6  RATIO Final   VLDL 11/03/2023 15  0 - 40 mg/dL Final   LDL Cholesterol 11/03/2023 106 (H)  0 - 99 mg/dL Final   Comment:        Total Cholesterol/HDL:CHD Risk Coronary Heart Disease Risk Table                     Men   Women  1/2 Average Risk   3.4   3.3  Average Risk       5.0   4.4  2 X Average Risk   9.6   7.1  3 X Average Risk  23.4   11.0        Use the calculated Patient Ratio above and the CHD Risk Table to determine the patient's CHD Risk.        ATP III CLASSIFICATION (LDL):  <100     mg/dL   Optimal  899-870  mg/dL   Near or Above                    Optimal  130-159  mg/dL   Borderline  839-810  mg/dL   High  >809     mg/dL   Very High Performed at [facility], [address], [place], [place] [address]    TSH 11/03/2023 2.610  0.350 - 4.500 uIU/mL Final   Comment: Performed by a 3rd Generation assay with a functional sensitivity of <=0.01 uIU/mL. Performed at [facility], [address] [place], [place] [address]    POC Amphetamine UR 11/03/2023 None Detected  NONE DETECTED (Cut Off Level 1000 ng/mL) Final   POC Secobarbital (BAR) 11/03/2023 None Detected  NONE DETECTED (Cut Off Level 300 ng/mL) Final   POC Buprenorphine (BUP) 11/03/2023 None Detected  NONE DETECTED (Cut Off Level 10 ng/mL) Final   POC Oxazepam (BZO) 11/03/2023 None  Detected  NONE DETECTED (Cut Off Level 300 ng/mL) Final   POC Cocaine UR 11/03/2023 None Detected  NONE DETECTED (Cut Off Level 300 ng/mL) Final   POC Methamphetamine UR 11/03/2023 None Detected  NONE DETECTED (Cut Off Level 1000 ng/mL) Final   POC Morphine 11/03/2023 None Detected  NONE DETECTED (Cut Off Level 300 ng/mL) Final   POC Methadone UR 11/03/2023 None Detected  NONE DETECTED (Cut Off Level 300 ng/mL) Final   POC Oxycodone  UR 11/03/2023 None Detected  NONE DETECTED (Cut Off Level 100 ng/mL) Final   POC Marijuana UR 11/03/2023 Positive (A)  NONE DETECTED (Cut Off Level  50 ng/mL) Final   Preg Test, Ur 11/03/2023 NEGATIVE  NEGATIVE Final   Comment:        THE SENSITIVITY OF THIS METHODOLOGY IS >20 mIU/mL.   Admission on 09/09/2023, Discharged on 09/09/2023  Component Date Value Ref Range Status   Neisseria Gonorrhea 09/09/2023 Negative   Final   Chlamydia 09/09/2023 Negative   Final   Trichomonas 09/09/2023 Negative   Final   Bacterial Vaginitis (gardnerella) 09/09/2023 Negative   Final   Candida Vaginitis 09/09/2023 Positive (A)   Final   Candida Glabrata 09/09/2023 Negative   Final   Comment 09/09/2023 Normal Reference Range Bacterial Vaginosis - Negative   Final   Comment 09/09/2023 Normal Reference Range Candida Species - Negative   Final   Comment 09/09/2023 Normal Reference Range Candida Galbrata - Negative   Final   Comment 09/09/2023 Normal Reference Range Trichomonas - Negative   Final   Comment 09/09/2023 Normal Reference Ranger Chlamydia - Negative   Final   Comment 09/09/2023 Normal Reference Range Neisseria Gonorrhea - Negative   Final   RPR Ser Ql 09/09/2023 Non Reactive  Non Reactive Final   HIV Screen 4th Generation wRfx 09/09/2023 Non Reactive  Non Reactive Final   Comment: HIV-1/HIV-2 antibodies and HIV-1 p24 antigen were NOT detected. There is no laboratory evidence of HIV infection. HIV Negative    Color, UA 09/09/2023 brown (A)  yellow Final   Clarity, UA  09/09/2023 hazy (A)  clear Final   Glucose, UA 09/09/2023 negative  negative mg/dL Final   Bilirubin, UA 91/88/7974 small (A)  negative Final   Ketones, POC UA 09/09/2023 small (15) (A)  negative mg/dL Final   Spec Grav, UA 91/88/7974 1.020  1.010 - 1.025 Final   Blood, UA 09/09/2023 negative  negative Final   pH, UA 09/09/2023 7.0  5.0 - 8.0 Final   POC PROTEIN,UA 09/09/2023 =30 (A)  negative, trace Final   Urobilinogen, UA 09/09/2023 1.0  0.2 or 1.0 E.U./dL Final   Nitrite, UA 91/88/7974 Positive (A)  Negative Final   Leukocytes, UA 09/09/2023 Trace (A)  Negative Final   Specimen Description 09/09/2023 URINE, CLEAN CATCH   Final   Special Requests 09/09/2023 NONE   Final   Culture 09/09/2023  (A)   Final                   Value:10,000 COLONIES/mL GROUP B STREP(S.AGALACTIAE)ISOLATED TESTING AGAINST S. AGALACTIAE NOT ROUTINELY PERFORMED DUE TO PREDICTABILITY OF AMP/PEN/VAN SUSCEPTIBILITY. Performed at [facility], [address], [place], [place] [address]    Report Status 09/09/2023 09/11/2023 FINAL   Final  Admission on 07/24/2023, Discharged on 07/24/2023  Component Date Value Ref Range Status   RPR Ser Ql 07/24/2023 Non Reactive  Non Reactive Final   Interpretation: 07/24/2023 Comment   Final   Comment: Syphilis: RPR with Reflex to RPR Titer and Treponemal           Antibodies, Traditional Screening and Diagnosis           Algorithm ------------------------------------------------------------                        Treponemal RPR        RPR, Qn         Ab       Final Interpretation --------   ---------   ----------   ------------------------ Non        N/A  N/A          No laboratory evidence Reactive                            of syphilis. Retest in                                     2-4 weeks if recent                                     exposure is suspected. --------   ---------   ----------   ------------------------ Reactive   >/=1:1      Non           Nontreponemal antibodies                        Reactive     detected. Syphilis                                     unlikely; biological                                     false positive possible.                                     Retest in 2-4 weeks if                                     recent exposure                           is                                     suspected. --------   ---------   ----------   ------------------------ Reactive   >/=1:1      Reactive     Treponemal and                                     nontreponemal antibodies                                     detected. Consistent                                     with past or current                                     (potential early)  syphilis.    HIV Screen 4th Generation wRfx 07/24/2023 Non Reactive  Non Reactive Final   Comment: HIV-1/HIV-2 antibodies and HIV-1 p24 antigen were NOT detected. There is no laboratory evidence of HIV infection. HIV Negative    Neisseria Gonorrhea 07/24/2023 Negative   Final   Chlamydia 07/24/2023 Negative   Final   Trichomonas 07/24/2023 Negative   Final   Bacterial Vaginitis (gardnerella) 07/24/2023 Positive (A)   Final   Candida Vaginitis 07/24/2023 Negative   Final   Candida Glabrata 07/24/2023 Negative   Final   Comment 07/24/2023 Normal Reference Range Bacterial Vaginosis - Negative   Final   Comment 07/24/2023 Normal Reference Ranger Chlamydia - Negative   Final   Comment 07/24/2023 Normal Reference Range Neisseria Gonorrhea - Negative   Final   Comment 07/24/2023 Normal Reference Range Candida Species - Negative   Final   Comment 07/24/2023 Normal Reference Range Candida Galbrata - Negative   Final   Comment 07/24/2023 Normal Reference Range Trichomonas - Negative   Final  Lab on 07/22/2023  Component Date Value Ref Range Status   Cholesterol, Total 07/22/2023 155  100 - 199 mg/dL Final   Triglycerides 93/76/7974 60  0 - 149  mg/dL Final   HDL 93/76/7974 43  >39 mg/dL Final   VLDL Cholesterol Cal 07/22/2023 12  5 - 40 mg/dL Final   LDL Chol Calc (NIH) 07/22/2023 100 (H)  0 - 99 mg/dL Final   Chol/HDL Ratio 07/22/2023 3.6  0.0 - 4.4 ratio Final   Comment:                                   T. Chol/HDL Ratio                                             Men  Women                               1/2 Avg.Risk  3.4    3.3                                   Avg.Risk  5.0    4.4                                2X Avg.Risk  9.6    7.1                                3X Avg.Risk 23.4   11.0    Hgb A1c MFr Bld 07/22/2023 5.3  4.8 - 5.6 % Final   Comment:          Prediabetes: 5.7 - 6.4          Diabetes: >6.4          Glycemic control for adults with diabetes: <7.0    Est. average glucose Bld gHb Est-m* 07/22/2023 105  mg/dL Final   Glucose 93/76/7974 99  70 - 99 mg/dL Final   BUN 93/76/7974 9  6 - 20 mg/dL Final   Creatinine, Ser 07/22/2023  0.78  0.57 - 1.00 mg/dL Final   eGFR 93/76/7974 107  >59 mL/min/1.73 Final   BUN/Creatinine Ratio 07/22/2023 12  9 - 23 Final   Sodium 07/22/2023 143  134 - 144 mmol/L Final   Potassium 07/22/2023 4.3  3.5 - 5.2 mmol/L Final   Chloride 07/22/2023 104  96 - 106 mmol/L Final   CO2 07/22/2023 19 (L)  20 - 29 mmol/L Final   Calcium  07/22/2023 9.2  8.7 - 10.2 mg/dL Final   Total Protein 93/76/7974 6.6  6.0 - 8.5 g/dL Final   Albumin 93/76/7974 4.3  4.0 - 5.0 g/dL Final   Globulin, Total 07/22/2023 2.3  1.5 - 4.5 g/dL Final   Bilirubin Total 07/22/2023 0.2  0.0 - 1.2 mg/dL Final   Alkaline Phosphatase 07/22/2023 27 (L)  44 - 121 IU/L Final   AST 07/22/2023 16  0 - 40 IU/L Final   ALT 07/22/2023 11  0 - 32 IU/L Final   WBC 07/22/2023 5.3  3.4 - 10.8 x10E3/uL Final   RBC 07/22/2023 4.17  3.77 - 5.28 x10E6/uL Final   Hemoglobin 07/22/2023 12.4  11.1 - 15.9 g/dL Final   Hematocrit 93/76/7974 38.6  34.0 - 46.6 % Final   MCV 07/22/2023 93  79 - 97 fL Final   MCH 07/22/2023 29.7  26.6 -  33.0 pg Final   MCHC 07/22/2023 32.1  31.5 - 35.7 g/dL Final   RDW 93/76/7974 12.9  11.7 - 15.4 % Final   Platelets 07/22/2023 272  150 - 450 x10E3/uL Final   Neutrophils 07/22/2023 47  Not Estab. % Final   Lymphs 07/22/2023 38  Not Estab. % Final   Monocytes 07/22/2023 10  Not Estab. % Final   Eos 07/22/2023 3  Not Estab. % Final   Basos 07/22/2023 1  Not Estab. % Final   Neutrophils Absolute 07/22/2023 2.6  1.4 - 7.0 x10E3/uL Final   Lymphocytes Absolute 07/22/2023 2.0  0.7 - 3.1 x10E3/uL Final   Monocytes Absolute 07/22/2023 0.5  0.1 - 0.9 x10E3/uL Final   EOS (ABSOLUTE) 07/22/2023 0.1  0.0 - 0.4 x10E3/uL Final   Basophils Absolute 07/22/2023 0.0  0.0 - 0.2 x10E3/uL Final   Immature Granulocytes 07/22/2023 0  Not Estab. % Final   Immature Grans (Abs) 07/22/2023 0.0  0.0 - 0.1 x10E3/uL Final    Allergies: Patient has no known allergies.  Medications:  Facility Ordered Medications  Medication   acetaminophen  (TYLENOL ) tablet 650 mg   alum & mag hydroxide-simeth (MAALOX/MYLANTA) 200-200-20 MG/5ML suspension 30 mL   magnesium  hydroxide (MILK OF MAGNESIA) suspension 30 mL   haloperidol  (HALDOL ) tablet 5 mg   And   diphenhydrAMINE  (BENADRYL ) capsule 50 mg   haloperidol  lactate (HALDOL ) injection 5 mg   And   diphenhydrAMINE  (BENADRYL ) injection 50 mg   And   LORazepam  (ATIVAN ) injection 2 mg   haloperidol  lactate (HALDOL ) injection 10 mg   And   diphenhydrAMINE  (BENADRYL ) injection 50 mg   And   LORazepam  (ATIVAN ) injection 2 mg   hydrOXYzine  (ATARAX ) tablet 25 mg   traZODone  (DESYREL ) tablet 50 mg   FLUoxetine  (PROZAC ) capsule 10 mg   OLANZapine  (ZYPREXA ) tablet 15 mg   PTA Medications  Medication Sig   OLANZapine  (ZYPREXA ) 10 MG tablet Take 1.5 tablets (15 mg total) by mouth at bedtime. You may take half a tablet (5 mg total) as needed.   FLUoxetine  (PROZAC ) 10 MG capsule Take 1 capsule (10 mg total) by mouth daily.   metroNIDAZOLE  (  FLAGYL ) 500 MG tablet Take 1 tablet  (500 mg total) by mouth 2 (two) times daily.   cephALEXin  (KEFLEX ) 500 MG capsule Take 1 capsule (500 mg total) by mouth 2 (two) times daily.   clotrimazole  (GYNE-LOTRIMIN ) 1 % vaginal cream Place 1 Applicatorful vaginally at bedtime.      Medical Decision Making  Recommend inpatient psychiatric admission for stabilization and safety monitoring Discuss restarting home medication for mangement of mood symptoms and psychosis and patient was agreeable.  Rstart: Olanzapine  10mg /day Fluoxetine  10mg /day  Recommendations  Based on my evaluation the patient does not appear to have an emergency medical condition.  Kathryne DELENA Show, NP 11/03/23  4:58 AM

## 2023-11-03 NOTE — Progress Notes (Signed)
 Admission Note: Patient is a 28 years old female admitted to the unit voluntarily from [facility] for suicidal ideation with plan to overdose on her medications, audiovisual hallucinations, depression, and homelessness.  Reports stressors as being homeless and her kids being placed in foster care.  Patient is alert and oriented x 4.  Patient presents with a flat, sad affect and depressed mood.  Stated goal is to get help with housing.  Admission plan of care reviewed, consent signed.  Skin and personal belongings completed.  Skin is dry and intact.  Items deemed contraband placed in the locker.  Oriented patient to the unit, staff and room.  Routine safety checks initiated.  Verbalizes understanding of unit rules/protocols.  Patient is safe on the unit.
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
## 2023-11-03 NOTE — Progress Notes (Signed)
 BHH/BMU LCSW Progress Note   11/03/2023    2:42 PM  Jaclyn Owens   [id number]   Type of Contact and Topic:  Psychiatric Bed Placement   Pt accepted to [facility] 501-1    Patient meets inpatient criteria per Jaclyn Show, NP    The attending provider will be Dr. Prentis  Call report to [phone]    Jaclyn Fireman, RN @ [facility] notified.     Pt scheduled  to arrive at [facility] TODAY.    Bunnie Gallop, MSW, LCSW-A  2:43 PM 11/03/2023

## 2023-11-04 ENCOUNTER — Encounter (HOSPITAL_COMMUNITY): Payer: Self-pay

## 2023-11-04 DIAGNOSIS — F1120 Opioid dependence, uncomplicated: Secondary | ICD-10-CM

## 2023-11-04 DIAGNOSIS — F1994 Other psychoactive substance use, unspecified with psychoactive substance-induced mood disorder: Secondary | ICD-10-CM

## 2023-11-04 DIAGNOSIS — F1990 Other psychoactive substance use, unspecified, uncomplicated: Secondary | ICD-10-CM | POA: Insufficient documentation

## 2023-11-04 DIAGNOSIS — F3132 Bipolar disorder, current episode depressed, moderate: Secondary | ICD-10-CM

## 2023-11-04 DIAGNOSIS — F1220 Cannabis dependence, uncomplicated: Secondary | ICD-10-CM

## 2023-11-04 MED ORDER — FLUOXETINE HCL 20 MG PO CAPS
20.0000 mg | ORAL_CAPSULE | Freq: Every day | ORAL | Status: DC
  Administered 2023-11-05 – 2023-11-06 (×2): 20 mg via ORAL
  Filled 2023-11-04 (×2): qty 1

## 2023-11-04 NOTE — Group Note (Signed)
 Date:  11/04/2023 Time:  3:06 PM  Group Topic/Focus:  Emotional Education:   The focus of this group is to discuss what feelings/emotions are, and how they are experienced. Physical Wellness Education: To educate participants on the principles of physical wellness, promote healthy lifestyle habits, and encourage regular physical activity to improve overall physical health, energy levels, and quality of life.   Participation Level:  Did Not Attend   Sherrel Shafer R Makilah Dowda 11/04/2023, 3:06 PM

## 2023-11-04 NOTE — BHH Counselor (Signed)
 CSW provided patient with contact number for [facility] Tailored Plan as she reported having interest in a care manager that could further assist her with housing and community resources. CSW provided patient with contact number for Member & Recipient services [phone]).  Jaclyn Owens, LCSWA 11/04/23 4:17PM

## 2023-11-04 NOTE — BH IP Treatment Plan (Signed)
 Interdisciplinary Treatment and Diagnostic Plan Update  11/04/2023 Time of Session: 10:55AM Jaclyn Owens MRN: [id number]  Principal Diagnosis: Bipolar disorder, unspecified (HCC)  Secondary Diagnoses: Principal Problem:   Bipolar disorder, unspecified (HCC) Active Problems:   Cannabis use disorder, severe, in controlled environment (HCC)   Opioid use disorder, severe, in controlled environment (HCC)   Substance induced mood disorder (HCC)   Illicit drug use   Current Medications:  Current Facility-Administered Medications  Medication Dose Route Frequency Provider Last Rate Last Admin   acetaminophen  (TYLENOL ) tablet 650 mg  650 mg Oral Q6H PRN White, Patrice L, NP       alum & mag hydroxide-simeth (MAALOX/MYLANTA) 200-200-20 MG/5ML suspension 30 mL  30 mL Oral Q4H PRN White, Patrice L, NP       haloperidol  (HALDOL ) tablet 5 mg  5 mg Oral TID PRN White, Patrice L, NP       And   diphenhydrAMINE  (BENADRYL ) capsule 50 mg  50 mg Oral TID PRN White, Patrice L, NP       haloperidol  lactate (HALDOL ) injection 5 mg  5 mg Intramuscular TID PRN White, Patrice L, NP       And   diphenhydrAMINE  (BENADRYL ) injection 50 mg  50 mg Intramuscular TID PRN White, Patrice L, NP       And   LORazepam  (ATIVAN ) injection 2 mg  2 mg Intramuscular TID PRN White, Patrice L, NP       haloperidol  lactate (HALDOL ) injection 10 mg  10 mg Intramuscular TID PRN White, Patrice L, NP       And   diphenhydrAMINE  (BENADRYL ) injection 50 mg  50 mg Intramuscular TID PRN White, Patrice L, NP       And   LORazepam  (ATIVAN ) injection 2 mg  2 mg Intramuscular TID PRN White, Patrice L, NP       [START ON 11/05/2023] FLUoxetine  (PROZAC ) capsule 20 mg  20 mg Oral Daily Jaclyn Clamp, MD       hydrOXYzine  (ATARAX ) tablet 25 mg  25 mg Oral TID PRN White, Patrice L, NP   25 mg at 11/03/23 2039   magnesium  hydroxide (MILK OF MAGNESIA) suspension 30 mL  30 mL Oral Daily PRN White, Patrice L, NP       OLANZapine  (ZYPREXA )  tablet 15 mg  15 mg Oral QHS White, Patrice L, NP   15 mg at 11/03/23 2039   PTA Medications: Medications Prior to Admission  Medication Sig Dispense Refill Last Dose/Taking   FLUoxetine  (PROZAC ) 10 MG capsule Take 1 capsule (10 mg total) by mouth daily. 30 capsule 2    OLANZapine  (ZYPREXA ) 10 MG tablet Take 1.5 tablets (15 mg total) by mouth at bedtime. You may take half a tablet (5 mg total) as needed. 60 tablet 2     Patient Stressors: Financial difficulties   Marital or family conflict    Patient Strengths: Ability for insight  Capable of independent living  Motivation for treatment/growth   Treatment Modalities: Medication Management, Group therapy, Case management,  1 to 1 session with clinician, Psychoeducation, Recreational therapy.   Physician Treatment Plan for Primary Diagnosis: Bipolar disorder, unspecified (HCC) Long Term Goal(s):     Short Term Goals:    Medication Management: Evaluate patient's response, side effects, and tolerance of medication regimen.  Therapeutic Interventions: 1 to 1 sessions, Unit Group sessions and Medication administration.  Evaluation of Outcomes: Not Progressing  Physician Treatment Plan for Secondary Diagnosis: Principal Problem:   Bipolar disorder, unspecified (HCC)  Active Problems:   Cannabis use disorder, severe, in controlled environment (HCC)   Opioid use disorder, severe, in controlled environment (HCC)   Substance induced mood disorder (HCC)   Illicit drug use  Long Term Goal(s):     Short Term Goals:       Medication Management: Evaluate patient's response, side effects, and tolerance of medication regimen.  Therapeutic Interventions: 1 to 1 sessions, Unit Group sessions and Medication administration.  Evaluation of Outcomes: Not Progressing   RN Treatment Plan for Primary Diagnosis: Bipolar disorder, unspecified (HCC) Long Term Goal(s): Knowledge of disease and therapeutic regimen to maintain health will  improve  Short Term Goals: Ability to remain free from injury will improve, Ability to verbalize frustration and anger appropriately will improve, Ability to demonstrate self-control, Ability to participate in decision making will improve, Ability to verbalize feelings will improve, Ability to disclose and discuss suicidal ideas, and Compliance with prescribed medications will improve  Medication Management: RN will administer medications as ordered by provider, will assess and evaluate patient's response and provide education to patient for prescribed medication. RN will report any adverse and/or side effects to prescribing provider.  Therapeutic Interventions: 1 on 1 counseling sessions, Psychoeducation, Medication administration, Evaluate responses to treatment, Monitor vital signs and CBGs as ordered, Perform/monitor CIWA, COWS, AIMS and Fall Risk screenings as ordered, Perform wound care treatments as ordered.  Evaluation of Outcomes: Not Progressing   LCSW Treatment Plan for Primary Diagnosis: Bipolar disorder, unspecified (HCC) Long Term Goal(s): Safe transition to appropriate next level of care at discharge, Engage patient in therapeutic group addressing interpersonal concerns.  Short Term Goals: Engage patient in aftercare planning with referrals and resources, Increase social support, Increase ability to appropriately verbalize feelings, Increase emotional regulation, Facilitate acceptance of mental health diagnosis and concerns, and Increase skills for wellness and recovery  Therapeutic Interventions: Assess for all discharge needs, 1 to 1 time with Social worker, Explore available resources and support systems, Assess for adequacy in community support network, Educate family and significant other(s) on suicide prevention, Complete Psychosocial Assessment, Interpersonal group therapy.  Evaluation of Outcomes: Not Progressing   Progress in Treatment: Attending groups:  No. Participating in groups: No. Taking medication as prescribed: Yes. Toleration medication: Yes. Family/Significant other contact made: No, will contact:  [name] (grandmother) [phone] Patient understands diagnosis: Yes. Discussing patient identified problems/goals with staff: Yes. Medical problems stabilized or resolved: Yes. Denies suicidal/homicidal ideation: Yes. Issues/concerns per patient self-inventory: No. Patient Goals:  to get some help with housing  Discharge Plan or Barriers: Patient likely to discharge to shelter once stable.   Reason for Continuation of Hospitalization: Depression Medication stabilization  Estimated Length of Stay: 3-5 days  Last 3 [place] Suicide Severity Risk Score: Flowsheet Row Ad[facility] ([facility] from 09/09/2023 in [facility])  C-SSRS RISK CATEGORY High Risk High Risk No Risk    Last PHQ 2/9 Scores:    09/26/2017    2:42 PM  Depression screen PHQ 2/9  Decreased Interest 0  Down, Depressed, Hopeless 0  PHQ - 2 Score 0  Altered sleeping 1  Tired, decreased energy 1  Change in appetite 0  Feeling bad or failure about yourself  0  Trouble concentrating 0  Moving slowly or fidgety/restless 0  Suicidal thoughts 0  PHQ-9 Score 2  Difficult doing work/chores Not difficult at all  Scribe for Treatment Team: Joelys Staubs M Stella Bortle, ISRAEL 11/04/2023 4:09 PM

## 2023-11-04 NOTE — BHH Group Notes (Signed)
 BHH Group Notes:  (Nursing/MHT/Case Management/Adjunct)  Date:  11/04/2023  Time:  8:53 PM  Type of Therapy:  Wrap-up group  Participation Level:  Did Not Attend  Participation Quality:    Affect:    Cognitive:    Insight:    Engagement in Group:    Modes of Intervention:    Summary of Progress/Problems:PT refused to attend group.  [name] 11/04/2023, 8:53 PM

## 2023-11-04 NOTE — Progress Notes (Signed)
   11/04/23 0846  Psych Admission Type (Psych Patients Only)  Admission Status Voluntary  Psychosocial Assessment  Patient Complaints Depression  Eye Contact Brief  Facial Expression Flat  Affect Sad  Speech Logical/coherent  Interaction Assertive  Motor Activity Slow  Appearance/Hygiene In scrubs  Behavior Characteristics Agitated  Mood Depressed  Thought Process  Coherency WDL  Content WDL  Delusions None reported or observed  Perception Hallucinations  Hallucination Auditory;Visual  Judgment Poor  Confusion None  Danger to Self  Current suicidal ideation? Passive  Description of Suicide Plan no plan  Self-Injurious Behavior Some self-injurious ideation observed or expressed.  No lethal plan expressed   Agreement Not to Harm Self Yes  Description of Agreement verbal  Danger to Others  Danger to Others None reported or observed

## 2023-11-04 NOTE — Plan of Care (Signed)
   Problem: Education: Goal: Knowledge of [place] [facility] Education information/materials will improve Outcome: Progressing Goal: Emotional status will improve Outcome: Progressing Goal: Mental status will improve Outcome: Progressing Goal: Verbalization of understanding the information provided will improve Outcome: Progressing
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
## 2023-11-04 NOTE — Plan of Care (Signed)
   Problem: Education: Goal: Emotional status will improve Outcome: Progressing Goal: Mental status will improve Outcome: Progressing Goal: Verbalization of understanding the information provided will improve Outcome: Progressing

## 2023-11-04 NOTE — Progress Notes (Signed)
 Conversation with patient  Patient accepted inpatient substance use resources, however didn't want referrals to be sent to [facility] or [facility].   Keelen Quevedo, LCSWA 11/04/2023

## 2023-11-04 NOTE — H&P (Addendum)
 Psychiatric Admission Assessment Adult  Patient Identification: Jaclyn Owens MRN:  [id number] Date of Evaluation:  11/04/2023 Chief Complaint:  Suicidal thoughts Principal Diagnosis: Substance induced mood disorder (HCC) Diagnosis:  Principal Problem:   Substance induced mood disorder (HCC) Active Problems:   Bipolar disorder, unspecified (HCC)   Cannabis use disorder, severe, in controlled environment (HCC)   Opioid use disorder, severe, in controlled environment [facility])   Illicit drug use  Jaclyn Owens is a 28 year old female admitted for suicidal ideation. She has previous psychiatric history significant for schizoaffective disorder and severe bipolar disorder with recent IVC hospitalization within the last year.  The patient has been in prison this year, reportedly for assault on a police officer.  History of marijuana abuse.  She has a medical history significant for gestational diabetes, HSV-2 and ongoing cannabis and opioid use.  She is currently admitted to the [facility] on a voluntary basis.  History of Present Illness: On initial evaluation this morning, the patient demonstrates a linear and logical thought process.  She is somewhat irritable but is amenable to the full interview.  She does not appear psychotic, not responding internal stimuli.  Patient states she is currently unhoused and has been living out of her vehicle since being released from prison. She states depressive symptoms have worsened due to her current living situation and her children entering foster care. She describes her mood as irritable, like she doesn't want to be bothered by anyone, and depressed. She states sleep,appetite and energy have been ok, but have been decreased for some time; of note she also reports unintended 5 lb weight loss in the past month. She also reports that she doesn't get along with other females, and doesn't like staying at shelters because she often gets into  physical altercations with other women. She endorses passive SI, noting she would be better off dead, but denies any specific thoughts, plans or intent of self-harm. She also denies any symptoms of mania, or homicidal ideation.  Patient does report experiencing visual hallucinations of shadows as well as auditory hallucinations that she does not want to specify any further.  She reports a history of trauma and reports recurring unwanted memories, nightmares, and feeling as thought she's reliving traumatic experiences.   She states she is currently being treated for schizophrenia, bipolar disorder, depression and anxiety, and is seeing a therapist and psychiatrist at [facility]. She reports adhering to prozac  and zyprexa  regimen until sometime last week due to running out of medications, but is unable to specify exactly when. She also endorses ongoing cannabis and percocet use, for which she is seeking treatment. She reports experiencing withdrawal symptoms attributed to cannabis. She is requesting assistance with medication refills and housing stability.   Pertinent social history includes the following: She has 3 children who have been placed into foster care following her incarceration. She states she was incarcerated 8 months for trespassing onto her mother's property and also a physical altercation with police officers. She reports finding some social support in her grandmother, but otherwise has no significant relationships. She previously had an apartment though public housing and was employed at [employer], but has been unhoused since being released from prison and is currently living out of her vehicle which she prefers to sleeping in shelters to avoid conflict. She would like to apply for disability.    Total Time spent with patient: 45 min  Past Psychiatric History: as above  Is the patient at risk to self?  No Has the patient been a risk to self in the past 6 months? No Has the patient been a  risk to self within the distant past? Yes Is the patient a risk to others? No Has the patient been a risk to others in the past 6 months? Yes Has the patient been a risk to others within the distant past? Yes  [place] Scale:  Flowsheet Row Ad[facility] ([facility]) [facility] from 11/02/2023 in [facility] from 09/09/2023 in [facility])  C-SSRS RISK CATEGORY High Risk High Risk No Risk     Prior Inpatient Therapy:  Prior Outpatient Therapy:   Alcohol Screening:  Patient refused Alcohol Screening Tool: Yes 1. How often do you have a drink containing alcohol?: Never 2. How many drinks containing alcohol do you have on a typical day when you are drinking?: 1 or 2 3. How often do you have six or more drinks on one occasion?: Never AUDIT-C Score: 0 4. How often during the last year have you found that you were not able to stop drinking once you had started?: Never 5. How often during the last year have you failed to do what was normally expected from you because of drinking?: Never 6. How often during the last year have you needed a first drink in the morning to get yourself going after a heavy drinking session?: Never 7. How often during the last year have you had a feeling of guilt of remorse after drinking?: Never 8. How often during the last year have you been unable to remember what happened the night before because you had been drinking?: Never 9. Have you or someone else been injured as a result of your drinking?: No 10. Has a relative or friend or a doctor or another health worker been concerned about your drinking or suggested you cut down?: No Alcohol Use Disorder Identification Test Final Score (AUDIT): 0 Alcohol Brief Interventions/Follow-up: Patient Refused Substance Abuse History in the last 12 months:  Consequences of Substance Abuse:  Previous  Psychotropic Medications:  Psychological Evaluations:   Past Medical History:  Past Medical History:  Diagnosis Date   Asthma    HSV (herpes simplex virus) infection     Past Surgical History:  Procedure Laterality Date   ARM WOUND REPAIR / CLOSURE     Family History:  Family History  Problem Relation Age of Onset   Asthma Mother    Diabetes Maternal Aunt    Cancer Maternal Grandmother    Family Psychiatric  History: none pertinent Tobacco Screening:  Social History   Tobacco Use  Smoking Status Former   Types: Cigarettes, Cigars  Smokeless Tobacco Never    BH Tobacco Counseling     Are you interested in Tobacco Cessation Medications?  N/A, patient does not use tobacco products Counseled patient on smoking cessation:  N/A, patient does not use tobacco products Reason Tobacco Screening Not Completed: Patient Refused Screening       Social History:  Social History   Substance and Sexual Activity  Alcohol Use Not Currently   Comment: UTA, pt is not cooperative with triage questions 06/16/22     Social History   Substance and Sexual Activity  Drug Use Yes   Types: Marijuana    Additional Social History: She has 3 children who have been placed into foster care following her incarceration. She states she was incarcerated 8 months  for trespassing onto her mother's property and also a physical altercation with police officers. She reports finding some social support in her grandmother, but otherwise has no significant relationships. She previously had an apartment though public housing and was employed at [employer], but has been unhoused since being released from prison and is currently living out of her vehicle which she prefers to sleeping in shelters to avoid conflict. She would like to apply for disability.   Allergies:   No Known Allergies Lab Results:  Results for orders placed or performed during the hospital encounter of 11/02/23 (from the past 48 hours)  CBC  with Differential/Platelet     Status: None   Collection Time: 11/03/23 12:51 AM  Result Value Ref Range   WBC 10.2 4.0 - 10.5 K/uL   RBC 4.19 3.87 - 5.11 MIL/uL   Hemoglobin 12.4 12.0 - 15.0 g/dL   HCT 62.8 63.9 - 53.9 %   MCV 88.5 80.0 - 100.0 fL   MCH 29.6 26.0 - 34.0 pg   MCHC 33.4 30.0 - 36.0 g/dL   RDW 86.7 88.4 - 84.4 %   Platelets 261 150 - 400 K/uL   nRBC 0.0 0.0 - 0.2 %   Neutrophils Relative % 55 %   Neutro Abs 5.6 1.7 - 7.7 K/uL   Lymphocytes Relative 35 %   Lymphs Abs 3.6 0.7 - 4.0 K/uL   Monocytes Relative 7 %   Monocytes Absolute 0.7 0.1 - 1.0 K/uL   Eosinophils Relative 2 %   Eosinophils Absolute 0.2 0.0 - 0.5 K/uL   Basophils Relative 1 %   Basophils Absolute 0.1 0.0 - 0.1 K/uL   Immature Granulocytes 0 %   Abs Immature Granulocytes 0.03 0.00 - 0.07 K/uL    Comment: Performed at [facility] Lab, [address] [place], [place] [address]  Comprehensive metabolic panel     Status: Abnormal   Collection Time: 11/03/23 12:51 AM  Result Value Ref Range   Sodium 138 135 - 145 mmol/L   Potassium 3.8 3.5 - 5.1 mmol/L   Chloride 103 98 - 111 mmol/L   CO2 25 22 - 32 mmol/L   Glucose, Bld 95 70 - 99 mg/dL    Comment: Glucose reference range applies only to samples taken after fasting for at least 8 hours.   BUN <5 (L) 6 - 20 mg/dL   Creatinine, Ser 9.14 0.44 - 1.00 mg/dL   Calcium  9.2 8.9 - 10.3 mg/dL   Total Protein 7.2 6.5 - 8.1 g/dL   Albumin 3.9 3.5 - 5.0 g/dL   AST 19 15 - 41 U/L   ALT 17 0 - 44 U/L   Alkaline Phosphatase 20 (L) 38 - 126 U/L   Total Bilirubin 0.7 0.0 - 1.2 mg/dL   GFR, Estimated >39 >39 mL/min    Comment: (NOTE) Calculated using the CKD-EPI Creatinine Equation (2021)    Anion gap 10 5 - 15    Comment: Performed at [facility] Lab, [address], [place], [place] [address]  Hemoglobin A1c     Status: None   Collection Time: 11/03/23 12:51 AM  Result Value Ref Range   Hgb A1c MFr Bld 4.9 4.8 - 5.6 %    Comment: (NOTE) Diagnosis  of Diabetes The following HbA1c ranges recommended by the American Diabetes Association (ADA) may be used as an aid in the diagnosis of diabetes mellitus.  Hemoglobin  Suggested A1C NGSP%              Diagnosis  <5.7                   Non Diabetic  5.7-6.4                Pre-Diabetic  >6.4                   Diabetic  <7.0                   Glycemic control for                       adults with diabetes.     Mean Plasma Glucose 93.93 mg/dL    Comment: Performed at [facility], [address], [place], [place] [address]  Ethanol     Status: None   Collection Time: 11/03/23 12:51 AM  Result Value Ref Range   Alcohol, Ethyl (B) <15 <15 mg/dL    Comment: (NOTE) For medical purposes only. Performed at [facility], [address] [place], [place] [address]   Lipid panel     Status: Abnormal   Collection Time: 11/03/23 12:51 AM  Result Value Ref Range   Cholesterol 168 0 - 200 mg/dL   Triglycerides 73 <849 mg/dL   HDL 47 >59 mg/dL   Total CHOL/HDL Ratio 3.6 RATIO   VLDL 15 0 - 40 mg/dL   LDL Cholesterol 893 (H) 0 - 99 mg/dL    Comment:        Total Cholesterol/HDL:CHD Risk Coronary Heart Disease Risk Table                     Men   Women  1/2 Average Risk   3.4   3.3  Average Risk       5.0   4.4  2 X Average Risk   9.6   7.1  3 X Average Risk  23.4   11.0        Use the calculated Patient Ratio above and the CHD Risk Table to determine the patient's CHD Risk.        ATP III CLASSIFICATION (LDL):  <100     mg/dL   Optimal  899-870  mg/dL   Near or Above                    Optimal  130-159  mg/dL   Borderline  839-810  mg/dL   High  >809     mg/dL   Very High Performed at [facility], [address], [place], [place] [address]   TSH     Status: None   Collection Time: 11/03/23 12:51 AM  Result Value Ref Range   TSH 2.610 0.350 - 4.500 uIU/mL    Comment: Performed by a 3rd Generation assay with a functional sensitivity of <=0.01  uIU/mL. Performed at [facility] Lab, [address], [place], [place] [address]   POCT Urine Drug Screen - (I-Screen)     Status: Abnormal   Collection Time: 11/03/23 12:53 AM  Result Value Ref Range   POC Amphetamine UR None Detected NONE DETECTED (Cut Off Level 1000 ng/mL)   POC Secobarbital (BAR) None Detected NONE DETECTED (Cut Off Level 300 ng/mL)   POC Buprenorphine (BUP) None Detected NONE DETECTED (Cut Off Level 10 ng/mL)   POC Oxazepam (  BZO) None Detected NONE DETECTED (Cut Off Level 300 ng/mL)   POC Cocaine UR None Detected NONE DETECTED (Cut Off Level 300 ng/mL)   POC Methamphetamine UR None Detected NONE DETECTED (Cut Off Level 1000 ng/mL)   POC Morphine None Detected NONE DETECTED (Cut Off Level 300 ng/mL)   POC Methadone UR None Detected NONE DETECTED (Cut Off Level 300 ng/mL)   POC Oxycodone  UR None Detected NONE DETECTED (Cut Off Level 100 ng/mL)   POC Marijuana UR Positive (A) NONE DETECTED (Cut Off Level 50 ng/mL)  Pregnancy, urine POC     Status: None   Collection Time: 11/03/23 12:54 AM  Result Value Ref Range   Preg Test, Ur NEGATIVE NEGATIVE    Comment:        THE SENSITIVITY OF THIS METHODOLOGY IS >20 mIU/mL.   SARS Coronavirus 2 by RT PCR (hospital order, performed in [facility] hospital lab) *cepheid single result test* Anterior Nasal Swab     Status: None   Collection Time: 11/03/23 10:58 AM   Specimen: Anterior Nasal Swab  Result Value Ref Range   SARS Coronavirus 2 by RT PCR NEGATIVE NEGATIVE    Comment: Performed at [facility] Lab, [address] [place], [place] [address]    Blood Alcohol level:  Lab Results  Component Value Date   [facility] <15 11/03/2023   ETH <10 10/24/2022    Metabolic Disorder Labs:  Lab Results  Component Value Date   HGBA1C 4.9 11/03/2023   MPG 93.93 11/03/2023   MPG 117 06/27/2022   No results found for: PROLACTIN Lab Results  Component Value Date   CHOL 168 11/03/2023   TRIG 73 11/03/2023   HDL 47  11/03/2023   CHOLHDL 3.6 11/03/2023   VLDL 15 11/03/2023   LDLCALC 106 (H) 11/03/2023   LDLCALC 100 (H) 07/22/2023    Current Medications: Current Facility-Administered Medications  Medication Dose Route Frequency Provider Last Rate Last Admin   acetaminophen  (TYLENOL ) tablet 650 mg  650 mg Oral Q6H PRN White, Patrice L, NP       alum & mag hydroxide-simeth (MAALOX/MYLANTA) 200-200-20 MG/5ML suspension 30 mL  30 mL Oral Q4H PRN White, Patrice L, NP       haloperidol  (HALDOL ) tablet 5 mg  5 mg Oral TID PRN White, Patrice L, NP       And   diphenhydrAMINE  (BENADRYL ) capsule 50 mg  50 mg Oral TID PRN White, Patrice L, NP       haloperidol  lactate (HALDOL ) injection 5 mg  5 mg Intramuscular TID PRN White, Patrice L, NP       And   diphenhydrAMINE  (BENADRYL ) injection 50 mg  50 mg Intramuscular TID PRN White, Patrice L, NP       And   LORazepam  (ATIVAN ) injection 2 mg  2 mg Intramuscular TID PRN White, Patrice L, NP       haloperidol  lactate (HALDOL ) injection 10 mg  10 mg Intramuscular TID PRN White, Patrice L, NP       And   diphenhydrAMINE  (BENADRYL ) injection 50 mg  50 mg Intramuscular TID PRN White, Patrice L, NP       And   LORazepam  (ATIVAN ) injection 2 mg  2 mg Intramuscular TID PRN White, Patrice L, NP       FLUoxetine  (PROZAC ) capsule 10 mg  10 mg Oral Daily White, Patrice L, NP   10 mg at 11/04/23 0846   hydrOXYzine  (ATARAX ) tablet 25 mg  25 mg  Oral TID PRN Teresa Jes L, NP   25 mg at 11/03/23 2039   magnesium  hydroxide (MILK OF MAGNESIA) suspension 30 mL  30 mL Oral Daily PRN White, Patrice L, NP       OLANZapine  (ZYPREXA ) tablet 15 mg  15 mg Oral QHS White, Patrice L, NP   15 mg at 11/03/23 2039   PTA Medications: Medications Prior to Admission  Medication Sig Dispense Refill Last Dose/Taking   FLUoxetine  (PROZAC ) 10 MG capsule Take 1 capsule (10 mg total) by mouth daily. 30 capsule 2    OLANZapine  (ZYPREXA ) 10 MG tablet Take 1.5 tablets (15 mg total) by mouth at  bedtime. You may take half a tablet (5 mg total) as needed. 60 tablet 2      Psychiatric Specialty Exam: Physical Exam Constitutional:      General: She is not in acute distress. HENT:     Head: Normocephalic and atraumatic.  Pulmonary:     Effort: Pulmonary effort is normal.  Skin:    General: Skin is dry.  Neurological:     Mental Status: She is alert and oriented to person, place, and time.  Psychiatric:        Attention and Perception: Attention normal. She does not perceive auditory or visual hallucinations.        Mood and Affect: Affect normal. Mood is depressed.        Speech: Speech normal.        Behavior: Behavior is withdrawn. Behavior is cooperative.        Thought Content: Thought content is not delusional. Thought content does not include homicidal or suicidal ideation. Thought content does not include homicidal or suicidal plan.        Cognition and Memory: Cognition and memory normal.        Judgment: Judgment normal.     Review of Systems  Constitutional:  Positive for unexpected weight change. Negative for activity change, appetite change, diaphoresis and fever.       States she lost about 5 lbs in the last month without trying to  Respiratory:  Negative for shortness of breath.   Gastrointestinal: Negative.        Denied any digestive issues  Psychiatric/Behavioral:         Reports seeing shadows a couple days ago.    Blood pressure 107/67, pulse 70, temperature 98.7 F (37.1 C), temperature source Oral, resp. rate 16, height 5' 7 (1.702 m), weight 80.7 kg, SpO2 100%.Body mass index is 27.88 kg/m.  General Appearance: Black female, sitting up for interview, in no acute distress. Casual, Fairly Groomed, Guarded, and appears stated age.   Eye Contact:  Minimal  Speech:  Clear and Coherent and Normal Rate  Volume:  Normal  Mood:  Depressed and Irritable  Affect:  Appropriate, Congruent, Depressed, and Restricted  Thought Process:  Coherent, Linear, and  Descriptions of Associations: Intact  Orientation:  Full (Time, Place, and Person)  Thought Content:  WDL, Logical, and Denies AVH, delusions  Suicidal Thoughts: No thoughts of self-harm or suicide, does report occasional passive thoughts of death  Homicidal Thoughts:  No  Memory:  Immediate;   Fair Recent;   Fair  Judgement:  Fair  Insight:  Present  Psychomotor Activity:  Normal  Concentration:  Concentration: Fair and Attention Span: Fair  Recall:  [employer] of Knowledge:  NA  Language:  Fair  Akathisia:  No  Handed:  NA  AIMS (if indicated):  Assets:  Electrical engineer Others:  Desire to improve  ADL's:  Intact  Cognition:  WNL  Sleep:   OK    Treatment Plan Summary: Daily contact with patient to assess and evaluate symptoms and progress in treatment and Medication management.   Physician Treatment Plan for Primary Diagnosis: Substance induced mood disorder (HCC) Long Term Goal(s): Improvement in symptoms so as ready for discharge  Short Term Goals: Ability to verbalize feelings will improve; ability to identify changes in lifestyle to reduce recurrence of condition will improve, find placement in residential rehabilitation program  Physician Treatment Plan for Secondary Diagnosis: Principal Problem:   Substance induced mood disorder (HCC) Active Problems:   Bipolar disorder, unspecified (HCC)   Cannabis use disorder, severe, in controlled environment (HCC)   Opioid use disorder, severe, in controlled environment [facility])   Illicit drug use    ASSESSMENT:  Jaclyn Owens is a 29 year old black female admitted for suicidal ideation. She has a genetic predisposition to psychiatric illness evidenced by schizophrenia and bipolar disorder in her mother and sister. She has previous psychiatric history significant for schizoaffective disorder and severe bipolar disorder with recent IVC hospitalization and incarceration within the last year,  and reports sub-optimal adherence to medication, as well as ongoing cannabis and opioid use. She has significant social stressors included being separated from her children, financial insecurity, experiencing homelessness and poor social support.  She has an extensive documented history of psychosis and mood disorders. She appears organized in thought and mildly dysphoric, reporting a depressed mood inconsistent with negative symptoms of schizophrenia. She does not appear energetic to be responding to internal stimuli and denies any active psychosis or mania symptoms (no AVH, delusions, elevated mood, energy, goal-directed activity, grandiosity). She is currently endorsing symptoms consistent with DSM-V criteria for MDD including depressed mood, decreased sleep, energy and appetite with weight loss, as well as suicidality. There are no obvious medical issues which would explain symptoms  (normal TSH, serum electrolytes). However, in the setting of ongoing significant cannabis use (previously reported 3 blunts/day), most likely diagnosis is substance-induced mood disorder. Of note, there was a documented hospitalization for acute psychosis on 10/24/22 with a negative UDS. During that hospitalization, she was diagnosed with severe manic bipolar 1 disorder with psychotic behavior. Mood and psychotic symptoms improve with Seroquel  and Zyprexa . Currently, this is less likely as she does not currently appear manic/hypomanic. Her history of trauma, intrusive thoughts, recurrent nightmares, and recurrent physical altercations and reckless self-destructive behavior could also be explained by PTSD and warrants further evaluation, although this is also confounded by ongoing substance use. HSV encephalitis is also possible with a history of HSV-2, but more commonly associated with HSV-1 and would present acutely along with systemic symptoms. Less likely conditions, but also in the differential include cluster B personality  disorders, intermittent explosive disorder.   Diagnoses / Active Problems:  Principal Problem:   Drug-induced mood disorder (HCC) Active Problems:   Schizoaffective disorder (HCC)   Severe tetrahydrocannabinol (THC) dependence (HCC)   Opiate abuse, continuous (HCC)   PLAN: Safety and Monitoring:  -- Voluntary admission to inpatient psychiatric unit for safety, stabilization and treatment  -- Daily contact with patient to assess and evaluate symptoms and progress in treatment  -- Patient's case to be discussed in multi-disciplinary team meeting  -- Observation Level : q15 minute checks  -- Vital signs:  q12 hours  -- Precautions: suicide, elopement, and assault  2. Psychiatric Diagnoses and Treatment:   ##  Drug-induced mood disorder (HCC)  # Schizoaffective disorder  # Severe tetrahydrocannabinol (THC) dependence  # Opiate abuse, continuous -- Continue olanzapine  (Zyprexa ) 15 mg -- Increase fluoxetine  (Prozac ) to 20 mg -- Move off 500 hall, no recent violent behaviors  -- PRN haloperidol , diphenhydramine  and lorazepam  for agitation -- Consider placing on COWS protocol if withdrawal symptoms develop -- Consult SW regarding placement in substance use rehabilitation facility  3. Medical Issues Being Addressed:   11/03/23 Labs reviewed: UDS positive for cannabis, pregnancy screen negative, CBC, CMP, HbA1C, lipid panel are all WNL and otherwise unremarkable.   4. Discharge Planning:   -- Social work and case management to assist with discharge planning and identification of hospital follow-up needs prior to discharge  -- Estimated LOS: 5-7 days  -- Discharge Concerns: Need to establish a safety plan; Medication compliance and effectiveness  -- Discharge Goals: Return home with outpatient referrals for mental health follow-up including medication management/psychotherapy   I certify that inpatient services furnished can reasonably be expected to improve the patient's condition.     Abel A. Cena Champion, Medical Student  I personally was present and performed or re-performed the history, physical exam and medical decision-making activities of this service and have verified that the service and findings are accurately documented in the student's note.  Karleen Kaufmann, MD PGY-4

## 2023-11-04 NOTE — Progress Notes (Signed)
   11/04/23 2000  Psych Admission Type (Psych Patients Only)  Admission Status Voluntary  Psychosocial Assessment  Patient Complaints Depression  Eye Contact Brief  Facial Expression Flat  Affect Depressed  Speech Logical/coherent  Interaction Assertive  Motor Activity Slow  Appearance/Hygiene In scrubs  Behavior Characteristics Cooperative;Calm  Mood Depressed  Thought Process  Coherency WDL  Content WDL  Delusions None reported or observed  Perception WDL  Hallucination None reported or observed  Judgment Poor  Confusion None  Danger to Self  Current suicidal ideation? Denies  Agreement Not to Harm Self Yes  Description of Agreement Verbal  Danger to Others  Danger to Others None reported or observed

## 2023-11-04 NOTE — Progress Notes (Signed)
(  Sleep Hours) - 6.75 (Any PRNs that were needed, meds refused, or side effects to meds)- PRN vistaril  25 mg given, no meds refused.  (Any disturbances and when (visitation, over night)- None  (Concerns raised by the patient)- None  (SI/HI/AVH)- Denies SI/HI/AVH

## 2023-11-04 NOTE — Progress Notes (Signed)
 Handoff report given to Joaquin Doing, RN

## 2023-11-04 NOTE — BHH Suicide Risk Assessment (Signed)
 [facility] Admission Suicide Risk Assessment   Nursing information obtained from:  Patient Demographic factors:  Low socioeconomic status, Unemployed Current Mental Status:  Self-harm thoughts Loss Factors:  Financial problems / change in socioeconomic status Historical Factors:  NA Risk Reduction Factors:  NA  Total Time spent with patient: 45 min Principal Problem: Substance induced mood disorder (HCC) Diagnosis:  Principal Problem:   Substance induced mood disorder (HCC) Active Problems:   Bipolar disorder, unspecified (HCC)   Cannabis use disorder, severe, in controlled environment (HCC)   Opioid use disorder, severe, in controlled environment (HCC)   Illicit drug use   Subjective Data:  Jaclyn Owens is a 28 year old female admitted for suicidal ideation. She has previous psychiatric history significant for schizoaffective disorder and severe bipolar disorder with recent IVC hospitalization within the last year.  The patient has been in prison this year, reportedly for assault on a police officer.  History of marijuana abuse.  She has a medical history significant for gestational diabetes, HSV-2 and ongoing cannabis and opioid use.  She is currently admitted to the [facility] on a voluntary basis.     Continued Clinical Symptoms:  Alcohol Use Disorder Identification Test Final Score (AUDIT): 0 The Alcohol Use Disorders Identification Test, Guidelines for Use in Primary Care, Second Edition.  World Science writer [facility]). Score between 0-7:  no or low risk or alcohol related problems. Score between 8-15:  moderate risk of alcohol related problems. Score between 16-19:  high risk of alcohol related problems. Score 20 or above:  warrants further diagnostic evaluation for alcohol dependence and treatment.   CLINICAL FACTORS:   Depression:   Hopelessness  Psychiatric Specialty Exam: Physical Exam Constitutional:      Appearance: the patient is not  toxic-appearing.  Pulmonary:     Effort: Pulmonary effort is normal.  Neurological:     General: No focal deficit present.     Mental Status: the patient is alert and oriented to person, place, and time.   Review of Systems  Respiratory:  Negative for shortness of breath.   Cardiovascular:  Negative for chest pain.  Gastrointestinal:  Negative for abdominal pain, constipation, diarrhea, nausea and vomiting.  Neurological:  Negative for headaches.      BP 107/67 (BP Location: Right Arm)   Pulse 70   Temp 98.7 F (37.1 C) (Oral)   Resp 16   Ht 5' 7 (1.702 m)   Wt 80.7 kg   SpO2 100%   BMI 27.88 kg/m   General Appearance: Fairly Groomed  Eye Contact:  Good  Speech:  Clear and Coherent  Volume:  Normal  Mood:  sad  Affect:  Congruent  Thought Process:  Coherent  Orientation:  Full (Time, Place, and Person)  Thought Content: Logical   Suicidal Thoughts:  No  Homicidal Thoughts:  No  Memory:  Immediate;   Good  Judgement:  poor  Insight:  poor  Psychomotor Activity:  Normal  Concentration:  Concentration: Good  Recall:  Good  Fund of Knowledge: Good  Language: Good  Akathisia:  No  Handed:  not assessed  AIMS (if indicated): not done  Assets:  Communication Skills Desire for Improvement Financial Resources/Insurance Housing Leisure Time Physical Health  ADL's:  Intact  Cognition: WNL  Sleep:  Fair      COGNITIVE FEATURES THAT CONTRIBUTE TO RISK:  None    SUICIDE RISK:  Moderate: Frequent suicidal ideation with limited intensity, and duration, some specificity in terms of  plans, no associated intent, good self-control, limited dysphoria/symptomatology, some risk factors present, and identifiable protective factors, including available and accessible social support.  PLAN OF CARE: See H and P  I certify that inpatient services furnished can reasonably be expected to improve the patient's condition.   Karleen Kaufmann, MD PGY-4

## 2023-11-04 NOTE — Progress Notes (Signed)
   11/03/23 2039  Psych Admission Type (Psych Patients Only)  Admission Status Voluntary  Psychosocial Assessment  Patient Complaints Depression  Eye Contact Fair  Facial Expression Flat  Affect Sad  Speech Logical/coherent  Interaction Assertive  Motor Activity Slow  Appearance/Hygiene In scrubs  Behavior Characteristics Cooperative  Mood Depressed  Thought Process  Coherency WDL  Content WDL  Delusions None reported or observed  Perception Hallucinations  Hallucination None reported or observed  Judgment Poor  Confusion None  Danger to Self  Current suicidal ideation? Denies  Agreement Not to Harm Self Yes  Description of Agreement Verbal  Danger to Others  Danger to Others None reported or observed

## 2023-11-04 NOTE — Plan of Care (Signed)
  Problem: Coping: Goal: Ability to verbalize frustrations and anger appropriately will improve Outcome: Progressing   Problem: Physical Regulation: Goal: Ability to maintain clinical measurements within normal limits will improve Outcome: Progressing   Problem: Medication: Goal: Compliance with prescribed medication regimen will improve Outcome: Progressing

## 2023-11-04 NOTE — Progress Notes (Deleted)
   11/04/23 0846  Psych Admission Type (Psych Patients Only)  Admission Status Voluntary  Psychosocial Assessment  Patient Complaints Depression  Eye Contact Brief  Facial Expression Flat  Affect Sad  Speech Logical/coherent  Interaction Assertive  Motor Activity Slow  Appearance/Hygiene In scrubs  Behavior Characteristics Agitated  Mood Depressed  Thought Process  Coherency WDL  Content WDL  Delusions None reported or observed  Perception Hallucinations  Hallucination Auditory;Visual  Judgment Poor  Confusion None  Danger to Self  Current suicidal ideation? Denies  Agreement Not to Harm Self Yes  Description of Agreement verbal  Danger to Others  Danger to Others None reported or observed

## 2023-11-05 MED ORDER — TRAZODONE HCL 50 MG PO TABS
50.0000 mg | ORAL_TABLET | Freq: Every day | ORAL | Status: DC
  Administered 2023-11-06: 50 mg via ORAL
  Filled 2023-11-05: qty 1

## 2023-11-05 MED ORDER — NICOTINE POLACRILEX 2 MG MT GUM
2.0000 mg | CHEWING_GUM | OROMUCOSAL | Status: DC | PRN
  Administered 2023-11-05: 2 mg via ORAL
  Filled 2023-11-05: qty 1

## 2023-11-05 NOTE — BHH Suicide Risk Assessment (Signed)
 BHH INPATIENT:  Family/Significant Other Suicide Prevention Education  Suicide Prevention Education:  Education Completed; [name] (grandmother) [phone],  (name of family member/significant other) has been identified by the patient as the family member/significant other with whom the patient will be residing, and identified as the person(s) who will aid the patient in the event of a mental health crisis (suicidal ideations/suicide attempt).  With written consent from the patient, the family member/significant other has been provided the following suicide prevention education, prior to the and/or following the discharge of the patient.  Grandmother said that she will pick up patient tomorrow, Wednesday, 10/8 at 11 AM.    There are no guns or weapons in the home, patient doesn't have access to guns or weapons.  Grandmother doesn't have any concerns about patient being discharged.  The suicide prevention education provided includes the following: Suicide risk factors Suicide prevention and interventions National Suicide Hotline telephone number [facility] assessment telephone number [facility] Emergency Assistance 911 [facility] and/or Residential Mobile Crisis Unit telephone number  Request made of family/significant other to: Remove weapons (e.g., guns, rifles, knives), all items previously/currently identified as safety concern.   Remove drugs/medications (over-the-counter, prescriptions, illicit drugs), all items previously/currently identified as a safety concern.  The family member/significant other verbalizes understanding of the suicide prevention education information provided.  The family member/significant other agrees to remove the items of safety concern listed above.  Digna Countess O Tyjanae Bartek, LCSWA 11/05/2023, 1:49 PM

## 2023-11-05 NOTE — Progress Notes (Signed)
 [facility] MD Progress Note  11/05/2023 12:51 PM Jaclyn Owens  MRN:  [id number]  Principal Problem: Bipolar disorder, unspecified (HCC) Diagnosis: Principal Problem:   Bipolar disorder, unspecified (HCC) Active Problems:   Cannabis use disorder, severe, in controlled environment (HCC)   Opioid use disorder, severe, in controlled environment [facility])   Substance induced mood disorder (HCC)   Illicit drug use    Reason for Admission:  Jaclyn Owens is a 28 year old female admitted for suicidal ideation. She has previous psychiatric history significant for schizoaffective disorder and severe bipolar disorder with recent IVC hospitalization within the last year.  The patient has been in prison this year, reportedly for assault on a police officer.  History of marijuana abuse.  She has a medical history significant for gestational diabetes, HSV-2 and ongoing cannabis and opioid use.  She is currently admitted to the [facility] on a voluntary basis.    Information obtained from 24-hour nursing report: No remarkable events   Information Obtained Today During Patient Interview:  The patient begins the interview by stating I just want to go home.  She says that she needs to return to her job at [employer].  She says that she plans to live in her car for the time being while she tries to get housing.  She says that she wants to stay abstinent from marijuana and Percocets.  She does not appear to have a good plan in place for this.  We discussed briefly some strategies she could use.  She reports good sleep and appetite.  She reports an improved mood.  She denies experiencing thoughts of self-harm.  We discussed potential discharge for tomorrow.  Total Time spent with patient: 20 min  Past Psychiatric History: as per H and P  Past Medical History:  Past Medical History:  Diagnosis Date   Asthma    HSV (herpes simplex virus) infection     Past Surgical History:  Procedure  Laterality Date   ARM WOUND REPAIR / CLOSURE     Family History:  Family History  Problem Relation Age of Onset   Asthma Mother    Diabetes Maternal Aunt    Cancer Maternal Grandmother    Family Psychiatric  History: as per H and P Social History:  Social History   Substance and Sexual Activity  Alcohol Use Not Currently   Comment: UTA, pt is not cooperative with triage questions 06/16/22     Social History   Substance and Sexual Activity  Drug Use Yes   Types: Marijuana    Social History   Socioeconomic History   Marital status: Single    Spouse name: Not on file   Number of children: 1   Years of education: 13   Highest education level: High school graduate  Occupational History   Not on file  Tobacco Use   Smoking status: Former    Types: Cigarettes, Cigars   Smokeless tobacco: Never  Vaping Use   Vaping status: Former  Substance and Sexual Activity   Alcohol use: Not Currently    Comment: UTA, pt is not cooperative with triage questions 06/16/22   Drug use: Yes    Types: Marijuana   Sexual activity: Yes    Birth control/protection: None  Other Topics Concern   Not on file  Social History Narrative   Not on file   Social Drivers of Health   Financial Resource Strain: Low Risk  (01/10/2018)   Overall Financial Resource Strain (CARDIA)  Difficulty of Paying Living Expenses: Not very hard  Food Insecurity: Food Insecurity Present (11/03/2023)   Hunger Vital Sign    Worried About Running Out of Food in the Last Year: Often true    Ran Out of Food in the Last Year: Often true  Transportation Needs: No Transportation Needs (11/03/2023)   PRAPARE - Administrator, Civil Service (Medical): No    Lack of Transportation (Non-Medical): No  Physical Activity: Inactive (01/10/2018)   Exercise Vital Sign    Days of Exercise per Week: 0 days    Minutes of Exercise per Session: 0 min  Stress: No Stress Concern Present (01/10/2018)   [employer]  of Occupational Health - Occupational Stress Questionnaire    Feeling of Stress : Not at all  Social Connections: Unknown (11/03/2023)   Social Connection and Isolation Panel    Frequency of Communication with Friends and Family: Patient declined    Frequency of Social Gatherings with Friends and Family: Patient declined    Attends Religious Services: Patient declined    Database administrator or Organizations: No    Attends Engineer, structural: Patient declined    Marital Status: Patient declined   Additional Social History:                         Sleep: fair  Appetite: fair  Current Medications: Current Facility-Administered Medications  Medication Dose Route Frequency Provider Last Rate Last Admin   acetaminophen  (TYLENOL ) tablet 650 mg  650 mg Oral Q6H PRN White, Patrice L, NP       alum & mag hydroxide-simeth (MAALOX/MYLANTA) 200-200-20 MG/5ML suspension 30 mL  30 mL Oral Q4H PRN White, Patrice L, NP       haloperidol  (HALDOL ) tablet 5 mg  5 mg Oral TID PRN White, Patrice L, NP       And   diphenhydrAMINE  (BENADRYL ) capsule 50 mg  50 mg Oral TID PRN White, Patrice L, NP       haloperidol  lactate (HALDOL ) injection 5 mg  5 mg Intramuscular TID PRN White, Patrice L, NP       And   diphenhydrAMINE  (BENADRYL ) injection 50 mg  50 mg Intramuscular TID PRN White, Patrice L, NP       And   LORazepam  (ATIVAN ) injection 2 mg  2 mg Intramuscular TID PRN White, Patrice L, NP       haloperidol  lactate (HALDOL ) injection 10 mg  10 mg Intramuscular TID PRN White, Patrice L, NP       And   diphenhydrAMINE  (BENADRYL ) injection 50 mg  50 mg Intramuscular TID PRN White, Patrice L, NP       And   LORazepam  (ATIVAN ) injection 2 mg  2 mg Intramuscular TID PRN White, Patrice L, NP       FLUoxetine  (PROZAC ) capsule 20 mg  20 mg Oral Daily Marry Clamp, MD   20 mg at 11/05/23 9193   hydrOXYzine  (ATARAX ) tablet 25 mg  25 mg Oral TID PRN White, Patrice L, NP   25 mg at 11/04/23  2101   magnesium  hydroxide (MILK OF MAGNESIA) suspension 30 mL  30 mL Oral Daily PRN White, Patrice L, NP       nicotine  polacrilex (NICORETTE) gum 2 mg  2 mg Oral PRN Marry Clamp, MD       OLANZapine  (ZYPREXA ) tablet 15 mg  15 mg Oral QHS White, Patrice L, NP   15 mg  at 11/04/23 2101    Lab Results:  No results found for this or any previous visit (from the past 48 hours).  Blood Alcohol level:  Lab Results  Component Value Date   [facility] <15 11/03/2023   ETH <10 10/24/2022    Metabolic Disorder Labs: Lab Results  Component Value Date   HGBA1C 4.9 11/03/2023   MPG 93.93 11/03/2023   MPG 117 06/27/2022   No results found for: PROLACTIN Lab Results  Component Value Date   CHOL 168 11/03/2023   TRIG 73 11/03/2023   HDL 47 11/03/2023   CHOLHDL 3.6 11/03/2023   VLDL 15 11/03/2023   LDLCALC 106 (H) 11/03/2023   LDLCALC 100 (H) 07/22/2023    Physical Findings:   Psychiatric Specialty Exam: Physical Exam Constitutional:      Appearance: the patient is not toxic-appearing.  Pulmonary:     Effort: Pulmonary effort is normal.  Neurological:     General: No focal deficit present.     Mental Status: the patient is alert and oriented to person, place, and time.   Review of Systems  Respiratory:  Negative for shortness of breath.   Cardiovascular:  Negative for chest pain.  Gastrointestinal:  Negative for abdominal pain, constipation, diarrhea, nausea and vomiting.  Neurological:  Negative for headaches.      BP 105/72 (BP Location: Right Arm)   Pulse 88   Temp 98.1 F (36.7 C) (Oral)   Resp 18   Ht 5' 7 (1.702 m)   Wt 80.7 kg   SpO2 99%   BMI 27.88 kg/m   General Appearance: Fairly Groomed  Eye Contact:  Good  Speech:  Clear and Coherent  Volume:  Normal  Mood:  Euthymic  Affect:  Congruent  Thought Process:  Coherent  Orientation:  Full (Time, Place, and Person)  Thought Content: Logical   Suicidal Thoughts:  No  Homicidal Thoughts:  No  Memory:   Immediate;   Good  Judgement:  fair  Insight:  fair  Psychomotor Activity:  Normal  Concentration:  Concentration: Good  Recall:  Good  Fund of Knowledge: Good  Language: Good  Akathisia:  No  Handed:  not assessed  AIMS (if indicated): not done  Assets:  Communication Skills Desire for Improvement Financial Resources/Insurance Housing Leisure Time Physical Health  ADL's:  Intact  Cognition: WNL  Sleep:  Fair      Treatment Plan Summary: Daily contact with patient to assess and evaluate symptoms and progress in treatment and Medication management  Diagnoses / Active Problems:   Principal Problem:   Drug-induced mood disorder (HCC) Active Problems:   Schizoaffective disorder (HCC)   Severe tetrahydrocannabinol (THC) dependence (HCC)   Opiate abuse, continuous (HCC)     PLAN: Safety and Monitoring:             -- Voluntary admission to inpatient psychiatric unit for safety, stabilization and treatment             -- Daily contact with patient to assess and evaluate symptoms and progress in treatment             -- Patient's case to be discussed in multi-disciplinary team meeting             -- Observation Level : q15 minute checks             -- Vital signs:  q12 hours             -- Precautions: suicide, elopement, and  assault   2. Psychiatric Diagnoses and Treatment:   ## Drug-induced mood disorder (HCC)  # Schizoaffective disorder  # Severe tetrahydrocannabinol (THC) dependence  # Opiate abuse, continuous -- Continue olanzapine  (Zyprexa ) 15 mg -- Continue Prozac  20 mg daily -- Move off 500 hall, no recent violent behaviors             -- PRN haloperidol , diphenhydramine  and lorazepam  for agitation -- Consider placing on COWS protocol if withdrawal symptoms develop -- Consult SW regarding placement in substance use rehabilitation facility   3. Medical Issues Being Addressed:              11/03/23 Labs reviewed: UDS positive for cannabis, pregnancy screen  negative, CBC, CMP, HbA1C, lipid panel are all WNL and otherwise unremarkable.     4. Discharge Planning:              -- Social work and case management to assist with discharge planning and identification of hospital follow-up needs prior to discharge             -- Estimated LOS: 5-7 days             -- Discharge Concerns: Need to establish a safety plan; Medication compliance and effectiveness             -- Discharge Goals: Return home with outpatient referrals for mental health follow-up including medication management/psychotherapy      Karleen Kaufmann, MD PGY-4

## 2023-11-05 NOTE — Progress Notes (Signed)
(  Sleep Hours) - 12.5 (Any PRNs that were needed, meds refused, or side effects to meds)- PRN vistaril  25 mg given at pt request, no meds refused.  (Any disturbances and when (visitation, over night)- None  (Concerns raised by the patient)- None  (SI/HI/AVH)- Denies SI/HI/AVH

## 2023-11-05 NOTE — Group Note (Signed)
 Date:  11/05/2023 Time:  1:40 PM  Group Topic/Focus:  Recovery Goals:   The focus of this group is to identify appropriate goals for recovery and establish a plan to achieve them. [employer]: To understand individualized and supportive care plans that promote mental health recovery, enhance coping skills, and improve overall well-being, while fostering a safe and inclusive environment for all participants.   Participation Level:  Did Not Attend   Jaclyn Owens 11/05/2023, 1:40 PM

## 2023-11-05 NOTE — Group Note (Signed)
 Recreation Therapy Group Note   Group Topic:Animal Assisted Therapy   Group Date: 11/05/2023 Start Time: 9050 End Time: 1030 Facilitators: Candis Kabel-McCall, LRT,CTRS Location: [address] Dayroom   Animal-Assisted Activity (AAA) Program Checklist/Progress Notes Patient Eligibility Criteria Checklist & Daily Group note for Rec Tx Intervention  AAA/T Program Assumption of Risk Form signed by Patient/ or Parent Legal Guardian Yes  Patient understands his/her participation is voluntary Yes  Behavioral Response:    Education: Charity fundraiser, Appropriate Animal Interaction   Education Outcome: Acknowledges education.    Affect/Mood: N/A   Participation Level: Did not attend    Clinical Observations/Individualized Feedback:      Plan: Continue to engage patient in RT group sessions 2-3x/week.   Jaclyn Owens, LRT,CTRS 11/05/2023 12:33 PM

## 2023-11-05 NOTE — Progress Notes (Signed)
   11/05/23 0925  Psych Admission Type (Psych Patients Only)  Admission Status Voluntary  Psychosocial Assessment  Patient Complaints Depression  Eye Contact Brief  Facial Expression Flat  Affect Sad  Speech Logical/coherent  Interaction Assertive  Motor Activity Slow  Appearance/Hygiene In scrubs  Behavior Characteristics Cooperative  Mood Depressed  Thought Process  Coherency WDL  Content WDL  Delusions None reported or observed  Perception WDL  Hallucination None reported or observed  Judgment Poor  Confusion None  Danger to Self  Current suicidal ideation? Denies  Agreement Not to Harm Self Yes  Description of Agreement Verbal  Danger to Others  Danger to Others None reported or observed

## 2023-11-05 NOTE — Plan of Care (Signed)
  Problem: Activity: Goal: Interest or engagement in activities will improve Outcome: Progressing Goal: Sleeping patterns will improve Outcome: Progressing   Problem: Coping: Goal: Ability to verbalize frustrations and anger appropriately will improve Outcome: Progressing Goal: Ability to demonstrate self-control will improve Outcome: Progressing   Problem: Safety: Goal: Periods of time without injury will increase Outcome: Progressing

## 2023-11-05 NOTE — Progress Notes (Signed)
   11/05/23 2200  Psych Admission Type (Psych Patients Only)  Admission Status Voluntary  Psychosocial Assessment  Patient Complaints Depression  Eye Contact Brief  Facial Expression Flat  Affect Depressed  Speech Logical/coherent  Interaction Assertive  Motor Activity Slow  Appearance/Hygiene In scrubs  Behavior Characteristics Cooperative  Mood Depressed  Thought Process  Coherency WDL  Content WDL  Delusions None reported or observed  Perception WDL  Hallucination None reported or observed  Judgment Poor  Confusion None  Danger to Self  Current suicidal ideation? Denies  Agreement Not to Harm Self Yes  Description of Agreement Verbal  Danger to Others  Danger to Others None reported or observed

## 2023-11-05 NOTE — Group Note (Signed)
 Date:  11/05/2023 Time:  9:55 AM  Group Topic/Focus:  Goals Group:   The focus of this group is to help patients establish daily goals to achieve during treatment and discuss how the patient can incorporate goal setting into their daily lives to aide in recovery.    Participation Level:  Did Not Attend  Jaclyn Owens 11/05/2023, 9:55 AM

## 2023-11-05 NOTE — Progress Notes (Signed)
 Collateral contact - [facility], Disability Assistance Program / SOAR program [address] [place], [place] [address], [phone]    CSW emailed the referral via secure email to [facility] [email or web address], [email or web address] and [email or web address]    Karna confirmed that the referral has been received.   Uziel Covault, LCSWA 11/05/2023

## 2023-11-05 NOTE — Group Note (Signed)
 LCSW Group Therapy Note   Group Date: 11/05/2023 Start Time: 1100 End Time: 1200   Participation:  did not attend  Type of Therapy:  Group Therapy  Topic:  Stress Less:  Nurturing Your Mind and Body Through Calm    Objective:  Learn techniques for managing stress through body relaxation, mindfulness, and self-compassion.  Goals: Use body relaxation techniques, such as Box Breathing and Progressive Muscle Relaxation, to reduce physical tension. Practice mindfulness to break the cycle of overthinking and mental chatter. Embrace self-compassion to handle stress with kindness and resilience.  Summary:  Today's session focused on calming the body with relaxation techniques, breaking the cycle of stress with mindfulness, and using self-compassion to manage challenges more gracefully. These tools help reduce stress and foster a balanced, peaceful mindset.  Therapeutic Modalities used:  Elements of CBT ( cognitive restructuring)  Elements of DBT (box breathing, progressive body relaxation, mindfulness, acceptance)    Ilma Achee O Starlene Consuegra, LCSWA 11/05/2023  4:54 PM

## 2023-11-05 NOTE — Group Note (Signed)
 Date:  11/05/2023 Time:  5:26 PM  Group Topic/Focus:  Dimensions of Wellness:   The focus of this group is to introduce the topic of wellness and discuss the role each dimension of wellness plays in total health.    Participation Level:  Did Not Attend   Annalee Larch 11/05/2023, 5:26 PM

## 2023-11-06 DIAGNOSIS — F3130 Bipolar disorder, current episode depressed, mild or moderate severity, unspecified: Secondary | ICD-10-CM

## 2023-11-06 DIAGNOSIS — F319 Bipolar disorder, unspecified: Principal | ICD-10-CM

## 2023-11-06 MED ORDER — OLANZAPINE 15 MG PO TABS
15.0000 mg | ORAL_TABLET | Freq: Every day | ORAL | 0 refills | Status: AC
Start: 2023-11-06 — End: ?

## 2023-11-06 MED ORDER — NICOTINE POLACRILEX 2 MG MT GUM: 2.0000 mg | CHEWING_GUM | OROMUCOSAL | 0 refills | Status: AC | PRN

## 2023-11-06 MED ORDER — HYDROXYZINE HCL 25 MG PO TABS: 25.0000 mg | ORAL_TABLET | Freq: Three times a day (TID) | ORAL | 0 refills | Status: AC | PRN

## 2023-11-06 MED ORDER — TRAZODONE HCL 50 MG PO TABS: 50.0000 mg | ORAL_TABLET | Freq: Every day | ORAL | 0 refills | Status: AC

## 2023-11-06 MED ORDER — FLUOXETINE HCL 20 MG PO CAPS: 20.0000 mg | ORAL_CAPSULE | Freq: Every day | ORAL | 0 refills | Status: AC

## 2023-11-06 NOTE — Group Note (Signed)
 Date:  11/06/2023 Time:  9:40 AM  Group Topic/Focus:  Emotional Education:   The focus of this group is to discuss what feelings/emotions are, and how they are experienced. Goals Group:   The focus of this group is to help patients establish daily goals to achieve during treatment and discuss how the patient can incorporate goal setting into their daily lives to aide in recovery. Orientation:   The focus of this group is to educate the patient on the purpose and policies of crisis stabilization and provide a format to answer questions about their admission.  The group details unit policies and expectations of patients while admitted.    Participation Level:  Did Not Attend   Jaclyn Owens 11/06/2023, 9:40 AM

## 2023-11-06 NOTE — Discharge Summary (Signed)
 Physician Discharge Summary Note  Patient:  Jaclyn Owens is an 28 y.o., female MRN:  [id number] DOB:  10/17/1995 Patient phone:  [phone] (home)  Patient address:   [address] [place] [address],  Total Time spent with patient: 15 min  Date of Admission:  11/03/2023 Date of Discharge: 11/06/2023  Reason for Admission:   Jaclyn Owens is a 28 year old female admitted for suicidal ideation. She has previous psychiatric history significant for schizoaffective disorder and severe bipolar disorder with recent IVC hospitalization within the last year.  The patient has been in prison this year, reportedly for assault on a police officer.  History of marijuana abuse.  She has a medical history significant for gestational diabetes, HSV-2 and ongoing cannabis and opioid use.  She is currently admitted to the [facility] on a voluntary basis.     Principal Problem: Bipolar disorder, unspecified (HCC) Discharge Diagnoses: Principal Problem:   Bipolar disorder, unspecified (HCC) Active Problems:   Cannabis use disorder, severe, in controlled environment (HCC)   Opioid use disorder, severe, in controlled environment (HCC)   Substance induced mood disorder (HCC)   Illicit drug use    Past Psychiatric History: See H and P  Past Medical History:  Past Medical History:  Diagnosis Date   Asthma    HSV (herpes simplex virus) infection     Past Surgical History:  Procedure Laterality Date   ARM WOUND REPAIR / CLOSURE     Family History:  Family History  Problem Relation Age of Onset   Asthma Mother    Diabetes Maternal Aunt    Cancer Maternal Grandmother    Family Psychiatric  History: See H and P Social History:  Social History   Substance and Sexual Activity  Alcohol Use Not Currently   Comment: UTA, pt is not cooperative with triage questions 06/16/22     Social History   Substance and Sexual Activity  Drug Use Yes   Types: Marijuana     Social History   Socioeconomic History   Marital status: Single    Spouse name: Not on file   Number of children: 1   Years of education: 13   Highest education level: High school graduate  Occupational History   Not on file  Tobacco Use   Smoking status: Former    Types: Cigarettes, Cigars   Smokeless tobacco: Never  Vaping Use   Vaping status: Former  Substance and Sexual Activity   Alcohol use: Not Currently    Comment: UTA, pt is not cooperative with triage questions 06/16/22   Drug use: Yes    Types: Marijuana   Sexual activity: Yes    Birth control/protection: None  Other Topics Concern   Not on file  Social History Narrative   Not on file   Social Drivers of Health   Financial Resource Strain: Low Risk  (01/10/2018)   Overall Financial Resource Strain (CARDIA)    Difficulty of Paying Living Expenses: Not very hard  Food Insecurity: Food Insecurity Present (11/03/2023)   Hunger Vital Sign    Worried About Running Out of Food in the Last Year: Often true    Ran Out of Food in the Last Year: Often true  Transportation Needs: No Transportation Needs (11/03/2023)   PRAPARE - Administrator, Civil Service (Medical): No    Lack of Transportation (Non-Medical): No  Physical Activity: Inactive (01/10/2018)   Exercise Vital Sign    Days of Exercise  per Week: 0 days    Minutes of Exercise per Session: 0 min  Stress: No Stress Concern Present (01/10/2018)   [employer] of Occupational Health - Occupational Stress Questionnaire    Feeling of Stress : Not at all  Social Connections: Unknown (11/03/2023)   Social Connection and Isolation Panel    Frequency of Communication with Friends and Family: Patient declined    Frequency of Social Gatherings with Friends and Family: Patient declined    Attends Religious Services: Patient declined    Database administrator or Organizations: No    Attends Engineer, structural: Patient declined    Marital  Status: Patient declined    Hospital Course:   During the patient's hospitalization, patient had extensive initial psychiatric evaluation, and follow-up psychiatric evaluations every day.   Upon evaluation, psychiatric diagnoses were given as follows:  ## Drug-induced mood disorder (HCC)  # Unspecified bipolar disorder  # Severe tetrahydrocannabinol (THC) dependence  # Opiate abuse, continuous   Patient's psychiatric medications were adjusted on admission:  Restart Zyprexa  15 mg nightly Restart Prozac  10 mg daily Patient had been off of these medications for about a week prior to hospitalization   During the hospitalization, other adjustments were made to the patient's psychiatric medication regimen:  Increase Prozac  to 20 mg daily   Gradually, patient started adjusting to milieu.   Patient's care was discussed during the interdisciplinary team meeting every day during the hospitalization.   The patient denied having side effects to prescribed psychiatric medication.   The patient reports their target psychiatric symptoms responded well to the psychiatric medications, and the patient reports overall benefit other psychiatric hospitalization. Supportive psychotherapy was provided to the patient. The patient also participated in regular group therapy while admitted.    Labs were reviewed with the patient, and abnormal results were discussed with the patient.   The patient denied having suicidal thoughts more than 48 hours prior to discharge.  Patient denies having homicidal thoughts.  Patient denies having auditory hallucinations.  Patient denies any visual hallucinations.  Patient denies having paranoid thoughts.   The patient is able to verbalize their individual safety plan to this provider.   It is recommended to the patient to continue psychiatric medications as prescribed, after discharge from the hospital.     It is recommended to the patient to follow up with your outpatient  psychiatric provider and PCP.   Discussed with the patient, the impact of alcohol, drugs, tobacco have been there overall psychiatric and medical wellbeing, and total abstinence from substance use was recommended the patient.    During the course of his hospitalization, the 15-minute checks were adequate to ensure patient's safety. The patient did not display any dangerous, violent or suicidal behavior on the unit.  The patient interacted with patients & staff appropriately, participated appropriately in the group sessions/therapies. The patient's medications were addressed & adjusted to meet his needs. The patient was recommended for outpatient follow-up care & medication management upon discharge to assure continuity of care & mood stability.    Today upon their discharge evaluation with the attending psychiatrist, the patient shares they are doing well and that they feel ready for discharge. The patient denies any other specific concerns. The patient is sleeping well. Their appetite is good. The patient denies other physical complaints. The patient denies AH/VH, delusional thoughts or paranoia. The patient does not appear to be responding to any internal stimuli. The patient feels that their medications have been helpful &  is in agreement to continue their current treatment regimen as recommended. The patient was able to engage in safety planning including plan to return to [facility] or contact emergency services if the patient feels unable to maintain their own safety or the safety of others. Pt had no further questions, comments, or concerns. The patient left [facility] with all personal belongings in no apparent distress.   Disposition: to home/self care   Physical Findings: AIMS: 0 Psychiatric Specialty Exam: Physical Exam Constitutional:      Appearance: the patient is not toxic-appearing.  Pulmonary:     Effort: Pulmonary effort is normal.  Neurological:     General: No focal deficit present.      Mental Status: the patient is alert and oriented to person, place, and time.   Review of Systems  Respiratory:  Negative for shortness of breath.   Cardiovascular:  Negative for chest pain.  Gastrointestinal:  Negative for abdominal pain, constipation, diarrhea, nausea and vomiting.  Neurological:  Negative for headaches.      BP (!) 112/93 (BP Location: Right Arm)   Pulse (!) 102   Temp 98.4 F (36.9 C) (Oral)   Resp 16   Ht 5' 7 (1.702 m)   Wt 80.7 kg   SpO2 100%   BMI 27.88 kg/m   General Appearance: Fairly Groomed  Eye Contact:  Good  Speech:  Clear and Coherent  Volume:  Normal  Mood:  Euthymic  Affect:  Congruent  Thought Process:  Coherent  Orientation:  Full (Time, Place, and Person)  Thought Content: Logical   Suicidal Thoughts:  No  Homicidal Thoughts:  No  Memory:  Immediate;   Good  Judgement:  fair  Insight:  fair  Psychomotor Activity:  Normal  Concentration:  Concentration: Good  Recall:  Good  Fund of Knowledge: Good  Language: Good  Akathisia:  No  Handed:  not assessed  AIMS (if indicated): not done  Assets:  Communication Skills Desire for Improvement Financial Resources/Insurance Housing Leisure Time Physical Health  ADL's:  Intact  Cognition: WNL  Sleep:  Fair      Social History   Tobacco Use  Smoking Status Former   Types: Cigarettes, Cigars  Smokeless Tobacco Never   Tobacco Cessation:  A prescription for an FDA-approved tobacco cessation medication provided at discharge   Blood Alcohol level:  Lab Results  Component Value Date   [facility] <15 11/03/2023   ETH <10 10/24/2022    Metabolic Disorder Labs:  Lab Results  Component Value Date   HGBA1C 4.9 11/03/2023   MPG 93.93 11/03/2023   MPG 117 06/27/2022   No results found for: PROLACTIN Lab Results  Component Value Date   CHOL 168 11/03/2023   TRIG 73 11/03/2023   HDL 47 11/03/2023   CHOLHDL 3.6 11/03/2023   VLDL 15 11/03/2023   LDLCALC 106 (H) 11/03/2023    LDLCALC 100 (H) 07/22/2023    See Psychiatric Specialty Exam and Suicide Risk Assessment completed by Attending Physician prior to discharge.  Discharge destination: self-care  Is patient on multiple antipsychotic therapies at discharge:  no Has Patient had three or more failed trials of antipsychotic monotherapy by history:  no  Recommended Plan for Multiple Antipsychotic Therapies: NA  Discharge Instructions     Diet - low sodium heart healthy   Complete by: As directed    Increase activity slowly   Complete by: As directed       Allergies as of 11/06/2023   No Known  Allergies      Medication List     TAKE these medications      Indication  FLUoxetine  20 MG capsule Commonly known as: PROZAC  Take 1 capsule (20 mg total) by mouth daily. What changed:  medication strength how much to take  Indication: Major Depressive Disorder   hydrOXYzine  25 MG tablet Commonly known as: ATARAX  Take 1 tablet (25 mg total) by mouth 3 (three) times daily as needed for anxiety.  Indication: Feeling Anxious   nicotine  polacrilex 2 MG gum Commonly known as: NICORETTE Take 1 each (2 mg total) by mouth as needed for smoking cessation.  Indication: Nicotine  Addiction   OLANZapine  15 MG tablet Commonly known as: ZYPREXA  Take 1 tablet (15 mg total) by mouth at bedtime. What changed:  medication strength additional instructions  Indication: Hypomanic Episode of Bipolar Disorder   traZODone  50 MG tablet Commonly known as: DESYREL  Take 1 tablet (50 mg total) by mouth at bedtime.  Indication: Trouble Sleeping        Follow-up Information     [facility] Follow up on 11/12/2023.   Why: You have a hospital follow up appointment appointment for therapy and medication management services on 11/12/23 at 3:30pm . Contact information: [address]  [address] [place] [address] [phone]         [facility] / Disability Assistance Program / [facility] Follow up.    Why: A referral was sent on 11/05/2023. Please contact this offce on 11/07/2023 at 9 AM for an update on the status of the referral Contact information: [address] [place], [place] [address] [phone]                Follow-up recommendations:  Activity as tolerated. Diet as recommended by PCP. Keep all scheduled follow-up appointments as recommended.  Patient is instructed to take all prescribed medications as recommended. Report any side effects or adverse reactions to your outpatient psychiatrist. Patient is instructed to abstain from alcohol and illegal drugs while on prescription medications. In the event of worsening symptoms, patient is instructed to call the crisis hotline, 911, or go to the nearest emergency department for evaluation and treatment.  Prescriptions given at discharge. Patient agreeable to plan. Given opportunity to ask questions. Appears to feel comfortable with discharge.  Patient is also instructed prior to discharge to: Take all medications as prescribed by mental healthcare provider. Report any adverse effects and or reactions from the medicines to outpatient provider promptly. Patient has been instructed & cautioned: To not engage in alcohol and or illegal drug use while on prescription medicines. In the event of worsening symptoms,  patient is instructed to call the crisis hotline, 911 and or go to the nearest ED for appropriate evaluation and treatment of symptoms. To follow-up with primary care provider for other medical issues, concerns and or health care needs  The patient was evaluated each day by a clinical provider to ascertain response to treatment. Improvement was noted by the patient's report of decreasing symptoms, improved sleep and appetite, affect, medication tolerance, behavior, and participation in unit programming.  Patient was asked each day to complete a self inventory noting mood, mental status, pain, new symptoms, anxiety and  concerns.  Patient responded well to medication and being in a therapeutic and supportive environment. Positive and appropriate behavior was noted and the patient was motivated for recovery. The patient worked closely with the treatment team and case manager to develop a discharge plan with appropriate goals. Coping skills, problem  solving as well as relaxation therapies were also part of the unit programming.  By the day of discharge patient was in much improved condition than upon admission.  Symptoms were reported as significantly decreased or resolved completely. The patient was motivated to continue taking medication with a goal of continued improvement in mental health.    Comments:  As above  Signed: Karleen Kaufmann, MD PGY-4

## 2023-11-06 NOTE — Progress Notes (Signed)
   11/06/23 0900  Psych Admission Type (Psych Patients Only)  Admission Status Voluntary  Psychosocial Assessment  Patient Complaints Depression  Eye Contact Brief  Facial Expression Flat  Affect Depressed  Speech Logical/coherent  Interaction Assertive  Motor Activity Slow  Appearance/Hygiene In scrubs  Behavior Characteristics Cooperative  Mood Depressed  Thought Process  Coherency WDL  Content WDL  Delusions None reported or observed  Perception WDL  Hallucination None reported or observed  Judgment Poor  Confusion None  Danger to Self  Current suicidal ideation? Denies  Description of Suicide Plan No Plan  Agreement Not to Harm Self Yes  Description of Agreement Verbal  Danger to Others  Danger to Others None reported or observed

## 2023-11-06 NOTE — Progress Notes (Signed)
 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   Jaelen Soth, LCSWA 11/06/2023

## 2023-11-06 NOTE — Group Note (Signed)
 Date:  11/06/2023 Time:  5:43 AM  Group Topic/Focus:  Wrap-Up Group:   The focus of this group is to help patients review their daily goal of treatment and discuss progress on daily workbooks.    Additional Comments:  Pt was encouraged, but opted out of attending wrap up group this evening.  Rosella DELENA Pouch 11/06/2023, 5:43 AM

## 2023-11-06 NOTE — Progress Notes (Signed)
 Pt discharged to lobby. Pt was stable and appreciative at that time. All papers and prescriptions were given and valuables returned. Verbal understanding expressed. Denies SI/HI and A/VH. Pt given opportunity to express concerns and ask questions.

## 2023-11-06 NOTE — [facility] Suicide Risk Assessment (Signed)
 [facility] Discharge Suicide Risk Assessment   Principal Problem: Bipolar disorder, unspecified (HCC) Discharge Diagnoses: Principal Problem:   Bipolar disorder, unspecified (HCC) Active Problems:   Cannabis use disorder, severe, in controlled environment (HCC)   Opioid use disorder, severe, in controlled environment (HCC)   Substance induced mood disorder (HCC)   Illicit drug use    Total Time spent with patient: 15 min   During the patient's hospitalization, patient had extensive initial psychiatric evaluation, and follow-up psychiatric evaluations every day.  Upon evaluation, psychiatric diagnoses were given as follows:  ## Drug-induced mood disorder (HCC)  # Unspecified bipolar disorder  # Severe tetrahydrocannabinol (THC) dependence  # Opiate abuse, continuous  Patient's psychiatric medications were adjusted on admission:  Restart Zyprexa  15 mg nightly Restart Prozac  10 mg daily Patient had been off of these medications for about a week prior to hospitalization  During the hospitalization, other adjustments were made to the patient's psychiatric medication regimen:  Increase Prozac  to 20 mg daily  Gradually, patient started adjusting to milieu.   Patient's care was discussed during the interdisciplinary team meeting every day during the hospitalization.  The patient denied having side effects to prescribed psychiatric medication.  The patient reports their target psychiatric symptoms responded well to the psychiatric medications, and the patient reports overall benefit other psychiatric hospitalization. Supportive psychotherapy was provided to the patient. The patient also participated in regular group therapy while admitted.   Labs were reviewed with the patient, and abnormal results were discussed with the patient.  The patient denied having suicidal thoughts more than 48 hours prior to discharge.  Patient denies having homicidal thoughts.  Patient denies having auditory  hallucinations.  Patient denies any visual hallucinations.  Patient denies having paranoid thoughts.  The patient is able to verbalize their individual safety plan to this provider.  It is recommended to the patient to continue psychiatric medications as prescribed, after discharge from the hospital.    It is recommended to the patient to follow up with your outpatient psychiatric provider and PCP.  Discussed with the patient, the impact of alcohol, drugs, tobacco have been there overall psychiatric and medical wellbeing, and total abstinence from substance use was recommended the patient.    Psychiatric Specialty Exam: Physical Exam Constitutional:      Appearance: the patient is not toxic-appearing.  Pulmonary:     Effort: Pulmonary effort is normal.  Neurological:     General: No focal deficit present.     Mental Status: the patient is alert and oriented to person, place, and time.   Review of Systems  Respiratory:  Negative for shortness of breath.   Cardiovascular:  Negative for chest pain.  Gastrointestinal:  Negative for abdominal pain, constipation, diarrhea, nausea and vomiting.  Neurological:  Negative for headaches.      BP (!) 112/93 (BP Location: Right Arm)   Pulse (!) 102   Temp 98.4 F (36.9 C) (Oral)   Resp 16   Ht 5' 7 (1.702 m)   Wt 80.7 kg   SpO2 100%   BMI 27.88 kg/m   General Appearance: Fairly Groomed  Eye Contact:  Good  Speech:  Clear and Coherent  Volume:  Normal  Mood:  Euthymic  Affect:  Congruent  Thought Process:  Coherent  Orientation:  Full (Time, Place, and Person)  Thought Content: Logical   Suicidal Thoughts:  No  Homicidal Thoughts:  No  Memory:  Immediate;   Good  Judgement:  fair  Insight:  fair  Psychomotor Activity:  Normal  Concentration:  Concentration: Good  Recall:  Good  Fund of Knowledge: Good  Language: Good  Akathisia:  No  Handed:  not assessed  AIMS (if indicated): not done  Assets:  Communication  Skills Desire for Improvement Financial Resources/Insurance Housing Leisure Time Physical Health  ADL's:  Intact  Cognition: WNL  Sleep:  Fair     Mental Status Per Nursing Assessment::   On Admission:  Self-harm thoughts  Demographic Factors:  NA  Loss Factors: NA  Historical Factors: NA  Risk Reduction Factors:   Positive social support Coping skills Good therapeutic relationship  Continued Clinical Symptoms:  Depression  Cognitive Features That Contribute To Risk:  None  Suicide Risk:  Mild: Suicidal ideation of limited frequency, intensity, duration, and specificity.  There are no identifiable plans, no associated intent, mild dysphoria and related symptoms, good self-control (both objective and subjective assessment), few other risk factors, and identifiable protective factors, including available and accessible social support.   Follow-up Information     Services, [facility] Follow up.   Why: Referral made Contact information: [address] [place] [address] [phone]         [facility] Follow up.   Specialty: Addiction Medicine Why: Referral made Contact information: [address] [place] [address] [phone]         [facility] Follow up on 11/12/2023.   Why: You have a hospital follow up appointment appointment for therapy and medication management services on 11/12/23 at 3:30pm . Contact information: [address]  [address] [place] [address] [phone]                 Plan Of Care/Follow-up recommendations:  Activity as tolerated. Diet as recommended by PCP. Keep all scheduled follow-up appointments as recommended.  Patient is instructed to take all prescribed medications as recommended. Report any side effects or adverse reactions to your outpatient psychiatrist. Patient is instructed to abstain from alcohol and illegal drugs while on prescription  medications. In the event of worsening symptoms, patient is instructed to call the crisis hotline, 911, or go to the nearest emergency department for evaluation and treatment.  Prescriptions given at discharge. Patient agreeable to plan. Given opportunity to ask questions. Appears to feel comfortable with discharge.  Patient is also instructed prior to discharge to: Take all medications as prescribed by mental healthcare provider. Report any adverse effects and or reactions from the medicines to outpatient provider promptly. Patient has been instructed & cautioned: To not engage in alcohol and or illegal drug use while on prescription medicines. In the event of worsening symptoms,  patient is instructed to call the crisis hotline, 911 and or go to the nearest ED for appropriate evaluation and treatment of symptoms. To follow-up with primary care provider for other medical issues, concerns and or health care needs  The patient was evaluated each day by a clinical provider to ascertain response to treatment. Improvement was noted by the patient's report of decreasing symptoms, improved sleep and appetite, affect, medication tolerance, behavior, and participation in unit programming.  Patient was asked each day to complete a self inventory noting mood, mental status, pain, new symptoms, anxiety and concerns.  Patient responded well to medication and being in a therapeutic and supportive environment. Positive and appropriate behavior was noted and the patient was motivated for recovery. The patient worked closely with the treatment team and case manager to develop a discharge plan with  appropriate goals. Coping skills, problem solving as well as relaxation therapies were also part of the unit programming.  By the day of discharge patient was in much improved condition than upon admission.  Symptoms were reported as significantly decreased or resolved completely. The patient was motivated to continue taking  medication with a goal of continued improvement in mental health.     Karleen Kaufmann, MD PGY-4

## 2023-11-06 NOTE — Progress Notes (Addendum)
  [facility] Adult Case Management Discharge Plan :  Will you be returning to the same living situation after discharge:  Yes,  patient is unhoused.  Patient will be discharged to her grandmother, Jaclyn Owens At discharge, do you have transportation home?: Yes,  patient's grandmother, Jaclyn Owens, [phone] will pick up patient at 58 AM Do you have the ability to pay for your medications: Yes,  patient has insurance  Release of information consent forms completed and in the chart;  Patient's signature needed at discharge.  Patient to Follow up at:  Follow-up Information     [facility] Follow up on 11/12/2023.   Why: You have a hospital follow up appointment appointment for therapy and medication management services on 11/12/23 at 3:30pm . Contact information: [address]  [address] [place] [address] [phone]         [facility] Disability Assistance Program / [facility] Follow up.   Why: A referral was sent on 11/05/2023. Please contact this offce on 11/07/2023 at 9 AM for an update on the status of the referral Contact information: [address] [place], [place] [address] [phone]                Next level of care provider has access to [facility] Link:no  Safety Planning and Suicide Prevention discussed: Yes,  Jaclyn Owens (grandmother) [phone]  Has patient been referred to the Quitline?: Patient refused referral for treatment.  Patient said that she quit smoking a month ago, and said that she doesn't need a referral for treatment (Quitline).  Patient said she will use nicotine  gum.  Patient has been referred for addiction treatment: At admission, patient tested positive for marijuana.  Patient has a history of opioid use.  Patient refused referral for treatment.  Patient said that she doesn't want to participate in substance use treatment at this time.   Jaclyn Owens O Jaclyn Owens, LCSWA 11/06/2023, 8:53 AM

## 2023-11-06 NOTE — Plan of Care (Signed)
   Problem: Education: Goal: Emotional status will improve Outcome: Progressing Goal: Mental status will improve Outcome: Progressing   Problem: Activity: Goal: Sleeping patterns will improve Outcome: Progressing

## 2023-11-06 NOTE — Progress Notes (Signed)
(  Sleep Hours) - 6 (Any PRNs that were needed, meds refused, or side effects to meds)- atarax  25mg , no meds refused, no side effects to meds (Any disturbances and when (visitation, over night)- n/a  (Concerns raised by the patient)- n/a  (SI/HI/AVH)- denies
